# Patient Record
Sex: Male | Born: 1935 | Race: White | Hispanic: No | Smoking: Former smoker
Health system: Southern US, Community
[De-identification: ages and names within clinical notes are randomized; demographics above are authoritative.]

## PROBLEM LIST (undated history)

## (undated) DIAGNOSIS — Z9889 Other specified postprocedural states: Secondary | ICD-10-CM

## (undated) DIAGNOSIS — M1990 Unspecified osteoarthritis, unspecified site: Secondary | ICD-10-CM

## (undated) DIAGNOSIS — C801 Malignant (primary) neoplasm, unspecified: Secondary | ICD-10-CM

## (undated) DIAGNOSIS — R413 Other amnesia: Secondary | ICD-10-CM

## (undated) DIAGNOSIS — K759 Inflammatory liver disease, unspecified: Secondary | ICD-10-CM

## (undated) DIAGNOSIS — R112 Nausea with vomiting, unspecified: Secondary | ICD-10-CM

## (undated) DIAGNOSIS — C61 Malignant neoplasm of prostate: Secondary | ICD-10-CM

## (undated) DIAGNOSIS — E039 Hypothyroidism, unspecified: Secondary | ICD-10-CM

## (undated) DIAGNOSIS — K219 Gastro-esophageal reflux disease without esophagitis: Secondary | ICD-10-CM

## (undated) HISTORY — PX: HERNIA REPAIR: SHX51

## (undated) HISTORY — DX: Other amnesia: R41.3

## (undated) HISTORY — PX: JOINT REPLACEMENT: SHX530

## (undated) HISTORY — PX: COLONOSCOPY: SHX174

## (undated) HISTORY — PX: HYDROCELE EXCISION: SHX482

---

## 2003-01-05 ENCOUNTER — Ambulatory Visit (HOSPITAL_BASED_OUTPATIENT_CLINIC_OR_DEPARTMENT_OTHER): Admission: RE | Admit: 2003-01-05 | Discharge: 2003-01-05 | Payer: Self-pay | Admitting: Urology

## 2003-01-05 ENCOUNTER — Encounter: Payer: Self-pay | Admitting: Urology

## 2005-01-30 ENCOUNTER — Ambulatory Visit (HOSPITAL_COMMUNITY): Admission: RE | Admit: 2005-01-30 | Discharge: 2005-01-30 | Payer: Self-pay | Admitting: Internal Medicine

## 2005-01-30 ENCOUNTER — Encounter (INDEPENDENT_AMBULATORY_CARE_PROVIDER_SITE_OTHER): Payer: Self-pay | Admitting: Internal Medicine

## 2005-01-30 ENCOUNTER — Ambulatory Visit: Payer: Self-pay | Admitting: Internal Medicine

## 2006-01-09 ENCOUNTER — Ambulatory Visit (HOSPITAL_COMMUNITY): Admission: RE | Admit: 2006-01-09 | Discharge: 2006-01-09 | Payer: Self-pay | Admitting: Family Medicine

## 2006-12-11 ENCOUNTER — Ambulatory Visit (HOSPITAL_COMMUNITY): Admission: RE | Admit: 2006-12-11 | Discharge: 2006-12-11 | Payer: Self-pay | Admitting: Family Medicine

## 2008-10-21 ENCOUNTER — Emergency Department (HOSPITAL_COMMUNITY): Admission: EM | Admit: 2008-10-21 | Discharge: 2008-10-21 | Payer: Self-pay | Admitting: Emergency Medicine

## 2010-02-15 ENCOUNTER — Encounter (INDEPENDENT_AMBULATORY_CARE_PROVIDER_SITE_OTHER): Payer: Self-pay | Admitting: *Deleted

## 2010-07-12 NOTE — Letter (Signed)
Summary: Recall, Screening Colonoscopy Only  [facility]  [address]   [place], [place] [address]   Phone: [phone]  Fax: [phone]    February 15, 2010  Evan Miller [address] [place], [place]  [address] 12-24-1925   Dear Evan Miller,   Our records indicate it is time to schedule your colonoscopy.    Please call our office at [phone] and ask for the nurse.   Thank you,    Hendricks Limes, LPN Cloria Spring, LPN  [facility] Ph: [phone]   Fax: [phone]

## 2010-10-26 NOTE — Op Note (Signed)
Evan Miller, Evan Miller                       ACCOUNT NO.:  [id number]   MEDICAL RECORD NO.:  [id number]                   PATIENT TYPE:  AMB   LOCATION:  [facility]                                 FACILITY:  [facility]   PHYSICIAN:  Valetta Fuller, M.D.               DATE OF BIRTH:  09-Jun-1926   DATE OF PROCEDURE:  01/05/2003  DATE OF DISCHARGE:                                 OPERATIVE REPORT   PREOPERATIVE DIAGNOSIS:  Left hydrocele.   POSTOPERATIVE DIAGNOSIS:  Left hydrocele.   PROCEDURE:  Left hydrocelectomy.   SURGEON:  Valetta Fuller, M.D.   ASSISTANT:  Susanne Borders, MD   ANESTHESIA:  General endotracheal.   ESTIMATED BLOOD LOSS:  Minimal.   COMPLICATIONS:  None.   INDICATION FOR PROCEDURE:  Mr. Needs is a 77 year old with a history of  right hydrocele, status post right hydrocelectomy several years prior.  He  was noted at that time to have a very small left hydrocele; however, this  hydrocele has increased markedly in size over the past few years and is now  bothersome to the patient.  He wishes to undergo left hydrocelectomy for  repair of this problem.   DESCRIPTION OF PROCEDURE:  The patient was brought to the operating room and  correctly identified by his identification bracelet.  He was given  preoperative antibiotics and placed in the supine position.  He was shaved,  prepped, and draped in typical sterile fashion.  The skin knife was used to  make an incision in the median raphe.  The Bovie electrocautery was used to  dissect through the layers of the scrotum down to the tunica vaginalis.  With blunt dissection and using the surgeon's finger, the space between the  tunica and the scrotal layers was created, allowing the large hydrocele to  be delivered through the scrotal wound.  The tunica vaginalis was then  incised with the knife and the fluid contents removed with a sucker.  There  was much redundancy of the tunica vaginalis, and some of this tissue was  excised with Bovie electrocautery.  The tunica was then wrapped around the  scrotum and tacked in place such that the potential space for hydrocele  fluid collect was no longer present.  The dartos tissue of the scrotum was  then carefully inspected for sites of bleeding, and these were all  coagulated meticulously.  Once no further bleeding was noted, the testicle  was placed back within the scrotum.  The dartos tissue was closed with a  running 4-0 Vicryl, and the skin was also closed with subcuticular running 4-  0 Vicryl.  A Tegaderm was applied, and the patient was awakened without  complications.  He was taken to the postanesthesia care unit in stable  condition.  Valetta Fuller, M.D. was the primary surgeon and participated  in all aspects of this case.    Susanne Borders, MD  Valetta Fuller, M.D.   DR/MEDQ  D:  01/05/2003  T:  01/05/2003  Job:  [id number]

## 2010-10-26 NOTE — Op Note (Signed)
NAMEHASAN, DOUSE             ACCOUNT NO.:  [id number]   MEDICAL RECORD NO.:  [id number]          PATIENT TYPE:  AMB   LOCATION:  DAY                           FACILITY:  [facility]   PHYSICIAN:  Lionel December, M.D.    DATE OF BIRTH:  [birth year]   DATE OF PROCEDURE:  01/30/2005  DATE OF DISCHARGE:                                 OPERATIVE REPORT   PROCEDURE:  Colonoscopy.   INDICATION:  Mr. Dobie is a 77 year old Caucasian male who is here for  screening colonoscopy. Family history is negative for colorectal carcinoma.  Procedure and risks were reviewed with the patient, and informed consent was  obtained.   PREMEDICATION:  Demerol 25 mg IV, Versed 2 mg IV.   FINDINGS:  Procedure performed in endoscopy suite. The patient's vital signs  and O2 saturation were monitored during the procedure and remained stable.  The patient was placed in left lateral position. Rectal examination  performed. No abnormality noted on external or digital exam. Olympus  videoscope was placed in rectum and advanced under vision into sigmoid colon  where a few small scattered diverticula were noted. Preparation was  satisfactory. Scope was passed into hepatic flexure which was quite  redundant, but turning the patient on his back and using pressure, I was  able to pass the scope into cecum which was identified by appendiceal  orifice and ileocecal valve. Pictures taken for the record. As the scope was  withdrawn, colonic mucosa was once again carefully examined. There was a  single polyp just above ileocecal valve on a fold which was ablated via cold  biopsy. Mucosa of the rest of the colon was normal. Rectal mucosa similarly  was normal. Scope was retroflexed to examine anorectal junction ,and small  hemorrhoids were noted below the dentate line. Endoscope was straightened  and withdrawn. The patient tolerated the procedure well.   FINAL DIAGNOSIS:  1.  Small polyp ablated via cold biopsy from the  ascending colon.  2.  Few diverticula at sigmoid colon.  3.  Small external hemorrhoids.   RECOMMENDATIONS:  1.  High-fiber diet. Citrucel 1 tablespoonful daily if he does not stay on a      high-fiber diet.  2.  He can resume his aspirin today.  3.  I will be contacting the patient with biopsy results and further      recommendations.      Lionel December, M.D.  Electronically Signed     NR/MEDQ  D:  01/30/2005  T:  01/30/2005  Job:  [id number]   cc:   Angus G. Renard Matter, MD  [address]  [place]  [place] [address]  Fax: [phone]

## 2010-10-29 ENCOUNTER — Ambulatory Visit (HOSPITAL_COMMUNITY)
Admission: RE | Admit: 2010-10-29 | Discharge: 2010-10-29 | Disposition: A | Discharge status: Home / Self Care | Payer: Medicare Other | Source: Ambulatory Visit | Attending: Family Medicine | Admitting: Family Medicine

## 2010-10-29 ENCOUNTER — Other Ambulatory Visit (HOSPITAL_COMMUNITY): Payer: Self-pay | Admitting: Family Medicine

## 2010-10-29 DIAGNOSIS — M1990 Unspecified osteoarthritis, unspecified site: Secondary | ICD-10-CM

## 2010-10-29 DIAGNOSIS — M1710 Unilateral primary osteoarthritis, unspecified knee: Secondary | ICD-10-CM | POA: Insufficient documentation

## 2010-10-29 DIAGNOSIS — M25569 Pain in unspecified knee: Secondary | ICD-10-CM | POA: Insufficient documentation

## 2010-10-29 DIAGNOSIS — G8929 Other chronic pain: Secondary | ICD-10-CM

## 2010-10-29 DIAGNOSIS — IMO0002 Reserved for concepts with insufficient information to code with codable children: Secondary | ICD-10-CM | POA: Insufficient documentation

## 2011-02-21 ENCOUNTER — Encounter (HOSPITAL_COMMUNITY): Payer: Medicare Other

## 2011-02-21 ENCOUNTER — Other Ambulatory Visit: Payer: Self-pay | Admitting: Orthopedic Surgery

## 2011-02-21 LAB — URINALYSIS, ROUTINE W REFLEX MICROSCOPIC
Bilirubin Urine: NEGATIVE
Glucose, UA: NEGATIVE mg/dL
Hgb urine dipstick: NEGATIVE
Ketones, ur: NEGATIVE mg/dL
Nitrite: NEGATIVE
Specific Gravity, Urine: 1.02 (ref 1.005–1.030)
pH: 5 (ref 5.0–8.0)

## 2011-02-21 LAB — COMPREHENSIVE METABOLIC PANEL
ALT: 36 U/L (ref 0–53)
AST: 34 U/L (ref 0–37)
CO2: 30 mEq/L (ref 19–32)
Calcium: 9.9 mg/dL (ref 8.4–10.5)
Chloride: 101 mEq/L (ref 96–112)
Creatinine, Ser: 0.95 mg/dL (ref 0.50–1.35)
GFR calc Af Amer: >60 mL/min (ref >60)
GFR calc non Af Amer: >60 mL/min (ref >60)
Glucose, Bld: 100 mg/dL — ABNORMAL HIGH (ref 70–99)
Sodium: 138 mEq/L (ref 135–145)
Total Bilirubin: 0.4 mg/dL (ref 0.3–1.2)

## 2011-02-21 LAB — CBC
Hemoglobin: 14.5 g/dL (ref 13.0–17.0)
MCH: 30.7 pg (ref 26.0–34.0)
MCV: 90.5 fL (ref 78.0–100.0)
RBC: 4.73 MIL/uL (ref 4.22–5.81)
WBC: 9.3 10*3/uL (ref 4.0–10.5)

## 2011-02-21 LAB — APTT: aPTT: 35 seconds (ref 24–37)

## 2011-02-21 LAB — SURGICAL PCR SCREEN: Staphylococcus aureus: NEGATIVE

## 2011-02-25 ENCOUNTER — Inpatient Hospital Stay (HOSPITAL_COMMUNITY)
Admission: RE | Admit: 2011-02-25 | Discharge: 2011-02-28 | DRG: 470 | Disposition: A | Discharge status: Skilled Nursing Facility | Payer: Medicare Other | Source: Ambulatory Visit | Attending: Orthopedic Surgery | Admitting: Orthopedic Surgery

## 2011-02-25 DIAGNOSIS — E7800 Pure hypercholesterolemia, unspecified: Secondary | ICD-10-CM | POA: Diagnosis present

## 2011-02-25 DIAGNOSIS — Z85828 Personal history of other malignant neoplasm of skin: Secondary | ICD-10-CM

## 2011-02-25 DIAGNOSIS — M21169 Varus deformity, not elsewhere classified, unspecified knee: Secondary | ICD-10-CM | POA: Diagnosis present

## 2011-02-25 DIAGNOSIS — D62 Acute posthemorrhagic anemia: Secondary | ICD-10-CM | POA: Diagnosis not present

## 2011-02-25 DIAGNOSIS — Z8546 Personal history of malignant neoplasm of prostate: Secondary | ICD-10-CM

## 2011-02-25 DIAGNOSIS — Z01812 Encounter for preprocedural laboratory examination: Secondary | ICD-10-CM

## 2011-02-25 DIAGNOSIS — B029 Zoster without complications: Secondary | ICD-10-CM | POA: Diagnosis present

## 2011-02-25 DIAGNOSIS — H409 Unspecified glaucoma: Secondary | ICD-10-CM | POA: Diagnosis present

## 2011-02-25 DIAGNOSIS — Z7982 Long term (current) use of aspirin: Secondary | ICD-10-CM

## 2011-02-25 DIAGNOSIS — Z85820 Personal history of malignant melanoma of skin: Secondary | ICD-10-CM

## 2011-02-25 DIAGNOSIS — Z880 Allergy status to penicillin: Secondary | ICD-10-CM

## 2011-02-25 DIAGNOSIS — M1710 Unilateral primary osteoarthritis, unspecified knee: Principal | ICD-10-CM | POA: Diagnosis present

## 2011-02-25 DIAGNOSIS — E039 Hypothyroidism, unspecified: Secondary | ICD-10-CM | POA: Diagnosis present

## 2011-02-25 DIAGNOSIS — Z79899 Other long term (current) drug therapy: Secondary | ICD-10-CM

## 2011-02-26 LAB — CBC
HCT: 32.7 % — ABNORMAL LOW (ref 39.0–52.0)
Hemoglobin: 11.1 g/dL — ABNORMAL LOW (ref 13.0–17.0)
MCH: 30.4 pg (ref 26.0–34.0)
MCV: 89.6 fL (ref 78.0–100.0)
RBC: 3.65 MIL/uL — ABNORMAL LOW (ref 4.22–5.81)

## 2011-02-26 LAB — BASIC METABOLIC PANEL
CO2: 27 mEq/L (ref 19–32)
Calcium: 8.5 mg/dL (ref 8.4–10.5)
Creatinine, Ser: 0.81 mg/dL (ref 0.50–1.35)
Glucose, Bld: 116 mg/dL — ABNORMAL HIGH (ref 70–99)
Sodium: 138 mEq/L (ref 135–145)

## 2011-02-27 LAB — CBC
MCH: 30.6 pg (ref 26.0–34.0)
MCV: 89.7 fL (ref 78.0–100.0)
Platelets: 233 10*3/uL (ref 150–400)
RBC: 3.89 MIL/uL — ABNORMAL LOW (ref 4.22–5.81)

## 2011-02-27 LAB — BASIC METABOLIC PANEL
CO2: 26 mEq/L (ref 19–32)
Calcium: 9.2 mg/dL (ref 8.4–10.5)
Creatinine, Ser: 0.94 mg/dL (ref 0.50–1.35)
GFR calc non Af Amer: >60 mL/min (ref >60)
Glucose, Bld: 129 mg/dL — ABNORMAL HIGH (ref 70–99)

## 2011-02-27 NOTE — Op Note (Signed)
Evan Miller, Evan Miller             ACCOUNT NO.:  [id number]  MEDICAL RECORD NO.:  [id number]  LOCATION:  1613                         FACILITY:  [facility]  PHYSICIAN:  Ollen Gross, M.D.    DATE OF BIRTH:  [birth year]  DATE OF PROCEDURE:  02/25/2011 DATE OF DISCHARGE:                              OPERATIVE REPORT   PREOPERATIVE DIAGNOSIS:  Osteoarthritis, left knee.  POSTOPERATIVE DIAGNOSIS:  Osteoarthritis, left knee.  PROCEDURE:  Left total knee arthroplasty.  SURGEON:  Ollen Gross, M.D.  ASSISTANT:  Alexzandrew L. Perkins, P.A.C.  ANESTHESIA:  Spinal.  ESTIMATED BLOOD LOSS:  Minimal.  DRAIN:  Hemovac x1.  TOURNIQUET TIME:  47 minutes at 300 mmHg.  COMPLICATIONS:  None.  CONDITION:  Stable to Recovery.  BRIEF CLINICAL NOTE:  Mr. Venuto is an 77 year old male with advanced end-stage arthritis of both knees with progressively worsening pain and dysfunction.  He has severe varus deformity of the left worse than the right knee with approximately 25 to 30 degrees of varus.  He has had intractable pain with bone-on-bone changes throughout the knee.  He has failed nonoperative management including injections and presents for total knee arthroplasty.  PROCEDURE IN DETAIL:  After successful administration of spinal anesthetic, a tourniquet was placed high on his left thigh, and his left lower extremity was prepped and draped in the usual sterile fashion. Extremities wrapped in Esmarch, knee flexed, tourniquet inflated to 300 mmHg.  Midline incision was made with 10 blade through subcutaneous tissue to the extensor mechanism.  A fresh blade was used to make a medial parapatellar arthrotomy.  Soft tissue on the proximal medial tibia subperiosteally elevated to the joint line with the knife and into the semimembranosus bursa with a Cobb elevator.  Soft tissue laterally is elevated with attention being paid to avoid patellar tendon on tibial tubercle.  Patella was everted,  knee flexed 90 degrees and ACL and PCL removed.  He has severe bony erosion on the medial tibia and medial femoral condyle.  Drill was used to create a starting hole on the distal femur and the canal was thoroughly irrigated.  5-degree left valgus alignment guide was placed and the distal femoral cutting block was pinned to remove 11 mm of the distal femur.  Resection was made with an oscillating saw.  Sizing guide was placed, size 5 was most appropriate. Rotations marked at the epicondylar axis and a size 5 cutting block was placed in this rotation.  The anterior, posterior, and chamfer cuts were made.  The tibia subluxed forward.  There was barely any meniscus left and what was left was removed.  There was a massive rim of osteophytes on the medial tibia, which was excised.  He has got about a 10 to 15 mm defect on the medial side.  The extramedullary tibial cutting guide was placed referencing proximally at the medial aspect of the tibial tubercle and distally along the second metatarsal axis and tibial crest.  Block is pinned to remove about 6 mm off the slightly deficient lateral side. Tibial resection was made with an oscillating saw.  There is still a massive defect medially.  I felt it would be about a centimeter defect to  get to the base of it.  I made the cut on the medial side, a centimeter below the main level of resection in order to get to the bottom of this defect.  We then prepared the proximal tibia to modular drill and keel punch for the size 5.  I prepared for the MBT revision tray with a 13 x 30 stem extension.  We then placed a trial size 5 MBT tibial with a 13 x 30 stem extension, a 10-mm medial sided augment. This had perfect fit on the cut bone surfaces.  We then finished preparation with the keel punch.  The intercondylar block was then used to complete preparation of the femoral side and the resection was made with an oscillating saw.  Trial size 5 posterior  stabilized femur was placed.  A 10-mm posterior stabilized rotating platform insert trial was placed with the 10 full extensions achieved with excellent varus-valgus and anterior-posterior balance throughout full range of motion.  The patella was everted, then the thickness measured to be 26 mm.  Freehand resection was taken 14 mm, 41 template was placed, lug holes were drilled, trial patella was placed and it tracks normally.  Osteophytes removed off the posterior femur with the trial in place.  All trials were removed.  We sized for a size 5 cement restrictor and placed in the tibial canal.  Cement was mixed and once ready for implantation, these placed into tibial canal and a tibial component is impacted and all extruded cement removed.  Femoral component and patellar component was cemented in place.  Patella was held with a clamp.  Trial 10-mm inserts placed, knee held in full extension, all extruded cement removed.  When the cement fully hardened and the permanent 10-mm posterior stabilized, rotating platform insert was placed into the tibial tray.  The wound was copiously irrigated with saline solution and the arthrotomy closed over Hemovac drain with interrupted #1 PDS.  Flexion against gravity to about a 135 degrees and the patella tracks normally.  Tourniquet released, total time of 47 minutes.  Subcu was closed with interrupted 2-0 Vicryl and subcuticular running 4-0 Monocryl.  Note that tibial component with a size 5 MBT revision tray with a 10-mm augment medially.  Once they are closed and the catheter for Marcaine pain pump was placed and pumps initiated.  Incisions cleaned and dried and Steri-Strips and bulky sterile dressing applied.  He is then placed into a knee immobilizer, awakened and transferred to Recovery in stable condition.  Please note that it was a medical necessity to have a surgical assistant for this operation in order to perform it in a safe and  expeditious manner.  Surgical assistant was necessary for retraction of vital structures and ligaments as well as proper positioning of the leg to gain ideal alignment of the implants.     Ollen Gross, M.D.     FA/MEDQ  D:  02/25/2011  T:  02/25/2011  Job:  [id number]  Electronically Signed by Ollen Gross M.D. on 02/27/2011 10:09:41 AM

## 2011-02-28 ENCOUNTER — Inpatient Hospital Stay
Admission: RE | Admit: 2011-02-28 | Discharge: 2011-03-15 | Disposition: A | Discharge status: Home / Self Care | Payer: Medicare Other | Source: Ambulatory Visit | Attending: Family Medicine | Admitting: Family Medicine

## 2011-02-28 LAB — CBC
MCH: 30.8 pg (ref 26.0–34.0)
MCV: 90.2 fL (ref 78.0–100.0)
Platelets: 202 10*3/uL (ref 150–400)
RDW: 13.2 % (ref 11.5–15.5)
WBC: 8.3 10*3/uL (ref 4.0–10.5)

## 2011-03-06 NOTE — Discharge Summary (Signed)
[name], [name]             ACCOUNT NO.:  [id number]  MEDICAL RECORD NO.:  [id number]  LOCATION:  1613                         FACILITY:  [facility]  PHYSICIAN:  Ollen Gross, M.D.    DATE OF BIRTH:  1926-02-15  DATE OF ADMISSION:  02/25/2011 DATE OF DISCHARGE:  02/28/2011                        DISCHARGE SUMMARY - REFERRING   ADMITTING DIAGNOSES: 1. Osteoarthritis, left knee. 2. History of prostate cancer. 3. Hypercholesterolemia. 4. Hypothyroidism. 5. Shingles. 6. Glaucoma. 7. History of pilonidal cyst.  DISCHARGE DIAGNOSES: 1. Osteoarthritis, left knee status post left total knee replacement     arthroplasty. 2. Mild postop acute blood loss anemia, did not require transfusion. 3. History of prostate cancer. 4. Hypercholesterolemia. 5. Hypothyroidism. 6. Shingles. 7. Glaucoma. 8. History of pilonidal cyst.  PROCEDURE:  February 25, 2011, left total knee, surgeon Dr. Lequita Halt, assistant Avel Peace PA-C, spinal anesthesia, tourniquet time 47 minutes.  CONSULTS:  None.  BRIEF HISTORY:  Evan Miller is an 77 year old male with advanced arthritis of both knees with progressive worsening pain and dysfunction, has severe varus deformity of the left worse than the right knee; approximately at 25-30 degrees of varus, he has intractable pain with bone-on-bone changes throughout, failed nonoperative management now including injection and now presents for total knee.  LABORATORY DATA:  Preop CBC showed hemoglobin of 14.5, hematocrit of 42.8, white cell count 9.3, platelets of 261.  PT/INR 13.41 with PTT of 35.  Chem-panel on admission all within normal limits.  Preop UA negative.  Blood group type A+.  Nasal swabs were negative.  Staph aureus negative for MRSA.  Serial CBCs were followed.  Hemoglobin dropped down to 11.1.  Last H and H down to 10 with a crit of 29.3. White count went up from 9.3 to 13.9, back down to 8.3.  Serial BMETs were followed for 48 hours.   Electrolytes have all remained within normal limits.  X-rays; right knee films on Oct 29, 2010 showed right knee tricompartmental osteoarthritis.  Left knee films taken on Oct 29, 2010 showed advanced arthritic changes of the left knee with questionable nondisplaced fracture with spurs at the medial margin of the medial tibial plateau.  HOSPITAL COURSE:  The patient admitted to [facility], taken to OR, underwent above-stated procedure without complication.  The patient tolerated the procedure well, later transferred to the recovery room in orthopedic floor, started on p.o. and IV analgesics for pain control following surgery, given 24 hours postop IV antibiotics.  Started on Xarelto for deep venous thrombosis prophylaxis.  Doing pretty well on the morning of day 1.  He knew he wanted to look into the [facility] for skilled facility, so we got social work involved postoperatively. He had good urine output.  Hemovac drain placed during surgery pulled without difficulty.  Started to get up out of bed on day 1 and actually was walking over 70 feet, doing very well.  Pain was well-controlled. By day 2, we changed the dressing.  He was still progressing well, walking over 70 feet.  Incision looked good.  His hemoglobin was 11.9, he was asymptomatic with this.  It was felt that if he continued well, he would be ready by the following  day.  He was seen on rounds on February 28, 2011 on postop day 3 by Dr. Lequita Halt.  The patient was doing well, no complaints, it was felt the patient would be transferred over to [facility] at that time.  DISCHARGE PLAN:  The patient will be transferred to [facility] on February 28, 2011.  DISCHARGE DIAGNOSES:  Please see above.  DISCHARGE MEDICATIONS:  Medications at the time of transfer include: 1. Xarelto 10 mg p.o. daily for 3 weeks and discontinue the Xarelto. 2. Colace 100 mg p.o. b.i.d. 3. Levothyroxine 100 mcg p.o. q.h.s. 4. Robaxin  500 mg p.o. q.6-8 hours p.r.n. spasm. 5. OxyIR 5 mg one or two tablets every 4-6 hours as needed for     moderate pain. 6. Tylenol 325 one or two every 4-6 hours as needed for mild pain,     temperature, or headache. 7. Laxative of choice. 8. Enema of choice.  DIET:  Heart-healthy diet.  ACTIVITY:  He is weightbearing as tolerated to the left lower extremity. PT and OT for gait training, ambulation, ADLs, range of motion and strengthening exercises for total knee protocol.  FOLLOWUP:  He needs to follow up with Dr. Lequita Halt in office in 2 weeks from date of surgery.  Please contact the office of [phone].  DISPOSITION:  [facility].  CONDITION UPON DISCHARGE:  Improving.     Evan Miller, P.A.C.   ______________________________ Ollen Gross, M.D.    ALP/MEDQ  D:  02/28/2011  T:  02/28/2011  Job:  [id number]  cc:   Valetta Fuller, MD Fax: [phone]  Angus G. Renard Matter, MD Fax: [phone]  Electronically Signed by Patrica Duel P.A.C. on 02/28/2011 11:37:06 AM Electronically Signed by Ollen Gross M.D. on 03/06/2011 10:33:21 AM

## 2011-03-06 NOTE — H&P (Signed)
Evan Miller, Evan Miller             ACCOUNT NO.:  [id number]  MEDICAL RECORD NO.:  [id number]  LOCATION:                               FACILITY:  [facility]  PHYSICIAN:  Ollen Gross, M.D.    DATE OF BIRTH:  09/04/1925  DATE OF ADMISSION:  02/25/2011 DATE OF DISCHARGE:                             HISTORY & PHYSICAL   CHIEF COMPLAINT:  Left knee pain.  HISTORY OF PRESENT ILLNESS:  The patient is an 77 year old male who has been seen by Dr. Lequita Halt for ongoing bilateral knee pain, the left is more symptomatic and problematic than the right.  It has gotten worse over the past year or two.  He is known to have increasing deformity. He is found in the office to have x-rays that show severe end-stage arthritis of both knees with bone on bone.  It has progressively gotten worse with time and it is interfering with his mobility and impacting his function.  It is felt he would benefit from undergoing surgical intervention.  Risks and benefits have been discussed, he elected to proceed with surgery.  He did not have any contraindications such as ongoing infection or progressive neurological disease.  ALLERGIES: 1. PENICILLIN causes welts. 2. SULFA causes the patient to break out.  CURRENT MEDICATIONS:  Levothyroxine, aspirin, diclofenac, vitamin supplement.  PAST MEDICAL HISTORY: 1. History of prostate cancer. 2. Hypercholesterolemia. 3. Hypothyroidism. 4. Shingles. 5. Glaucoma. 6. History of pilonidal cyst.  PAST SURGICAL HISTORY:  Removal of skin cancer of melanoma and basal cell skin cancers, pilonidal cyst excision, and dental extractions.  FAMILY HISTORY:  Father deceased at age 61 from heart disease.  Mother deceased at age 19.  Also family history of prostate cancer.  SOCIAL HISTORY:  Married, 3 children.  Quit smoking about 45 years ago. No alcohol.  He is retired from Systems analyst work.  REVIEW OF SYSTEMS:  GENERAL:  No fever, chills, night sweats.  NEURO: No seizure,  syncope, or paralysis.  RESPIRATORY:  No shortness of breath, productive cough, or hemoptysis.  CARDIOVASCULAR:  No chest pain, orthopnea.  GI:  No nausea, vomiting, diarrhea, or constipation. GU:  No dysuria, hematuria, or discharge.  MUSCULOSKELETAL:  Knee pain.  PHYSICAL EXAMINATION:  VITAL SIGNS:  Pulse 64, respirations 12, blood pressure 124/72 in the right arm. GENERAL:  An 77 year old, white male well nourished and well developed, no acute distress.  He is alert, oriented, and cooperative, accompanied by his wife and daughter.  His daughter is an Charity fundraiser. HEENT:  Normocephalic, atraumatic.  Pupils round, reactive.  Notable to wear glasses.  EOMs intact. NECK:  Supple with some limited extension of his neck.  No bruits appreciated. CHEST:  Clear. HEART:  Regular rate and rhythm without murmur, S1 and S2 noted. ABDOMEN:  Soft, nontender.  Bowel sounds present. RECTAL/BREAST/GENITALIA:  Not done, not pertinent to present illness. EXTREMITIES:  Left knee, severe varus deformity.  Range of motion 10 to 110, marked crepitus, no instability.  Right knee, severe varus deformity, range of motion 5 to 110, marked crepitus, no instability.  IMPRESSION:  Osteoarthritis, left knee.  PLAN:  The patient will be admitted to [facility] to undergo a left total knee  replacement arthroplasty.  Surgery will be performed by Dr. Ollen Gross.     Alexzandrew L. Julien Girt, P.A.C.   ______________________________ Ollen Gross, M.D.    ALP/MEDQ  D:  02/24/2011  T:  02/24/2011  Job:  [id number]  cc:   Angus G. Renard Matter, MD Fax: [phone]  Valetta Fuller, MD Fax: [phone]  Electronically Signed by Patrica Duel P.A.C. on 02/28/2011 11:31:41 AM Electronically Signed by Ollen Gross M.D. on 03/06/2011 10:33:18 AM

## 2011-06-06 ENCOUNTER — Encounter (HOSPITAL_COMMUNITY): Payer: Self-pay | Admitting: Pharmacy Technician

## 2011-06-15 ENCOUNTER — Other Ambulatory Visit: Payer: Self-pay | Admitting: Orthopedic Surgery

## 2011-06-15 NOTE — H&P (Signed)
Jana Half  DOB: 06/16/1925 Single / Race: White / Male  Date of Admission:  06/24/2011  Chief Complaint:  Right Knee Pain  History of Present Illness The patient is a 78 year old male who comes in today for a preoperative History and Physical. The patient is scheduled for a right total knee arthroplasty to be performed by Dr. Gus Rankin. Aluisio, MD at [facility] on 06/24/2011. Mr. Hohensee feels like the left knee is doing great. He states it is the best it has felt in quite a while. The big problem he is having with ambulating is his right knee. He has known advanced arthritis in the right knee also. He feels like that is holding him back a little. They have been treated conservatively in the past for the above stated problem and despite conservative measures, they continue to have progressive pain and severe functional limitations and dysfunction. It is felt that they would benefit from undergoing total joint replacement. He has done well with his left knee surgery. Risks and benefits of the procedure have been discussed with the patient and they elect to proceed with surgery. There are no active contraindications to surgery such as ongoing infection or rapidly progressive neurological disease.  Allergies Penicillins. Hives. Sulfanomides. Rash.  Problem List/Past Medical History of Prostate Cancer Hypothyroidism Tinnitus History of Shingles Glaucoma Osteoarthrosis NOS, lower leg (715.96) Hypercholesterolemia Impaired Hearing Tinnitus Shingles. Past History Cataract Enlarged prostate Skin Cancer  Past Surgical History Dental Extraction Surgery Skin Cancer Surgery (Basal Cell and Melanoma) Pilonidal Cyst Surgery Total Knee Replacement - Left  Family History Heart Disease. father Father. Deceased, Heart disease. Age 10 Mother. Deceased. Age 39 Cancer. Prostate Cancer  Social History Alcohol use. current drinker; drinks beer and hard  liquor; only occasionally per week Children. 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 Exercises daily; does running / walking Illicit drug use. no Living situation. Lives with spouse. live with spouse Marital status. Married. married Number of flights of stairs before winded. 2-3 Pain Contract. no Previously in rehab. no Tobacco use. Former smoker. Quit 45 years ago former smoker; smoke(d) 1/2 pack(s) per day No alcohol use  Review of Systems General:Not Present- Chills, Fever, Night Sweats, Appetite Loss, Fatigue, Feeling sick, Weight Gain and Weight Loss. Skin:Not Present- Itching, Rash, Skin Color Changes, Ulcer, Psoriasis and Change in Hair or Nails. HEENT:Present- Tinnitus, Hearing Loss and Decreased Hearing. Not Present- Sensitivity to light, Nose Bleed, Visual Loss and Ringing in the Ears. Neck:Not Present- Swollen Glands and Neck Mass. Respiratory:Not Present- Shortness of breath, Snoring, Chronic Cough and Bloody sputum. Cardiovascular:Not Present- Shortness of Breath, Chest Pain, Swelling of Extremities, Leg Cramps and Palpitations. Gastrointestinal:Present- Heartburn. Not Present- Bloody Stool, Abdominal Pain, Vomiting, Nausea and Incontinence of Stool. Male Genitourinary:Present- Urinary Retention. Not Present- Blood in Urine, Frequency, Incontinence and Nocturia. Musculoskeletal:Present- Joint Stiffness, Joint Swelling, Back Pain and Morning Stiffness. Not Present- Muscle Weakness, Muscle Pain and Joint Pain. Neurological:Not Present- Tingling, Numbness, Burning, Tremor, Headaches and Dizziness. Psychiatric:Not Present- Anxiety, Depression and Memory Loss. Endocrine:Not Present- Cold Intolerance, Heat Intolerance, Excessive hunger and Excessive Thirst. Hematology:Not Present- Abnormal Bleeding, Abnormal bruising, Anemia and Blood Clots.  Vitals Weight: 183 lb Height: 64 in Body Surface  Area: 1.94 m Body Mass Index: 31.41 kg/m Pulse: 76 (Regular) Resp.: 14 (Unlabored) BP: 120/68 (Sitting, Left Arm, Standard)  Physical Exam The physical exam findings are as follows:  General Mental Status - Alert, cooperative and good historian. General  Appearance- pleasant. Not in acute distress. Orientation- Oriented X3. Build & Nutrition- Well nourished and Well developed.  Head and Neck Head- normocephalic, atraumatic . Neck Global Assessment- supple. no bruit auscultated on the right and no bruit auscultated on the left.  Eye Pupil- Bilateral- Regular and Round. Motion- Bilateral- EOMI. Wears glasses  Chest and Lung Exam Auscultation: Breath sounds:- clear at anterior chest wall and - clear at posterior chest wall. Adventitious sounds:- No Adventitious sounds.  Cardiovascular Auscultation:Rhythm- Regular rate and rhythm. Heart Sounds- S1 WNL and S2 WNL. Murmurs & Other Heart Sounds: Murmur 1:Location- Sternal Border - Left. Timing- Mid-systolic. Grade- II/VI. Character- Crescendo/Decrescendo.  Abdomen Palpation/Percussion:Tenderness- Abdomen is non-tender to palpation. Rigidity (guarding)- Abdomen is soft. Auscultation:Auscultation of the abdomen reveals - Bowel sounds normal.  Male Genitourinary Not done, not pertinent to present illness  Musculoskeletal Evaluation of his left knee shows a well healed incision with no erythema or exudate. He has range of motion of that left knee from about 10 to 106 actively, 7 to 110 passively. His right knee shows no effusion. He has a significant varus deformity. He is tender over the medial greater than lateral knee. There is no instability about the right knee.  Assessment & Plan Osteoarthritis Right Knee  Patient is for a Right Total Knee Replacement per Dr. Ollen Gross.  He went to [facility] following his previous left total knee and will either look into that  facility again versus going home if doing very well following his surgery.  Avel Peace, PA-C

## 2011-06-17 ENCOUNTER — Encounter (HOSPITAL_COMMUNITY)
Admission: RE | Admit: 2011-06-17 | Discharge: 2011-06-17 | Disposition: A | Discharge status: Home / Self Care | Payer: Medicare Other | Source: Ambulatory Visit | Attending: Orthopedic Surgery | Admitting: Orthopedic Surgery

## 2011-06-17 ENCOUNTER — Encounter (HOSPITAL_COMMUNITY): Payer: Self-pay

## 2011-06-17 HISTORY — DX: Hypothyroidism, unspecified: E03.9

## 2011-06-17 HISTORY — DX: Unspecified osteoarthritis, unspecified site: M19.90

## 2011-06-17 HISTORY — DX: Inflammatory liver disease, unspecified: K75.9

## 2011-06-17 HISTORY — DX: Other specified postprocedural states: Z98.890

## 2011-06-17 HISTORY — DX: Malignant (primary) neoplasm, unspecified: C80.1

## 2011-06-17 HISTORY — DX: Nausea with vomiting, unspecified: R11.2

## 2011-06-17 HISTORY — DX: Gastro-esophageal reflux disease without esophagitis: K21.9

## 2011-06-17 LAB — CBC
MCH: 27.7 pg (ref 26.0–34.0)
MCHC: 32.9 g/dL (ref 30.0–36.0)
Platelets: 297 10*3/uL (ref 150–400)
RDW: 15.2 % (ref 11.5–15.5)

## 2011-06-17 LAB — URINALYSIS, ROUTINE W REFLEX MICROSCOPIC
Hgb urine dipstick: NEGATIVE
Protein, ur: NEGATIVE mg/dL
Urobilinogen, UA: 0.2 mg/dL (ref 0.0–1.0)

## 2011-06-17 LAB — TYPE AND SCREEN
ABO/RH(D): A POS
Antibody Screen: NEGATIVE

## 2011-06-17 LAB — COMPREHENSIVE METABOLIC PANEL
ALT: 13 U/L (ref 0–53)
AST: 17 U/L (ref 0–37)
Albumin: 4 g/dL (ref 3.5–5.2)
Calcium: 9.8 mg/dL (ref 8.4–10.5)
GFR calc Af Amer: >90 mL/min (ref >90)
Sodium: 139 mEq/L (ref 135–145)
Total Protein: 7.2 g/dL (ref 6.0–8.3)

## 2011-06-17 LAB — APTT: aPTT: 36 seconds (ref 24–37)

## 2011-06-17 LAB — SURGICAL PCR SCREEN: MRSA, PCR: NEGATIVE

## 2011-06-17 MED ORDER — CHLORHEXIDINE GLUCONATE 4 % EX LIQD: 60.0000 mL | Freq: Once | CUTANEOUS | Status: DC

## 2011-06-17 NOTE — Patient Instructions (Signed)
[address] [place]  06/17/2011   Your procedure is scheduled on: 06/23/10  Monday    Surgery [id number] Report to [facility] at 0725 AM.  Call this number if you have problems the morning of surgery: [phone]    Or Deliah Goody [phone]   Remember:   Do not eat food:After Midnight. Sunday night  May have clear liquids:until Midnight .Sunday night  Clear liquids include soda, tea, black coffee, apple or grape juice, broth.  Take these medicines the morning of surgery with A SIP OF WATER    Synthroid with sip water       Vicodin if needed with sip water  Do not wear jewelry, make-up or nail polish.  Do not wear lotions, powders, or perfumes. You may wear deodorant.  Do not shave 48 hours prior to surgery.  Do not bring valuables to the hospital.  Contacts, dentures or bridgework may not be worn into surgery.  Leave suitcase in the car. After surgery it may be brought to your room.  For patients admitted to the hospital, checkout time is 11:00 AM the day of discharge.   Patients discharged the day of surgery will not be allowed to drive home.  Name and phone number of your driver: son, daughter or rehab Special Instructions: CHG Shower Use Special Wash: 1/2 bottle night before surgery and 1/2 bottle morning of surgery. Regular soap face and privates.  May shave face AM of surgery   Please read over the following fact sheets that you were given: MRSA Information

## 2011-06-23 MED ORDER — BUPIVACAINE 0.25 % ON-Q PUMP SINGLE CATH 300ML
300.0000 mL | INJECTION | Status: DC
  Administered 2011-06-24: 300 mL
  Filled 2011-06-23: qty 300

## 2011-06-24 ENCOUNTER — Encounter (HOSPITAL_COMMUNITY): Payer: Self-pay | Admitting: *Deleted

## 2011-06-24 ENCOUNTER — Encounter (HOSPITAL_COMMUNITY): Payer: Self-pay | Admitting: Anesthesiology

## 2011-06-24 ENCOUNTER — Inpatient Hospital Stay (HOSPITAL_COMMUNITY)
Admission: RE | Admit: 2011-06-24 | Discharge: 2011-06-27 | DRG: 470 | Disposition: A | Discharge status: Skilled Nursing Facility | Payer: Medicare Other | Source: Ambulatory Visit | Attending: Orthopedic Surgery | Admitting: Orthopedic Surgery

## 2011-06-24 ENCOUNTER — Encounter (HOSPITAL_COMMUNITY)
Admission: RE | Disposition: A | Discharge status: Skilled Nursing Facility | Payer: Self-pay | Source: Ambulatory Visit | Attending: Orthopedic Surgery

## 2011-06-24 ENCOUNTER — Other Ambulatory Visit: Payer: Self-pay

## 2011-06-24 ENCOUNTER — Inpatient Hospital Stay (HOSPITAL_COMMUNITY): Payer: Medicare Other | Admitting: Anesthesiology

## 2011-06-24 DIAGNOSIS — E039 Hypothyroidism, unspecified: Secondary | ICD-10-CM | POA: Diagnosis present

## 2011-06-24 DIAGNOSIS — M1710 Unilateral primary osteoarthritis, unspecified knee: Principal | ICD-10-CM | POA: Diagnosis present

## 2011-06-24 DIAGNOSIS — Z01812 Encounter for preprocedural laboratory examination: Secondary | ICD-10-CM

## 2011-06-24 DIAGNOSIS — Z96659 Presence of unspecified artificial knee joint: Secondary | ICD-10-CM

## 2011-06-24 DIAGNOSIS — M179 Osteoarthritis of knee, unspecified: Secondary | ICD-10-CM | POA: Diagnosis present

## 2011-06-24 HISTORY — PX: TOTAL KNEE ARTHROPLASTY: SHX125

## 2011-06-24 SURGERY — ARTHROPLASTY, KNEE, TOTAL
Anesthesia: Spinal | Site: Knee | Laterality: Right | Wound class: Clean

## 2011-06-24 MED ORDER — PROPOFOL 10 MG/ML IV EMUL
INTRAVENOUS | Status: DC | PRN
  Administered 2011-06-24: 50 ug/kg/min via INTRAVENOUS

## 2011-06-24 MED ORDER — METOCLOPRAMIDE HCL 5 MG/ML IJ SOLN: 5.0000 mg | Freq: Three times a day (TID) | INTRAMUSCULAR | Status: DC | PRN

## 2011-06-24 MED ORDER — VANCOMYCIN HCL IN DEXTROSE 1-5 GM/200ML-% IV SOLN
1000.0000 mg | Freq: Once | INTRAVENOUS | Status: AC
  Administered 2011-06-24: 1000 mg via INTRAVENOUS

## 2011-06-24 MED ORDER — BISACODYL 10 MG RE SUPP
10.0000 mg | Freq: Every day | RECTAL | Status: DC | PRN
  Filled 2011-06-24: qty 1

## 2011-06-24 MED ORDER — MENTHOL 3 MG MT LOZG: 1.0000 | LOZENGE | OROMUCOSAL | Status: DC | PRN

## 2011-06-24 MED ORDER — ACETAMINOPHEN 650 MG RE SUPP: 650.0000 mg | Freq: Four times a day (QID) | RECTAL | Status: DC | PRN

## 2011-06-24 MED ORDER — FENTANYL CITRATE 0.05 MG/ML IJ SOLN
INTRAMUSCULAR | Status: DC | PRN
  Administered 2011-06-24 (×2): 50 ug via INTRAVENOUS

## 2011-06-24 MED ORDER — SCOPOLAMINE 1 MG/3DAYS TD PT72
MEDICATED_PATCH | TRANSDERMAL | Status: DC | PRN
  Administered 2011-06-24: 1 via TRANSDERMAL

## 2011-06-24 MED ORDER — FLEET ENEMA 7-19 GM/118ML RE ENEM: 1.0000 | ENEMA | Freq: Once | RECTAL | Status: AC | PRN

## 2011-06-24 MED ORDER — DOCUSATE SODIUM 100 MG PO CAPS
100.0000 mg | ORAL_CAPSULE | Freq: Two times a day (BID) | ORAL | Status: DC
  Administered 2011-06-25 – 2011-06-27 (×4): 100 mg via ORAL
  Filled 2011-06-24 (×7): qty 1

## 2011-06-24 MED ORDER — HYDROMORPHONE HCL PF 1 MG/ML IJ SOLN
0.5000 mg | INTRAMUSCULAR | Status: DC | PRN
  Administered 2011-06-24: 1 mg via INTRAVENOUS
  Administered 2011-06-24: 0.5 mg via INTRAVENOUS
  Administered 2011-06-25: 1 mg via INTRAVENOUS
  Filled 2011-06-24 (×3): qty 1

## 2011-06-24 MED ORDER — VANCOMYCIN HCL IN DEXTROSE 1-5 GM/200ML-% IV SOLN
1000.0000 mg | Freq: Two times a day (BID) | INTRAVENOUS | Status: AC
  Administered 2011-06-24: 1000 mg via INTRAVENOUS
  Filled 2011-06-24 (×3): qty 200

## 2011-06-24 MED ORDER — DEXTROSE-NACL 5-0.9 % IV SOLN
INTRAVENOUS | Status: DC
  Administered 2011-06-24 – 2011-06-25 (×4): via INTRAVENOUS

## 2011-06-24 MED ORDER — LACTATED RINGERS IV SOLN
INTRAVENOUS | Status: DC
  Administered 2011-06-24: 1000 mL via INTRAVENOUS
  Administered 2011-06-24: 11:00:00 via INTRAVENOUS

## 2011-06-24 MED ORDER — ONDANSETRON HCL 4 MG/2ML IJ SOLN
INTRAMUSCULAR | Status: DC | PRN
  Administered 2011-06-24: 4 mg via INTRAVENOUS

## 2011-06-24 MED ORDER — PHENOL 1.4 % MT LIQD: 1.0000 | OROMUCOSAL | Status: DC | PRN

## 2011-06-24 MED ORDER — TEMAZEPAM 15 MG PO CAPS: 15.0000 mg | ORAL_CAPSULE | Freq: Every evening | ORAL | Status: DC | PRN

## 2011-06-24 MED ORDER — ACETAMINOPHEN 10 MG/ML IV SOLN
INTRAVENOUS | Status: DC | PRN
  Administered 2011-06-24: 1000 mg via INTRAVENOUS

## 2011-06-24 MED ORDER — BUPIVACAINE IN DEXTROSE 0.75-8.25 % IT SOLN
INTRATHECAL | Status: DC | PRN
  Administered 2011-06-24: 10 mg via INTRATHECAL

## 2011-06-24 MED ORDER — RIVAROXABAN 10 MG PO TABS
10.0000 mg | ORAL_TABLET | Freq: Every day | ORAL | Status: DC
  Administered 2011-06-25 – 2011-06-27 (×3): 10 mg via ORAL
  Filled 2011-06-24 (×3): qty 1

## 2011-06-24 MED ORDER — METHOCARBAMOL 100 MG/ML IJ SOLN
500.0000 mg | Freq: Four times a day (QID) | INTRAVENOUS | Status: DC | PRN
  Administered 2011-06-24: 500 mg via INTRAVENOUS
  Filled 2011-06-24 (×2): qty 5

## 2011-06-24 MED ORDER — ACETAMINOPHEN 10 MG/ML IV SOLN
1000.0000 mg | Freq: Four times a day (QID) | INTRAVENOUS | Status: AC
  Administered 2011-06-24 – 2011-06-25 (×4): 1000 mg via INTRAVENOUS
  Filled 2011-06-24 (×5): qty 100

## 2011-06-24 MED ORDER — HYDROMORPHONE HCL PF 1 MG/ML IJ SOLN: 0.2500 mg | INTRAMUSCULAR | Status: DC | PRN

## 2011-06-24 MED ORDER — METOCLOPRAMIDE HCL 10 MG PO TABS: 5.0000 mg | ORAL_TABLET | Freq: Three times a day (TID) | ORAL | Status: DC | PRN

## 2011-06-24 MED ORDER — METHOCARBAMOL 500 MG PO TABS: 500.0000 mg | ORAL_TABLET | Freq: Four times a day (QID) | ORAL | Status: DC | PRN

## 2011-06-24 MED ORDER — BUPIVACAINE ON-Q PAIN PUMP (FOR ORDER SET NO CHG): INJECTION | Status: DC

## 2011-06-24 MED ORDER — PROMETHAZINE HCL 25 MG/ML IJ SOLN: 6.2500 mg | INTRAMUSCULAR | Status: DC | PRN

## 2011-06-24 MED ORDER — SODIUM CHLORIDE 0.9 % IR SOLN
Status: DC | PRN
  Administered 2011-06-24: 1000 mL

## 2011-06-24 MED ORDER — LIDOCAINE HCL (CARDIAC) 20 MG/ML IV SOLN
INTRAVENOUS | Status: DC | PRN
  Administered 2011-06-24: 100 mg via INTRAVENOUS

## 2011-06-24 MED ORDER — DIPHENHYDRAMINE HCL 12.5 MG/5ML PO ELIX: 12.5000 mg | ORAL_SOLUTION | ORAL | Status: DC | PRN

## 2011-06-24 MED ORDER — ACETAMINOPHEN 325 MG PO TABS: 650.0000 mg | ORAL_TABLET | Freq: Four times a day (QID) | ORAL | Status: DC | PRN

## 2011-06-24 MED ORDER — LEVOTHYROXINE SODIUM 100 MCG PO TABS
100.0000 ug | ORAL_TABLET | Freq: Every day | ORAL | Status: DC
  Administered 2011-06-25 – 2011-06-27 (×3): 100 ug via ORAL
  Filled 2011-06-24 (×3): qty 1

## 2011-06-24 MED ORDER — ONDANSETRON HCL 4 MG PO TABS: 4.0000 mg | ORAL_TABLET | Freq: Four times a day (QID) | ORAL | Status: DC | PRN

## 2011-06-24 MED ORDER — ONDANSETRON HCL 4 MG/2ML IJ SOLN: 4.0000 mg | Freq: Four times a day (QID) | INTRAMUSCULAR | Status: DC | PRN

## 2011-06-24 MED ORDER — EPHEDRINE SULFATE 50 MG/ML IJ SOLN
INTRAMUSCULAR | Status: DC | PRN
  Administered 2011-06-24 (×3): 5 mg via INTRAVENOUS

## 2011-06-24 MED ORDER — HYDROCODONE-ACETAMINOPHEN 5-325 MG PO TABS
1.0000 | ORAL_TABLET | ORAL | Status: DC | PRN
  Administered 2011-06-24: 1 via ORAL
  Administered 2011-06-25: 2 via ORAL
  Administered 2011-06-25 – 2011-06-27 (×5): 1 via ORAL
  Filled 2011-06-24 (×3): qty 1
  Filled 2011-06-24: qty 2
  Filled 2011-06-24 (×2): qty 1
  Filled 2011-06-24: qty 2

## 2011-06-24 MED ORDER — POLYETHYLENE GLYCOL 3350 17 G PO PACK
17.0000 g | PACK | Freq: Every day | ORAL | Status: DC | PRN
  Filled 2011-06-24: qty 1

## 2011-06-24 SURGICAL SUPPLY — 53 items
BAG ZIPLOCK 12X15 (MISCELLANEOUS) ×2 IMPLANT
BANDAGE ELASTIC 6 VELCRO ST LF (GAUZE/BANDAGES/DRESSINGS) ×2 IMPLANT
BANDAGE ESMARK 6X9 LF (GAUZE/BANDAGES/DRESSINGS) ×1 IMPLANT
BLADE SAG 18X100X1.27 (BLADE) ×2 IMPLANT
BLADE SAW SGTL 11.0X1.19X90.0M (BLADE) ×2 IMPLANT
BNDG ESMARK 6X9 LF (GAUZE/BANDAGES/DRESSINGS) ×2
BOWL SMART MIX CTS (DISPOSABLE) ×2 IMPLANT
CATH KIT ON-Q SILVERSOAK 5IN (CATHETERS) ×2 IMPLANT
CEMENT HV SMART SET (Cement) ×4 IMPLANT
CLOSURE STERI STRIP 1/2 X4 (GAUZE/BANDAGES/DRESSINGS) ×2 IMPLANT
CLOTH BEACON ORANGE TIMEOUT ST (SAFETY) ×2 IMPLANT
CUFF TOURN SGL QUICK 34 (TOURNIQUET CUFF) ×1
CUFF TRNQT CYL 34X4X40X1 (TOURNIQUET CUFF) ×1 IMPLANT
DRAPE EXTREMITY T 121X128X90 (DRAPE) ×2 IMPLANT
DRAPE POUCH INSTRU U-SHP 10X18 (DRAPES) ×2 IMPLANT
DRAPE U-SHAPE 47X51 STRL (DRAPES) ×2 IMPLANT
DRSG ADAPTIC 3X8 NADH LF (GAUZE/BANDAGES/DRESSINGS) ×2 IMPLANT
DURAPREP 26ML APPLICATOR (WOUND CARE) ×2 IMPLANT
ELECT REM PT RETURN 9FT ADLT (ELECTROSURGICAL) ×2
ELECTRODE REM PT RTRN 9FT ADLT (ELECTROSURGICAL) ×1 IMPLANT
EVACUATOR 1/8 PVC DRAIN (DRAIN) ×2 IMPLANT
FACESHIELD LNG OPTICON STERILE (SAFETY) ×10 IMPLANT
GAUZE SPONGE 4X4 12PLY STRL LF (GAUZE/BANDAGES/DRESSINGS) ×2 IMPLANT
GLOVE BIO SURGEON STRL SZ7.5 (GLOVE) ×2 IMPLANT
GLOVE BIO SURGEON STRL SZ8 (GLOVE) ×2 IMPLANT
GLOVE BIOGEL PI IND STRL 8 (GLOVE) ×2 IMPLANT
GLOVE BIOGEL PI INDICATOR 8 (GLOVE) ×2
GOWN STRL NON-REIN LRG LVL3 (GOWN DISPOSABLE) ×2 IMPLANT
GOWN STRL REIN XL XLG (GOWN DISPOSABLE) ×2 IMPLANT
HANDPIECE INTERPULSE COAX TIP (DISPOSABLE) ×1
IMMOBILIZER KNEE 20 (SOFTGOODS) ×2
IMMOBILIZER KNEE 20 THIGH 36 (SOFTGOODS) ×1 IMPLANT
IMMOBILIZER KNEE 22 UNIV (SOFTGOODS) ×2 IMPLANT
KIT BASIN OR (CUSTOM PROCEDURE TRAY) ×2 IMPLANT
MANIFOLD NEPTUNE II (INSTRUMENTS) ×2 IMPLANT
NS IRRIG 1000ML POUR BTL (IV SOLUTION) ×2 IMPLANT
PACK TOTAL JOINT (CUSTOM PROCEDURE TRAY) ×2 IMPLANT
PAD ABD 7.5X8 STRL (GAUZE/BANDAGES/DRESSINGS) ×2 IMPLANT
PADDING CAST ABS 6INX4YD NS (CAST SUPPLIES) ×1
PADDING CAST ABS COTTON 6X4 NS (CAST SUPPLIES) ×1 IMPLANT
PADDING CAST COTTON 6X4 STRL (CAST SUPPLIES) ×6 IMPLANT
POSITIONER SURGICAL ARM (MISCELLANEOUS) ×2 IMPLANT
SET HNDPC FAN SPRY TIP SCT (DISPOSABLE) ×1 IMPLANT
SPONGE GAUZE 4X4 12PLY (GAUZE/BANDAGES/DRESSINGS) ×2 IMPLANT
STRIP CLOSURE SKIN 1/2X4 (GAUZE/BANDAGES/DRESSINGS) ×4 IMPLANT
SUCTION FRAZIER 12FR DISP (SUCTIONS) ×2 IMPLANT
SUT MNCRL AB 4-0 PS2 18 (SUTURE) ×2 IMPLANT
SUT PDS AB 1 CT1 27 (SUTURE) ×6 IMPLANT
SUT VIC AB 2-0 CT1 27 (SUTURE) ×3
SUT VIC AB 2-0 CT1 TAPERPNT 27 (SUTURE) ×3 IMPLANT
TOWEL OR 17X26 10 PK STRL BLUE (TOWEL DISPOSABLE) ×4 IMPLANT
TRAY FOLEY CATH 14FRSI W/METER (CATHETERS) ×2 IMPLANT
WATER STERILE IRR 1500ML POUR (IV SOLUTION) ×2 IMPLANT

## 2011-06-24 NOTE — Preoperative (Signed)
Beta Blockers   Reason not to administer Beta Blockers:Not Applicable 

## 2011-06-24 NOTE — Progress Notes (Signed)
Utilization review completed.  

## 2011-06-24 NOTE — Interval H&P Note (Signed)
History and Physical Interval Note:  06/24/2011 10:03 AM  Evan Miller  has presented today for surgery, with the diagnosis of Osteoarthritis of the Right Knee  The various methods of treatment have been discussed with the patient and family. After consideration of risks, benefits and other options for treatment, the patient has consented to  Procedure(s): TOTAL KNEE ARTHROPLASTY as a surgical intervention .  The patients' history has been reviewed, patient examined, no change in status, stable for surgery.  I have reviewed the patients' chart and labs.  Questions were answered to the patient's satisfaction.     Loanne Drilling

## 2011-06-24 NOTE — Anesthesia Postprocedure Evaluation (Signed)
  Anesthesia Post-op Note  Patient: Evan Miller  Procedure(s) Performed:  TOTAL KNEE ARTHROPLASTY  Patient Location: PACU  Anesthesia Type: Spinal  Level of Consciousness: oriented and sedated  Airway and Oxygen Therapy: Patient Spontanous Breathing and Patient connected to nasal cannula oxygen  Post-op Pain: none  Post-op Assessment: Post-op Vital signs reviewed, Patient's Cardiovascular Status Stable, Respiratory Function Stable and Patent Airway  Post-op Vital Signs: stable  Complications: No apparent anesthesia complications

## 2011-06-24 NOTE — Anesthesia Preprocedure Evaluation (Signed)
Anesthesia Evaluation  Patient identified by MRN, date of birth, ID band Patient awake  General Assessment Comment:Advanced yrs  Reviewed: Allergy & Precautions, H&P , NPO status , Patient's Chart, lab work & pertinent test results, reviewed documented beta blocker date and time   History of Anesthesia Complications (+) PONV  Airway Mallampati: II TM Distance: >3 FB Neck ROM: Full    Dental  (+) Upper Dentures and Lower Dentures   Pulmonary neg pulmonary ROS, former smoker clear to auscultation        Cardiovascular neg cardio ROS Regular Normal Denies cardiac symptoms   Neuro/Psych Negative Neurological ROS  Negative Psych ROS   GI/Hepatic negative GI ROS, (+) Hepatitis -, AAs a child- 10 yrs old   Endo/Other  Hypothyroidism Thyroid replacement  Renal/GU negative Renal ROS   Hx prostate cancer    Musculoskeletal negative musculoskeletal ROS (+)   Abdominal   Peds negative pediatric ROS (+)  Hematology negative hematology ROS (+)   Anesthesia Other Findings   Reproductive/Obstetrics negative OB ROS                           Anesthesia Physical Anesthesia Plan  ASA: II  Anesthesia Plan: Spinal   Post-op Pain Management:    Induction:   Airway Management Planned: Mask  Additional Equipment:   Intra-op Plan:   Post-operative Plan:   Informed Consent: I have reviewed the patients History and Physical, chart, labs and discussed the procedure including the risks, benefits and alternatives for the proposed anesthesia with the patient or authorized representative who has indicated his/her understanding and acceptance.     Plan Discussed with: CRNA and Surgeon  Anesthesia Plan Comments:         Anesthesia Quick Evaluation

## 2011-06-24 NOTE — Plan of Care (Signed)
Problem: Consults Goal: Diagnosis- Total Joint Replacement Right total knee     

## 2011-06-24 NOTE — Transfer of Care (Signed)
Immediate Anesthesia Transfer of Care Note  Patient: Evan Miller  Procedure(s) Performed:  TOTAL KNEE ARTHROPLASTY  Patient Location: PACU  Anesthesia Type: Regional and Spinal  Level of Consciousness: awake, alert  and oriented  Airway & Oxygen Therapy: Patient Spontanous Breathing and Patient connected to nasal cannula oxygen  Post-op Assessment: Report given to PACU RN and Post -op Vital signs reviewed and stable  Post vital signs: Reviewed and stable Filed Vitals:   06/24/11 0737  BP: 146/77  Pulse: 67  Temp: 36.4 C  Resp: 20    Complications: No apparent anesthesia complications

## 2011-06-24 NOTE — Op Note (Signed)
Pre-operative diagnosis- Osteoarthritis  Right knee(s)  Post-operative diagnosis- Osteoarthritis Right knee(s)  Procedure-  Right  Total Knee Arthroplasty  Surgeon- Gus Rankin. Alexio Sroka, MD  Assistant- Avel Peace, PA-C   Anesthesia-  Spinal EBL-* No blood loss amount entered *  Drains Hemovac  Tourniquet time- * Missing tourniquet times found for documented tourniquets in log:  5332 *   Complications- None  Condition-PACU - hemodynamically stable.   Brief Clinical Note  Evan Miller is a 78 y.o. year old male with end stage OA of his right knee with progressively worsening pain and dysfunction. He has constant pain, with activity and at rest and significant functional deficits with difficulties even with ADLs. He has had extensive non-op management including analgesics, injections of cortisone and viscosupplements, and home exercise program, but remains in significant pain with significant dysfunction. He has bone on bone arthritis with bony erosions of his medial tibia and a significant varus deformity.  He presents now for right Total Knee Arthroplasty.    Procedure in detail---   The patient is brought into the operating room and positioned supine on the operating table. After successful administration of  Spinal,   a tourniquet is placed high on the  Right thigh(s) and the lower extremity is prepped and draped in the usual sterile fashion. Time out is performed by the operating team and then the  Right lower extremity is wrapped in Esmarch, knee flexed and the tourniquet inflated to 300 mmHg.       A midline incision is made with a ten blade through the subcutaneous tissue to the level of the extensor mechanism. A fresh blade is used to make a medial parapatellar arthrotomy. Soft tissue over the proximal medial tibia is subperiosteally elevated to the joint line with a knife and into the semimembranosus bursa with a Cobb elevator. Soft tissue over the proximal lateral tibia is elevated  with attention being paid to avoiding the patellar tendon on the tibial tubercle. The patella is everted, knee flexed 90 degrees and the ACL and PCL are removed. Findings are bone on bone medial and patellofemoral with large medial osteophytes.        The drill is used to create a starting hole in the distal femur and the canal is thoroughly irrigated with sterile saline to remove the fatty contents. The 5 degree Right  valgus alignment guide is placed into the femoral canal and the distal femoral cutting block is pinned to remove 11 mm off the distal femur. Resection is made with an oscillating saw.      The tibia is subluxed forward and the menisci are removed. The extramedullary alignment guide is placed referencing proximally at the medial aspect of the tibial tubercle and distally along the second metatarsal axis and tibial crest. The block is pinned to remove 2mm off the more deficient medial  side. Resection is made with an oscillating saw. Size 4is the most appropriate size for the tibia and the proximal tibia is prepared with the modular drill and keel punch for that size.      The femoral sizing guide is placed and size 5 is most appropriate. Rotation is marked off the epicondylar axis and confirmed by creating a rectangular flexion gap at 90 degrees. The size 5 cutting block is pinned in this rotation and the anterior, posterior and chamfer cuts are made with the oscillating saw. The intercondylar block is then placed and that cut is made.      Trial size  4 tibial component, trial size 5 posterior stabilized femur and a 12.5  mm posterior stabilized rotating platform insert trial is placed. Full extension is achieved with excellent varus/valgus and anterior/posterior balance throughout full range of motion. The patella is everted and thickness measured to be 26  mm. Free hand resection is taken to 15 mm, a 41 template is placed, lug holes are drilled, trial patella is placed, and it tracks normally.  Osteophytes are removed off the posterior femur with the trial in place. All trials are removed and the cut bone surfaces prepared with pulsatile lavage. Cement is mixed and once ready for implantation, the size 4 tibial implant, size  5 posterior stabilized femoral component, and the size 41 patella are cemented in place and the patella is held with the clamp. The trial insert is placed and the knee held in full extension. All extruded cement is removed and once the cement is hard the permanent 12.5 mm posterior stabilized rotating platform insert is placed into the tibial tray.      The wound is copiously irrigated with saline solution and the extensor mechanism closed over a hemovac drain with #1 PDS suture. The tourniquet is released for a total tourniquet time of 35  minutes. Flexion against gravity is 140  degrees and the patella tracks normally. Subcutaneous tissue is closed with 2.0 vicryl and subcuticular with running 4.0 Monocryl. The catheter for the Marcaine pain pump is placed and the pump is initiated. The incision is cleaned and dried and steri-strips and a bulky sterile dressing are applied. The limb is placed into a knee immobilizer and the patient is awakened and transported to recovery in stable condition.      Please note that a surgical assistant was a medical necessity for this procedure in order to perform it in a safe and expeditious manner. Surgical assistant was necessary to retract the ligaments and vital neurovascular structures to prevent injury to them and also necessary for proper positioning of the limb to allow for anatomic placement of the prosthesis.   Gus Rankin Gardner Servantes, MD    06/24/2011, 11:21 AM

## 2011-06-24 NOTE — H&P (View-Only) (Signed)
Evan Miller  DOB: 05/31/1926 Single / Race: White / Male  Date of Admission:  06/24/2011  Chief Complaint:  Right Knee Pain  History of Present Illness The patient is a 78 year old male who comes in today for a preoperative History and Physical. The patient is scheduled for a right total knee arthroplasty to be performed by Dr. Frank V. Aluisio, MD at [facility] on 06/24/2011. Evan Miller feels like the left knee is doing great. He states it is the best it has felt in quite a while. The big problem he is having with ambulating is his right knee. He has known advanced arthritis in the right knee also. He feels like that is holding him back a little. They have been treated conservatively in the past for the above stated problem and despite conservative measures, they continue to have progressive pain and severe functional limitations and dysfunction. It is felt that they would benefit from undergoing total joint replacement. He has done well with his left knee surgery. Risks and benefits of the procedure have been discussed with the patient and they elect to proceed with surgery. There are no active contraindications to surgery such as ongoing infection or rapidly progressive neurological disease.  Allergies Penicillins. Hives. Sulfanomides. Rash.  Problem List/Past Medical History of Prostate Cancer Hypothyroidism Tinnitus History of Shingles Glaucoma Osteoarthrosis NOS, lower leg (715.96) Hypercholesterolemia Impaired Hearing Tinnitus Shingles. Past History Cataract Enlarged prostate Skin Cancer  Past Surgical History Dental Extraction Surgery Skin Cancer Surgery (Basal Cell and Melanoma) Pilonidal Cyst Surgery Total Knee Replacement - Left  Family History Heart Disease. father Father. Deceased, Heart disease. Age 76 Mother. Deceased. Age 96 Cancer. Prostate Cancer  Social History Alcohol use. current drinker; drinks beer and hard  liquor; only occasionally per week Children. 3 Current work status. Retired. Machine work. retired Drug/Alcohol Rehab (Currently). no Exercise. Exercises daily; does running / walking Illicit drug use. no Living situation. Lives with spouse. live with spouse Marital status. Married. married Number of flights of stairs before winded. 2-3 Pain Contract. no Previously in rehab. no Tobacco use. Former smoker. Quit 45 years ago former smoker; smoke(d) 1/2 pack(s) per day No alcohol use  Review of Systems General:Not Present- Chills, Fever, Night Sweats, Appetite Loss, Fatigue, Feeling sick, Weight Gain and Weight Loss. Skin:Not Present- Itching, Rash, Skin Color Changes, Ulcer, Psoriasis and Change in Hair or Nails. HEENT:Present- Tinnitus, Hearing Loss and Decreased Hearing. Not Present- Sensitivity to light, Nose Bleed, Visual Loss and Ringing in the Ears. Neck:Not Present- Swollen Glands and Neck Mass. Respiratory:Not Present- Shortness of breath, Snoring, Chronic Cough and Bloody sputum. Cardiovascular:Not Present- Shortness of Breath, Chest Pain, Swelling of Extremities, Leg Cramps and Palpitations. Gastrointestinal:Present- Heartburn. Not Present- Bloody Stool, Abdominal Pain, Vomiting, Nausea and Incontinence of Stool. Male Genitourinary:Present- Urinary Retention. Not Present- Blood in Urine, Frequency, Incontinence and Nocturia. Musculoskeletal:Present- Joint Stiffness, Joint Swelling, Back Pain and Morning Stiffness. Not Present- Muscle Weakness, Muscle Pain and Joint Pain. Neurological:Not Present- Tingling, Numbness, Burning, Tremor, Headaches and Dizziness. Psychiatric:Not Present- Anxiety, Depression and Memory Loss. Endocrine:Not Present- Cold Intolerance, Heat Intolerance, Excessive hunger and Excessive Thirst. Hematology:Not Present- Abnormal Bleeding, Abnormal bruising, Anemia and Blood Clots.  Vitals Weight: 183 lb Height: 64 in Body Surface  Area: 1.94 m Body Mass Index: 31.41 kg/m Pulse: 76 (Regular) Resp.: 14 (Unlabored) BP: 120/68 (Sitting, Left Arm, Standard)  Physical Exam The physical exam findings are as follows:  General Mental Status - Alert, cooperative and good historian. General   Appearance- pleasant. Not in acute distress. Orientation- Oriented X3. Build & Nutrition- Well nourished and Well developed.  Head and Neck Head- normocephalic, atraumatic . Neck Global Assessment- supple. no bruit auscultated on the right and no bruit auscultated on the left.  Eye Pupil- Bilateral- Regular and Round. Motion- Bilateral- EOMI. Wears glasses  Chest and Lung Exam Auscultation: Breath sounds:- clear at anterior chest wall and - clear at posterior chest wall. Adventitious sounds:- No Adventitious sounds.  Cardiovascular Auscultation:Rhythm- Regular rate and rhythm. Heart Sounds- S1 WNL and S2 WNL. Murmurs & Other Heart Sounds: Murmur 1:Location- Sternal Border - Left. Timing- Mid-systolic. Grade- II/VI. Character- Crescendo/Decrescendo.  Abdomen Palpation/Percussion:Tenderness- Abdomen is non-tender to palpation. Rigidity (guarding)- Abdomen is soft. Auscultation:Auscultation of the abdomen reveals - Bowel sounds normal.  Male Genitourinary Not done, not pertinent to present illness  Musculoskeletal Evaluation of his left knee shows a well healed incision with no erythema or exudate. He has range of motion of that left knee from about 10 to 106 actively, 7 to 110 passively. His right knee shows no effusion. He has a significant varus deformity. He is tender over the medial greater than lateral knee. There is no instability about the right knee.  Assessment & Plan Osteoarthritis Right Knee  Patient is for a Right Total Knee Replacement per Dr. Frank Aluisio.  He went to [facility] following his previous left total knee and will either look into that  facility again versus going home if doing very well following his surgery.  Drew Perkins, PA-C    

## 2011-06-24 NOTE — Anesthesia Procedure Notes (Signed)
Spinal  Patient location during procedure: OR Start time: 06/24/2011 10:10 AM End time: 06/24/2011 10:19 AM Staffing Anesthesiologist: Lestine Box B Performed by: anesthesiologist  Preanesthetic Checklist Completed: patient identified, site marked, surgical consent, pre-op evaluation, timeout performed, IV checked, risks and benefits discussed and monitors and equipment checked Spinal Block Patient position: sitting Prep: Betadine Patient monitoring: heart rate, cardiac monitor, continuous pulse ox and blood pressure Location: L3-4 Injection technique: single-shot Needle Needle type: Quincke  Needle gauge: 22 G Needle length: 10 cm Needle insertion depth: 3 cm Assessment Sensory level: T6 Additional Notes [id number], 2013-11

## 2011-06-25 LAB — CBC
MCH: 28.6 pg (ref 26.0–34.0)
MCHC: 33.8 g/dL (ref 30.0–36.0)
Platelets: 183 10*3/uL (ref 150–400)
RBC: 3.67 MIL/uL — ABNORMAL LOW (ref 4.22–5.81)

## 2011-06-25 LAB — BASIC METABOLIC PANEL
Calcium: 8.3 mg/dL — ABNORMAL LOW (ref 8.4–10.5)
GFR calc non Af Amer: 81 mL/min — ABNORMAL LOW (ref >90)
Potassium: 3.8 mEq/L (ref 3.5–5.1)
Sodium: 135 mEq/L (ref 135–145)

## 2011-06-25 NOTE — Progress Notes (Signed)
FL2 in shadow chart for MD signature. Pt states he isn't sure he will need SNF upon D/C but did give CSW permission to contact [facility] incase placement was needed. CSW will meet with pt/spouse again in am to assist with d/c planning.

## 2011-06-25 NOTE — Progress Notes (Signed)
Subjective: 1 Day Post-Op Procedure(s) (LRB): TOTAL KNEE ARTHROPLASTY (Right) Patient reports pain as mild.   Patient seen in rounds with Dr. Lequita Halt. Patient has complaints of some soreness but doing very well on day one. We will start therapy today. Plan is to go rehab after hospital stay.  Objective: Vital signs in last 24 hours: Temp:  [96.8 F (36 C)-98.4 F (36.9 C)] 98.3 F (36.8 C) (01/15 0445) Pulse Rate:  [60-70] 67  (01/15 0445) Resp:  [14-16] 16  (01/15 0445) BP: (94-125)/(63-75) 94/63 mmHg (01/15 0445) SpO2:  [98 %-100 %] 98 % (01/15 0445) Weight:  [81.194 kg (179 lb)] 81.194 kg (179 lb) (01/14 1500)  Intake/Output from previous day:  Intake/Output Summary (Last 24 hours) at 06/25/11 0854 Last data filed at 06/25/11 0445  Gross per 24 hour  Intake   3800 ml  Output   3175 ml  Net    625 ml    Intake/Output this shift:    Labs:  Basename 06/25/11 0430  HGB 10.5*    Basename 06/25/11 0430  WBC 6.3  RBC 3.67*  HCT 31.1*  PLT 183    Basename 06/25/11 0430  NA 135  K 3.8  CL 102  CO2 27  BUN 7  CREATININE 0.79  GLUCOSE 141*  CALCIUM 8.3*   No results found for this basename: LABPT:2,INR:2 in the last 72 hours  Exam - Neurovascular intact Sensation intact distally Dressing - clean, dry Motor function intact - moving foot and toes well on exam.  Hemovac pulled without difficulty.  Past Medical History  Diagnosis Date  . PONV (postoperative nausea and vomiting)   . Hypothyroidism   . Hepatitis     age 78 years  . GERD (gastroesophageal reflux disease)   . Arthritis     back,shoulders and hips  . Cancer     basal cell/ Melanoma 1997 left  shoulder/  PROSTATE    Assessment/Plan: 1 Day Post-Op Procedure(s) (LRB): TOTAL KNEE ARTHROPLASTY (Right)  Advance diet Up with therapy Discharge to SNF when bed available.  DVT Prophylaxis - Xarelto  Protocol Weight-Bearing as tolerated to right leg Keep foley until tomorrow. No  vaccines. D/C PCA, Change to IV push D/C O2 and Pulse OX and try on [address]  Patrica Duel 06/25/2011, 8:54 AM

## 2011-06-25 NOTE — Progress Notes (Signed)
Physical Therapy Treatment Patient Details Name: Evan Miller MRN: [id number] DOB: [birth year] Today's Date: 06/25/2011 1402-1427eg PT Assessment/Plan  PT - Assessment/Plan PT Frequency: 7X/week Recommendations for Other Services: OT consult Follow Up Recommendations: Home health PT;Skilled nursing facility Equipment Recommended: None recommended by PT PT Goals  Acute Rehab PT Goals PT Goal Formulation: With patient/family Time For Goal Achievement: 7 days Pt will go Supine/Side to Sit: with supervision PT Goal: Supine/Side to Sit - Progress: Not met Pt will go Sit to Supine/Side: with supervision PT Goal: Sit to Supine/Side - Progress: Not met Pt will go Sit to Stand: with supervision PT Goal: Sit to Stand - Progress: Not met Pt will go Stand to Sit: with supervision PT Goal: Stand to Sit - Progress: Not met Pt will Transfer Bed to Chair/Chair to Bed: with supervision PT Transfer Goal: Bed to Chair/Chair to Bed - Progress: Not met Pt will Ambulate: 51 - 150 feet;with min assist;with rolling walker PT Goal: Ambulate - Progress: Not met Pt will Go Up / Down Stairs: 1-2 stairs;with min assist;with rolling walker(if dc'd to home) PT Goal: Up/Down Stairs - Progress: Not met Pt will Perform Home Exercise Program: with supervision, verbal cues required/provided PT Goal: Perform Home Exercise Program - Progress: Not met  PT Treatment Precautions/Restrictions  Precautions Precautions: Knee Required Braces or Orthoses: Yes Restrictions Weight Bearing Restrictions: No Mobility (including Balance) Bed Mobility Bed Mobility: Yes Sit to supine: min assist for placing RLE onto bed Transfers Transfers: Yes Sit to Stand: 1: +2 Total assist;From elevated surface;From chair, pt=60  Stand Details (indicate cue type and reason): vc to slow down, pt is impulsive, vc to  push from recliner Stand to Sit: 3: Mod assist;With armrests;To 1;With upper extremity assist Stand to Sit Details:  tc/vc to reach back to bed, and place RLE forward Ambulation/Gait Ambulation/Gait: Yes Ambulation/Gait Assistance: 1: +2 Total assist Ambulation/Gait Assistance Details (indicate cue type and reason): vc for posture, tc to keep from placing RW too far forward, vc for sequence Ambulation Distance (Feet): 15 ft Assistive device: Rolling walker Gait Pattern: Step-to pattern;Decreased weight shift to right  Posture/Postural Control Posture/Postural Control: Postural limitations Postural Limitations: pt is forward flexed Exercise  Total Joint Exercises Ankle Circles/Pumps: AROM;Both;10 reps;Supine Quad Sets: AAROM;Right;10 reps Straight Leg Raises: AAROM;Right;10 reps;Supine End of Session PT - End of Session Equipment Utilized During Treatment: Right knee immobilizer Activity Tolerance: Patient limited by pain Patient left: in chair;with family/visitor present;with call bell in reach Nurse Communication: Mobility status for transfers General Behavior During Session: [facility] for tasks performed Cognition: [facility] for tasks performed  [name] 06/25/2011, 3:49 PM

## 2011-06-25 NOTE — Progress Notes (Signed)
Physical Therapy Evaluation Patient Details Name: Evan Miller MRN: [id number] DOB: 1926/02/11 Today's Date: 06/25/2011 1022-1100 ev2 Problem List:  Patient Active Problem List  Diagnoses  . OA (osteoarthritis) of knee    Past Medical History:  Past Medical History  Diagnosis Date  . PONV (postoperative nausea and vomiting)   . Hypothyroidism   . Hepatitis     age 78 years  . GERD (gastroesophageal reflux disease)   . Arthritis     back,shoulders and hips  . Cancer     basal cell/ Melanoma 1997 left  shoulder/  PROSTATE   Past Surgical History:  Past Surgical History  Procedure Date  . Joint replacement     left knee 9/12  . Hernia repair   . Hydrocele excision     bilateral  . Colonoscopy     PT Assessment/Plan/Recommendation PT Assessment Clinical Impression Statement: pt is S/p RTKA after LTKA 4 months ago.  pt would like to DC to home, will see how he progresses.  pt can benefit from PT to improve in ROM, strength, mobility to DC to home vs. SNF PT Recommendation/Assessment: Patient will need skilled PT in the acute care venue PT Problem List: Decreased strength;Decreased range of motion;Decreased activity tolerance;Decreased mobility;Decreased knowledge of use of DME;Decreased knowledge of precautions;Pain Barriers to Discharge: Decreased caregiver support Barriers to Discharge Comments: wife is only caregiver PT Therapy Diagnosis : Difficulty walking;Acute pain PT Plan PT Frequency: 7X/week PT Treatment/Interventions: DME instruction;Gait training;Stair training;Functional mobility training;Therapeutic activities;Therapeutic exercise;Patient/family education PT Recommendation Recommendations for Other Services: OT consult Follow Up Recommendations: Home health PT;Skilled nursing facility most likely will be needed Equipment Recommended: None recommended by PT PT Goals  Acute Rehab PT Goals PT Goal Formulation: With patient/family Time For Goal Achievement:  7 days Pt will go Supine/Side to Sit: with supervision PT Goal: Supine/Side to Sit - Progress: Not met Pt will go Sit to Supine/Side: with supervision PT Goal: Sit to Supine/Side - Progress: Not met Pt will go Sit to Stand: with supervision PT Goal: Sit to Stand - Progress: Not met Pt will go Stand to Sit: with supervision PT Goal: Stand to Sit - Progress: Not met Pt will Transfer Bed to Chair/Chair to Bed: with supervision PT Transfer Goal: Bed to Chair/Chair to Bed - Progress: Not met Pt will Ambulate: 51 - 150 feet;with min assist;with rolling walker PT Goal: Ambulate - Progress: Not met Pt will Go Up / Down Stairs: 1-2 stairs;with min assist;with rolling walker PT Goal: Up/Down Stairs - Progress: Not met Pt will Perform Home Exercise Program: with supervision, verbal cues required/provided PT Goal: Perform Home Exercise Program - Progress: Not met  PT Evaluation Precautions/Restrictions  Precautions Precautions: Knee Required Braces or Orthoses: Yes Restrictions Weight Bearing Restrictions: No Prior Functioning  Home Living Lives With: Spouse Receives Help From: Family Type of Home: House Home Layout: One level Home Access: Stairs to enter Entrance Stairs-Rails: None Entrance Stairs-Number of Steps: 1 Home Adaptive Equipment: Walker - rolling;Bedside commode/3-in-1 Additional Comments: wife is only caregiver so rehab may be neccessary Prior Function Level of Independence: Independent with basic ADLs;Independent with homemaking with ambulation;Independent with gait Able to Take Stairs?: Yes Driving: Yes Vocation: Retired Producer, television/film/video: Awake/alert Overall Cognitive Status: Appears within functional limits for tasks assessed Orientation Level: Oriented X4 Sensation/Coordination Sensation Light Touch: Appears Intact Coordination Gross Motor Movements are Fluid and Coordinated: Yes Fine Motor Movements are Fluid and Coordinated: No (pt is  moderately shakey) Extremity Assessment RUE Assessment RUE  Assessment: Within Functional Limits LUE Assessment LUE Assessment: Within Functional Limits RLE Assessment RLE Assessment: Exceptions to [facility] RLE AROM (degrees) RLE Overall AROM Comments: knee flexion to 30 degrees RLE Strength RLE Overall Strength Comments: requires assist for SLR LLE Assessment LLE Assessment: Within Functional Limits Mobility (including Balance) Bed Mobility Bed Mobility: Yes Supine to Sit: With rails;HOB elevated (Comment degrees);2: Max assist Supine to Sit Details (indicate cue type and reason): pt required assist to move on bed and pull up to sitting, HOB at 50 Transfers Transfers: Yes Sit to Stand: 1: +2 Total assist;From elevated surface;From bed Sit to Stand Details (indicate cue type and reason): vc to slow down, pt is impulsive, vc to  push from bed. Stand to Sit: 3: Mod assist;With armrests;To chair/3-in-1;With upper extremity assist Stand to Sit Details: tc/vc to reach back to armrests, and place RLE forward Ambulation/Gait Ambulation/Gait: Yes Ambulation/Gait Assistance: 1: +2 Total assist Ambulation/Gait Assistance Details (indicate cue type and reason): vc for posture, tc to keep from placing RW too far forward, vc for sequence Ambulation Distance (Feet): 10 Feet Assistive device: Rolling walker Gait Pattern: Step-to pattern;Decreased weight shift to right  Posture/Postural Control Posture/Postural Control: Postural limitations Postural Limitations: pt is forward flexed Exercise  Total Joint Exercises Ankle Circles/Pumps: AROM;Both;10 reps;Supine Quad Sets: AAROM;Right;10 reps Straight Leg Raises: AAROM;Right;10 reps;Supine End of Session PT - End of Session Equipment Utilized During Treatment: Right knee immobilizer Activity Tolerance: Patient limited by pain Patient left: in chair;with family/visitor present;with call bell in reach Nurse Communication: Mobility status for  transfers General Behavior During Session: [facility] for tasks performed Cognition: [facility] for tasks performed  [name] 06/25/2011, 3:46 PM

## 2011-06-26 ENCOUNTER — Encounter (HOSPITAL_COMMUNITY): Payer: Self-pay | Admitting: Orthopedic Surgery

## 2011-06-26 LAB — BASIC METABOLIC PANEL
CO2: 27 mEq/L (ref 19–32)
Chloride: 103 mEq/L (ref 96–112)
Potassium: 3.8 mEq/L (ref 3.5–5.1)
Sodium: 137 mEq/L (ref 135–145)

## 2011-06-26 LAB — CBC
Platelets: 216 10*3/uL (ref 150–400)
RBC: 3.62 MIL/uL — ABNORMAL LOW (ref 4.22–5.81)
WBC: 8.4 10*3/uL (ref 4.0–10.5)

## 2011-06-26 NOTE — Progress Notes (Signed)
[facility] has ST SNF bed Thurs. If pt agreeable with placement. CSW will met with pt in am to assist with d/c planning.

## 2011-06-26 NOTE — Progress Notes (Signed)
Subjective: 2 Days Post-Op Procedure(s) (LRB): TOTAL KNEE ARTHROPLASTY (Right) Patient reports pain as mild.   Patient seen in rounds with Dr. Lequita Halt. Patient is doing well.  Family in room.  Objective: Vital signs in last 24 hours: Temp:  [98.2 F (36.8 C)-98.7 F (37.1 C)] 98.7 F (37.1 C) (01/16 0545) Pulse Rate:  [80-85] 80  (01/16 0545) Resp:  [16-20] 20  (01/16 0545) BP: (94-120)/(54-67) 94/58 mmHg (01/16 0545) SpO2:  [95 %-99 %] 95 % (01/16 0545)  Intake/Output from previous day:  Intake/Output Summary (Last 24 hours) at 06/26/11 1048 Last data filed at 06/26/11 0550  Gross per 24 hour  Intake    360 ml  Output   4450 ml  Net  -4090 ml    Intake/Output this shift:    Labs:  Basename 06/26/11 0430 06/25/11 0430  HGB 10.2* 10.5*    Basename 06/26/11 0430 06/25/11 0430  WBC 8.4 6.3  RBC 3.62* 3.67*  HCT 30.6* 31.1*  PLT 216 183    Basename 06/26/11 0430 06/25/11 0430  NA 137 135  K 3.8 3.8  CL 103 102  CO2 27 27  BUN 6 7  CREATININE 0.80 0.79  GLUCOSE 100* 141*  CALCIUM 8.9 8.3*   No results found for this basename: LABPT:2,INR:2 in the last 72 hours  Exam - Neurovascular intact Sensation intact distally Dressing/Incision - clean, dry, no drainage Motor function intact - moving foot and toes well on exam.   Past Medical History  Diagnosis Date  . PONV (postoperative nausea and vomiting)   . Hypothyroidism   . Hepatitis     age 6 years  . GERD (gastroesophageal reflux disease)   . Arthritis     back,shoulders and hips  . Cancer     basal cell/ Melanoma 1997 left  shoulder/  PROSTATE    Assessment/Plan: 2 Days Post-Op Procedure(s) (LRB): TOTAL KNEE ARTHROPLASTY (Right)  Up with therapy Plan for discharge tomorrow Discharge to SNF [facility]  DVT Prophylaxis - Xarelto Protocol Weight-Bearing as tolerated to right leg  Evan Miller 06/26/2011, 10:48 AM

## 2011-06-26 NOTE — Progress Notes (Signed)
Physical Therapy Treatment Patient Details Name: Evan Miller MRN: [id number] DOB: 1926-04-03 Today's Date: 06/26/2011 1030-1100 g [place] PT Assessment/Plan  PT - Assessment/Plan Comments on Treatment Session: pt improving but not to a level to DC with care by wife. gait is unsteady. rec. SNF PT Plan: Discharge plan remains appropriate;Frequency remains appropriate PT Frequency: 7X/week Recommendations for Other Services: OT consult Follow Up Recommendations: Skilled nursing facility Equipment Recommended: Defer to next venue PT Goals  Acute Rehab PT Goals PT Goal Formulation: With patient/family Time For Goal Achievement: 7 days Pt will go Supine/Side to Sit: with supervision PT Goal: Supine/Side to Sit - Progress: Progressing toward goal Pt will go Sit to Supine/Side: with supervision PT Goal: Sit to Supine/Side - Progress: Progressing toward goal Pt will go Sit to Stand: with supervision PT Goal: Sit to Stand - Progress: Progressing toward goal Pt will go Stand to Sit: with supervision PT Goal: Stand to Sit - Progress: Progressing toward goal  PT Treatment Precautions/Restrictions  Precautions Precautions: Knee Required Braces or Orthoses: Yes Knee Immobilizer: Discontinue once straight leg raise with < 10 degree lag Restrictions Weight Bearing Restrictions: No Mobility (including Balance) Bed Mobility Bed Mobility: Yes Supine to Sit: 4: Min assist;With rails;HOB elevated (Comment degrees) Supine to Sit Details (indicate cue type and reason): min assist and vc for  technique Transfers Transfers: Yes Sit to Stand: 3: Mod assist;With upper extremity assist;From elevated surface;From chair/3-in-1 Sit to Stand Details (indicate cue type and reason): vc to push from bed but pt did not follow, pt impulsive Stand to Sit: 3: Mod assist;To chair/3-in-1;With upper extremity assist Stand to Sit Details: vc to reach back for armrests, assist for RLE  support. Ambulation/Gait Ambulation/Gait: Yes Ambulation/Gait Assistance: 1: +2 Total assist Ambulation/Gait Assistance Details (indicate cue type and reason): vc for sequence and step length, and posture Ambulation Distance (Feet): 10 Feet (10x 2) Assistive device: Rolling walker Gait Pattern: Step-to pattern;Decreased weight shift to right Gait velocity: unsteady    Exercise    End of Session PT - End of Session Equipment Utilized During Treatment: Right knee immobilizer Activity Tolerance: Patient limited by fatigue Patient left: in chair;with family/visitor present;with call bell in reach Nurse Communication: Mobility status for transfers General Behavior During Session: [facility] for tasks performed Cognition: [facility] for tasks performed  [name] 06/26/2011, 1:15 PM

## 2011-06-26 NOTE — Progress Notes (Addendum)
Occupational Therapy Evaluation Patient Details Name: Evan Miller MRN: [id number] DOB: 1926-04-21 Today's Date: 06/26/2011 Time in: 10:23 am Time out: 10:56 am Eval II   Problem List:  Patient Active Problem List  Diagnoses  . OA (osteoarthritis) of knee    Past Medical History:  Past Medical History  Diagnosis Date  . PONV (postoperative nausea and vomiting)   . Hypothyroidism   . Hepatitis     age 78 years  . GERD (gastroesophageal reflux disease)   . Arthritis     back,shoulders and hips  . Cancer     basal cell/ Melanoma 1997 left  shoulder/  PROSTATE   Past Surgical History:  Past Surgical History  Procedure Date  . Joint replacement     left knee 9/12  . Hernia repair   . Hydrocele excision     bilateral  . Colonoscopy   . Total knee arthroplasty 06/24/2011    Procedure: TOTAL KNEE ARTHROPLASTY;  Surgeon: Loanne Drilling;  Location: [facility];  Service: Orthopedics;  Laterality: Right;    OT Assessment/Plan/Recommendation OT Assessment Clinical Impression Statement: Pt will benefit from skilled OT services to increase his independence and safety with ADL for next venue of care. OT Recommendation/Assessment: Patient will need skilled OT in the acute care venue OT Problem List: Decreased strength;Decreased knowledge of use of DME or AE;Pain OT Therapy Diagnosis : Generalized weakness;Acute pain OT Plan OT Treatment/Interventions: Self-care/ADL training;Therapeutic activities;DME and/or AE instruction;Patient/family education OT Recommendation Follow Up Recommendations: Skilled nursing facility Equipment Recommended: Defer to next venue Individuals Consulted Consulted and Agree with Results and Recommendations: Patient;Family member/caregiver Family Member Consulted: wife OT Goals Acute Rehab OT Goals OT Goal Formulation: With patient/family Time For Goal Achievement: 7 days ADL Goals Pt Will Perform Grooming: with min assist;Standing at sink ADL Goal:  Grooming - Progress: Goal set today Pt Will Perform Lower Body Bathing: with min assist;Sit to stand from chair;Sit to stand from bed ADL Goal: Lower Body Bathing - Progress: Goal set today Pt Will Perform Lower Body Dressing: with min assist;Sit to stand from chair;Sit to stand from bed ADL Goal: Lower Body Dressing - Progress: Goal set today Pt Will Transfer to Toilet: with min assist;with DME;3-in-1;Ambulation ADL Goal: Toilet Transfer - Progress: Goal set today Pt Will Perform Toileting - Clothing Manipulation: with min assist;Standing ADL Goal: Toileting - Clothing Manipulation - Progress: Goal set today Pt Will Perform Toileting - Hygiene: with min assist;Sit to stand from 3-in-1/toilet ADL Goal: Toileting - Hygiene - Progress: Goal set today  OT Evaluation Precautions/Restrictions  Precautions Precautions: Knee Required Braces or Orthoses: Yes Knee Immobilizer: Discontinue once straight leg raise with < 10 degree lag Restrictions Weight Bearing Restrictions: No Prior Functioning Home Living Lives With: Spouse Receives Help From: Family Type of Home: House Home Layout: One level Home Access: Stairs to enter Entrance Stairs-Rails: None Entrance Stairs-Number of Steps: 1 Bathroom Shower/Tub: Engineer, manufacturing systems: Handicapped height Home Adaptive Equipment: Walker - rolling;Bedside commode/3-in-1;Tub transfer bench Additional Comments: wife is only caregiver so rehab may be neccessary Prior Function Level of Independence: Independent with basic ADLs;Independent with homemaking with ambulation;Independent with gait Driving: Yes Vocation: Retired ADL ADL Eating/Feeding: Simulated;Independent Where Assessed - Eating/Feeding: Bed level Grooming: Simulated;Wash/dry hands;Set up Where Assessed - Grooming: Supported;Sitting, chair Upper Body Bathing: Performed;Chest;Right arm;Left arm;Abdomen;Set up Where Assessed - Upper Body Bathing: Supported;Sitting,  chair Lower Body Bathing: Simulated;Maximal assistance Lower Body Bathing Details (indicate cue type and reason): for balance support. pt is very unsteady  and leans backwards  Where Assessed - Lower Body Bathing: Sit to stand from chair;Other (comment) (from 3in1) Upper Body Dressing: Simulated;Minimal assistance Where Assessed - Upper Body Dressing: Sitting, chair;Supported Lower Body Dressing: +1 Total assistance;Simulated Lower Body Dressing Details (indicate cue type and reason): requires mod assist standing balance support to use hands while simulating pulling up pants. Where Assessed - Lower Body Dressing: Sit to stand from chair;Other (comment) (3in1) Toilet Transfer: Performed;+2 Total assistance;Comment for patient % Toilet Transfer Details (indicate cue type and reason): pt 60% for safety. pt moves quickly, unsafe with RW. tends to push RW too far in front of him and despite repeated verbal cues, did not take step in proper sequence (ie doesnt push RW ahead first)  Toilet Transfer Method: Ambulating Toilet Transfer Equipment: Raised toilet seat with arms (or 3-in-1 over toilet);Other (comment) (RW) Toileting - Clothing Manipulation: Moderate assistance;Simulated Where Assessed - Toileting Clothing Manipulation: Standing Toileting - Hygiene: Simulated;Moderate assistance Where Assessed - Toileting Hygiene: Standing Tub/Shower Transfer: Not assessed Tub/Shower Transfer Method: Not assessed Equipment Used: Rolling walker;Other (comment) (3in1) ADL Comments: Wife present for session. Cotx with PT. Pt agreeable to SNF at discharge as he was initially wanting to discharge home. Pt is currently +2 total assist for safety with transfer in and out of bathroom for safety as he tends to move quickly, is unsafe with RW use and needs balance support.  Vision/Perception  Vision - History Baseline Vision: Wears glasses all the time Cognition Cognition Arousal/Alertness: Awake/alert Orientation  Level: Oriented X4 Cognition - Other Comments: he is impulsive at times. Sensation/Coordination Sensation Light Touch: Appears Intact Extremity Assessment RUE Assessment RUE Assessment: Within Functional Limits LUE Assessment LUE Assessment: Within Functional Limits Mobility  Transfers Sit to Stand: 3: Mod assist;With upper extremity assist;From elevated surface;From chair/3-in-1 Stand to Sit: 3: Mod assist;To elevated surface;To chair/3-in-1;With upper extremity assist Exercises   End of Session OT - End of Session Equipment Utilized During Treatment: Other (comment) (RW and 3in1) Activity Tolerance: Patient tolerated treatment well Patient left: in chair;with call bell in reach;with family/visitor present General Behavior During Session: [facility] for tasks performed Cognition: [facility] for tasks performed (although impulsive at times.)   Lennox Laity [phone] 06/26/2011, 1:03 PM

## 2011-06-26 NOTE — Progress Notes (Signed)
Physical Therapy Treatment Patient Details Name: Evan Miller MRN: [id number] DOB: Jan 10, 1926 Today's Date: 06/26/[phone] 2g PT Assessment/Plan  PT - Assessment/Plan Comments on Treatment Session: pt improving but not to a level to DC with care by wife. gait is unsteady. rec. SNF PT Plan: Discharge plan remains appropriate;Frequency remains appropriate PT Frequency: 7X/week Recommendations for Other Services: OT consult Follow Up Recommendations: Skilled nursing facility Equipment Recommended: Defer to next venue PT Goals  Acute Rehab PT Goals PT Goal Formulation: With patient/family Time For Goal Achievement: 7 days Pt will go Supine/Side to Sit: with supervision PT Goal: Supine/Side to Sit - Progress: Progressing toward goal Pt will go Sit to Supine/Side: with supervision PT Goal: Sit to Supine/Side - Progress: Progressing toward goal Pt will go Sit to Stand: with supervision PT Goal: Sit to Stand - Progress: Progressing toward goal Pt will go Stand to Sit: with supervision PT Goal: Stand to Sit - Progress: Progressing toward goal Pt will Transfer Bed to Chair/Chair to Bed: with supervision PT Transfer Goal: Bed to Chair/Chair to Bed - Progress: Progressing toward goal Pt will Ambulate: 51 - 150 feet;with min assist;with rolling walker PT Goal: Ambulate - Progress: Progressing toward goal  PT Treatment Precautions/Restrictions  Precautions Precautions: Knee Required Braces or Orthoses: Yes Knee Immobilizer: Discontinue once straight leg raise with < 10 degree lag Restrictions Weight Bearing Restrictions: No Mobility (including Balance) Bed Mobility Bed Mobility: Yes  Sit to Supine: 4: Min assist Transfers Transfers: Yes Sit to Stand: 3: Mod assist;With upper extremity assist;From elevated surface;From chair/3-in-1 Sit to Stand Details (indicate cue type and reason): vc to push from bed but pt did not follow, pt impulsive Stand to Sit: 3: Mod assist;To bed;With  upper extremity assist Stand to Sit Details: vc to reach back to bed Ambulation/Gait Ambulation/Gait: Yes Ambulation/Gait Assistance: 1: +2 Total assist Ambulation/Gait Assistance Details (indicate cue type and reason): vc for step length and position of RW, tends to place too far ahead Ambulation Distance (Feet): 50 Feet Assistive device: Rolling walker Gait Pattern: Step-to pattern;Decreased weight shift to right;Decreased step length - right Gait velocity: unsteady    Exercise    End of Session PT - End of Session Equipment Utilized During Treatment: Right knee immobilizer Activity Tolerance: Patient limited by fatigue Patient left: in chair;with family/visitor present;with call bell in reach Nurse Communication: Mobility status for transfers General Behavior During Session: [facility] for tasks performed Cognition: [facility] for tasks performed  [name] 06/26/2011, 4:27 PM

## 2011-06-27 ENCOUNTER — Inpatient Hospital Stay
Admission: RE | Admit: 2011-06-27 | Discharge: 2011-07-04 | Disposition: A | Discharge status: Home / Self Care | Payer: Medicare Other | Source: Ambulatory Visit | Attending: Family Medicine | Admitting: Family Medicine

## 2011-06-27 LAB — CBC
HCT: 30.3 % — ABNORMAL LOW (ref 39.0–52.0)
Hemoglobin: 10 g/dL — ABNORMAL LOW (ref 13.0–17.0)
MCV: 84.2 fL (ref 78.0–100.0)
Platelets: 226 10*3/uL (ref 150–400)
RBC: 3.6 MIL/uL — ABNORMAL LOW (ref 4.22–5.81)
WBC: 9 10*3/uL (ref 4.0–10.5)

## 2011-06-27 MED ORDER — POLYETHYLENE GLYCOL 3350 17 G PO PACK: 17.0000 g | PACK | Freq: Every day | ORAL | Status: AC | PRN

## 2011-06-27 MED ORDER — ACETAMINOPHEN 325 MG PO TABS: 650.0000 mg | ORAL_TABLET | Freq: Four times a day (QID) | ORAL | Status: DC | PRN

## 2011-06-27 MED ORDER — HYDROCODONE-ACETAMINOPHEN 5-325 MG PO TABS: 1.0000 | ORAL_TABLET | ORAL | Status: AC | PRN

## 2011-06-27 MED ORDER — METHOCARBAMOL 500 MG PO TABS: 500.0000 mg | ORAL_TABLET | Freq: Four times a day (QID) | ORAL | Status: AC | PRN

## 2011-06-27 MED ORDER — BISACODYL 10 MG RE SUPP: 10.0000 mg | Freq: Every day | RECTAL | Status: AC | PRN

## 2011-06-27 MED ORDER — RIVAROXABAN 10 MG PO TABS: 10.0000 mg | ORAL_TABLET | Freq: Every day | ORAL | Status: DC

## 2011-06-27 MED ORDER — DSS 100 MG PO CAPS: 100.0000 mg | ORAL_CAPSULE | Freq: Two times a day (BID) | ORAL | Status: AC

## 2011-06-27 NOTE — Progress Notes (Signed)
Subjective: 3 Days Post-Op Procedure(s) (LRB): TOTAL KNEE ARTHROPLASTY (Right) Patient reports pain as mild.   Patient seen in rounds with Dr. Lequita Halt. Patient is ready for transfer to [facility].  Objective: Vital signs in last 24 hours: Temp:  [98.4 F (36.9 C)-98.5 F (36.9 C)] 98.4 F (36.9 C) (01/17 0605) Pulse Rate:  [82-90] 82  (01/17 0605) Resp:  [16-18] 16  (01/17 0605) BP: (108-116)/(56-67) 108/67 mmHg (01/17 0605) SpO2:  [85 %-98 %] 98 % (01/17 0605)  Intake/Output from previous day:  Intake/Output Summary (Last 24 hours) at 06/27/11 0846 Last data filed at 06/27/11 0605  Gross per 24 hour  Intake   1080 ml  Output    725 ml  Net    355 ml    Intake/Output this shift:    Labs: Results for orders placed during the hospital encounter of 06/24/11  CBC      Component Value Range   WBC 6.3  4.0 - 10.5 (K/uL)   RBC 3.67 (*) 4.22 - 5.81 (MIL/uL)   Hemoglobin 10.5 (*) 13.0 - 17.0 (g/dL)   HCT 16.1 (*) 09.6 - 52.0 (%)   MCV 84.7  78.0 - 100.0 (fL)   MCH 28.6  26.0 - 34.0 (pg)   MCHC 33.8  30.0 - 36.0 (g/dL)   RDW 04.5  40.9 - 81.1 (%)   Platelets 183  150 - 400 (K/uL)  BASIC METABOLIC PANEL      Component Value Range   Sodium 135  135 - 145 (mEq/L)   Potassium 3.8  3.5 - 5.1 (mEq/L)   Chloride 102  96 - 112 (mEq/L)   CO2 27  19 - 32 (mEq/L)   Glucose, Bld 141 (*) 70 - 99 (mg/dL)   BUN 7  6 - 23 (mg/dL)   Creatinine, Ser 9.14  0.50 - 1.35 (mg/dL)   Calcium 8.3 (*) 8.4 - 10.5 (mg/dL)   GFR calc non Af Amer 81 (*) >90 (mL/min)   GFR calc Af Amer >90  >90 (mL/min)  CBC      Component Value Range   WBC 8.4  4.0 - 10.5 (K/uL)   RBC 3.62 (*) 4.22 - 5.81 (MIL/uL)   Hemoglobin 10.2 (*) 13.0 - 17.0 (g/dL)   HCT 78.2 (*) 95.6 - 52.0 (%)   MCV 84.5  78.0 - 100.0 (fL)   MCH 28.2  26.0 - 34.0 (pg)   MCHC 33.3  30.0 - 36.0 (g/dL)   RDW 21.3  08.6 - 57.8 (%)   Platelets 216  150 - 400 (K/uL)  BASIC METABOLIC PANEL      Component Value Range   Sodium 137  135 -  145 (mEq/L)   Potassium 3.8  3.5 - 5.1 (mEq/L)   Chloride 103  96 - 112 (mEq/L)   CO2 27  19 - 32 (mEq/L)   Glucose, Bld 100 (*) 70 - 99 (mg/dL)   BUN 6  6 - 23 (mg/dL)   Creatinine, Ser 4.69  0.50 - 1.35 (mg/dL)   Calcium 8.9  8.4 - 62.9 (mg/dL)   GFR calc non Af Amer 80 (*) >90 (mL/min)   GFR calc Af Amer >90  >90 (mL/min)  CBC      Component Value Range   WBC 9.0  4.0 - 10.5 (K/uL)   RBC 3.60 (*) 4.22 - 5.81 (MIL/uL)   Hemoglobin 10.0 (*) 13.0 - 17.0 (g/dL)   HCT 52.8 (*) 41.3 - 52.0 (%)   MCV 84.2  78.0 -  100.0 (fL)   MCH 27.8  26.0 - 34.0 (pg)   MCHC 33.0  30.0 - 36.0 (g/dL)   RDW 11.9  14.7 - 82.9 (%)   Platelets 226  150 - 400 (K/uL)    Exam: Neurovascular intact Sensation intact distally Incision - clean, dry, no drainage Motor function intact - moving foot and toes well on exam.   Assessment/Plan: 3 Days Post-Op Procedure(s) (LRB): TOTAL KNEE ARTHROPLASTY (Right) Discharge to SNF [facility] Procedure(s) (LRB): TOTAL KNEE ARTHROPLASTY (Right) Past Medical History  Diagnosis Date  . PONV (postoperative nausea and vomiting)   . Hypothyroidism   . Hepatitis     age 78 years  . GERD (gastroesophageal reflux disease)   . Arthritis     back,shoulders and hips  . Cancer     basal cell/ Melanoma 1997 left  shoulder/  PROSTATE    Diet - heart healthy Follow up - in 2 weeks Activity - WBAT Condition Upon Discharge - Good D/C Meds - Vicodin, Robaxin DVT Prophylaxis - Xarelto Protocol   PERKINS, ALEXZANDREW 06/27/2011, 8:46 AM

## 2011-06-27 NOTE — Progress Notes (Signed)
  CARE MANAGEMENT NOTE 06/27/2011  Patient:  Evan Miller, Evan Miller   Account Number:  [id number]  Date Initiated:  06/27/2011  Documentation initiated by:  Colleen Can  Subjective/Objective Assessment:   dx osteoarthritis knee-right: total knee replacemnt     Action/Plan:   Plans are for snf for rehab   Anticipated DC Date:  06/27/2011   Anticipated DC Plan:  SKILLED NURSING FACILITY  In-house referral  Clinical Social Worker      DC Planning Services  NA      [facility] Choice  NA   Choice offered to / List presented to:  NA   DME arranged  NA      DME agency  NA     HH arranged  NA      HH agency  NA   Status of service:  Completed, signed off Medicare Important Message given?  NA - LOS <3 / Initial given by admissions (If response is "NO", the following Medicare IM given date fields will be blank) Date Medicare IM given:   Date Additional Medicare IM given:    Discharge Disposition:  SKILLED NURSING FACILITY  Per UR Regulation:    Comments:

## 2011-06-27 NOTE — Progress Notes (Signed)
Physical Therapy Treatment Patient Details Name: Evan Miller MRN: [id number] DOB: Jun 21, 1925 Today's Date: 06/27/2011 1135-1145 1g PT Assessment/Plan  PT - Assessment/Plan Comments on Treatment Session: pt improving, still unsteady gait PT Plan: Discharge plan remains appropriate;Frequency remains appropriate PT Frequency: 7X/week Recommendations for Other Services: OT consult Follow Up Recommendations: Skilled nursing facility Equipment Recommended: Defer to next venue PT Goals  Acute Rehab PT Goals PT Goal Formulation: With patient/family Time For Goal Achievement: 7 days Pt will go Supine/Side to Sit: with supervision PT Goal: Supine/Side to Sit - Progress: Progressing toward goal Pt will go Sit to Supine/Side: with supervision PT Goal: Sit to Supine/Side - Progress: Progressing toward goal Pt will go Sit to Stand: with supervision PT Goal: Sit to Stand - Progress: Progressing toward goal Pt will go Stand to Sit: with supervision PT Goal: Stand to Sit - Progress: Progressing toward goal Pt will Transfer Bed to Chair/Chair to Bed: with supervision PT Transfer Goal: Bed to Chair/Chair to Bed - Progress: Progressing toward goal Pt will Ambulate: 51 - 150 feet;with min assist;with rolling walker PT Goal: Ambulate - Progress: Progressing toward goal PT Goal: Up/Down Stairs - Progress: Discontinued (comment) Pt will Perform Home Exercise Program: with supervision, verbal cues required/provided PT Goal: Perform Home Exercise Program - Progress: Progressing toward goal  PT Treatment Precautions/Restrictions  Precautions Precautions: Knee Required Braces or Orthoses: Yes Knee Immobilizer: Discontinue once straight leg raise with < 10 degree lag Restrictions Weight Bearing Restrictions: No Mobility (including Balance) Bed Mobility Bed Mobility: Yes Supine to Sit: 4: Min assist;With rails;HOB elevated (Comment degrees) Sit to Supine: 4: Min assist Transfers Transfers:  Yes Sit to Stand: With upper extremity assist;From chair/3-in-1;4: Min assist Sit to Stand Details (indicate cue type and reason): vc to push from arms Stand to Sit: To chair/3-in-1;With upper extremity assist;4: Min assist Stand to Sit Details: vc to step RLE forward Ambulation/Gait Ambulation/Gait: Yes Ambulation/Gait Assistance: 4: Min assist Ambulation/Gait Assistance Details (indicate cue type and reason): vc for step length and position of RW, tends to place too far ahead Ambulation Distance (Feet): 75 Feet Assistive device: Rolling walker Gait Pattern: Step-to pattern;Decreased weight shift to right;Decreased step length - right Gait velocity: unsteady  Posture/Postural Control Posture/Postural Control: No significant limitations Postural Limitations: pt is forward flexed Exercise   End of Session PT - End of Session Equipment Utilized During Treatment:  (pt did not use) Activity Tolerance: Patient tolerated treatment well Patient left: in chair;with family/visitor present;with call bell in reach Nurse Communication: Mobility status for transfers General Behavior During Session: [facility] for tasks performed Cognition: [facility] for tasks performed  [name] 06/27/2011, 2:24 PM

## 2011-06-27 NOTE — Discharge Summary (Signed)
Physician Discharge Summary   Patient ID: Evan Miller MRN: [id number] DOB/AGE: 11/12/1925 78 y.o.  Admit date: 06/24/2011 Discharge date: 06/27/2011  Primary Diagnosis: Osteoarthritis Right Knee  Admission Diagnoses: Past Medical History  Diagnosis Date  . PONV (postoperative nausea and vomiting)   . Hypothyroidism   . Hepatitis     age 78 years  . GERD (gastroesophageal reflux disease)   . Arthritis     back,shoulders and hips  . Cancer     basal cell/ Melanoma 1997 left  shoulder/  PROSTATE   Discharge Diagnoses:  Principal Problem:  *OA (osteoarthritis) of knee  Procedure: Procedure(s) (LRB): TOTAL KNEE ARTHROPLASTY (Right)   Consults: none  HPI: Evan Miller is a 78 y.o. year old male with end stage OA of his right knee with progressively worsening pain and dysfunction. He has constant pain, with activity and at rest and significant functional deficits with difficulties even with ADLs. He has had extensive non-op management including analgesics, injections of cortisone and viscosupplements, and home exercise program, but remains in significant pain with significant dysfunction. He has bone on bone arthritis with bony erosions of his medial tibia and a significant varus deformity. He presents now for right Total Knee Arthroplasty.   Laboratory Data: Hospital Outpatient Visit on 06/17/2011  Component Date Value Range Status  . aPTT (seconds) 06/17/2011 36  24-37 Final  . WBC (K/uL) 06/17/2011 6.0  4.0-10.5 Final  . RBC (MIL/uL) 06/17/2011 4.80  4.22-5.81 Final  . Hemoglobin (g/dL) 40/98/1191 47.8  29.5-62.1 Final  . HCT (%) 06/17/2011 40.4  39.0-52.0 Final  . MCV (fL) 06/17/2011 84.2  78.0-100.0 Final  . MCH (pg) 06/17/2011 27.7  26.0-34.0 Final  . MCHC (g/dL) 30/86/5784 69.6  29.5-28.4 Final  . RDW (%) 06/17/2011 15.2  11.5-15.5 Final  . Platelets (K/uL) 06/17/2011 297  150-400 Final  . Sodium (mEq/L) 06/17/2011 139  135-145 Final  . Potassium (mEq/L)  06/17/2011 4.1  3.5-5.1 Final  . Chloride (mEq/L) 06/17/2011 101  96-112 Final  . CO2 (mEq/L) 06/17/2011 28  19-32 Final  . Glucose, Bld (mg/dL) 13/24/4010 88  27-25 Final  . BUN (mg/dL) 36/64/4034 12  7-42 Final  . Creatinine, Ser (mg/dL) 59/56/3875 6.43  3.29-5.18 Final  . Calcium (mg/dL) 84/16/6063 9.8  0.1-60.1 Final  . Total Protein (g/dL) 09/32/3557 7.2  3.2-2.0 Final  . Albumin (g/dL) 25/42/7062 4.0  3.7-6.2 Final  . AST (U/L) 06/17/2011 17  0-37 Final  . ALT (U/L) 06/17/2011 13  0-53 Final  . Alkaline Phosphatase (U/L) 06/17/2011 89  39-117 Final  . Total Bilirubin (mg/dL) 83/15/1761 0.3  6.0-7.3 Final  . GFR calc non Af Amer (mL/min) 06/17/2011 79* >90 Final  . GFR calc Af Amer (mL/min) 06/17/2011 >90  >90 Final   Comment:                                 The eGFR has been calculated                          using the CKD EPI equation.                          This calculation has not been                          validated in  all clinical                          situations.                          eGFR's persistently                          <90 mL/min signify                          possible Chronic Kidney Disease.  Marland Kitchen Prothrombin Time (seconds) 06/17/2011 13.5  11.6-15.2 Final  . INR  06/17/2011 1.01  0.00-1.49 Final  . ABO/RH(D)  06/17/2011 A POS   Final  . Antibody Screen  06/17/2011 NEG   Final  . Sample Expiration  06/17/2011 06/27/2011   Final  . Color, Urine  06/17/2011 YELLOW  YELLOW Final  . APPearance  06/17/2011 CLEAR  CLEAR Final  . Specific Gravity, Urine  06/17/2011 1.009  1.005-1.030 Final  . pH  06/17/2011 5.5  5.0-8.0 Final  . Glucose, UA (mg/dL) 45/40/9811 NEGATIVE  NEGATIVE Final  . Hgb urine dipstick  06/17/2011 NEGATIVE  NEGATIVE Final  . Bilirubin Urine  06/17/2011 NEGATIVE  NEGATIVE Final  . Ketones, ur (mg/dL) 91/47/8295 NEGATIVE  NEGATIVE Final  . Protein, ur (mg/dL) 62/13/0865 NEGATIVE  NEGATIVE Final  . Urobilinogen, UA (mg/dL) 78/46/9629  0.2  5.2-8.4 Final  . Nitrite  06/17/2011 NEGATIVE  NEGATIVE Final  . Leukocytes, UA  06/17/2011 NEGATIVE  NEGATIVE Final   MICROSCOPIC NOT DONE ON URINES WITH NEGATIVE PROTEIN, BLOOD, LEUKOCYTES, NITRITE, OR GLUCOSE <1000 mg/dL.  Marland Kitchen MRSA, PCR  06/17/2011 NEGATIVE  NEGATIVE Final  . Staphylococcus aureus  06/17/2011 NEGATIVE  NEGATIVE Final   Comment:                                 The Xpert SA Assay (FDA                          approved for NASAL specimens                          only), is one component of                          a comprehensive surveillance                          program.  It is not intended                          to diagnose infection nor to                          guide or monitor treatment.    Basename 06/27/11 0428 06/26/11 0430 06/25/11 0430  HGB 10.0* 10.2* 10.5*    Basename 06/27/11 0428 06/26/11 0430  WBC 9.0 8.4  RBC 3.60* 3.62*  HCT 30.3* 30.6*  PLT 226 216    Basename 06/26/11 0430 06/25/11 0430  NA 137 135  K 3.8 3.8  CL 103 102  CO2 27 27  BUN  6 7  CREATININE 0.80 0.79  GLUCOSE 100* 141*  CALCIUM 8.9 8.3*   No results found for this basename: LABPT:2,INR:2 in the last 72 hours  X-Rays:No results found.  EKG: Orders placed in visit on 06/24/11  . EKG 12-LEAD     Hospital Course: Patient was admitted to [facility] and taken to the OR and underwent the above state procedure without complications.  Patient tolerated the procedure well and was later transferred to the recovery room and then to the orthopaedic floor for postoperative care.  They were given PO and IV analgesics for pain control following their surgery.  They were given 24 hours of postoperative antibiotics and started on DVT prophylaxis.   PT and OT were ordered for total joint protocol.  Discharge planning consulted to help with postop disposition and equipment needs.  Patient had a decent night on the evening of surgery and started to get up with therapy on  day one.  PCA was discontinued and they were weaned over to PO meds.  Hemovac drain was pulled without difficulty.  Continued to progress with therapy into day two.  Dressing was changed on day two and the incision was healing well. The patient wanted to look into [facility] again so we got social worker involved.  By day three, the patient had progressed with therapy and meeting goals.  Incision was healing well.  It was noted that a bed was available. Patient was seen in rounds and was ready for transfer.   Discharge Medications: Prior to Admission medications   Medication Sig Start Date End Date Taking? Authorizing Provider  levothyroxine (SYNTHROID, LEVOTHROID) 100 MCG tablet Take 100 mcg by mouth daily at 6 (six) AM.    Yes Historical Provider, MD  acetaminophen (TYLENOL) 325 MG tablet Take 2 tablets (650 mg total) by mouth every 6 (six) hours as needed (or Fever >/= 101). 06/27/11 06/26/12  Analiese Krupka, PA  bisacodyl (DULCOLAX) 10 MG suppository Place 1 suppository (10 mg total) rectally daily as needed. 06/27/11 07/07/11  Zynasia Burklow, PA  docusate sodium 100 MG CAPS Take 100 mg by mouth 2 (two) times daily. 06/27/11 07/07/11  Jobe Mutch, PA  HYDROcodone-acetaminophen (NORCO) 5-325 MG per tablet Take 1-2 tablets by mouth every 4 (four) hours as needed. 06/27/11 07/07/11  Ethelean Colla, PA  methocarbamol (ROBAXIN) 500 MG tablet Take 1 tablet (500 mg total) by mouth every 6 (six) hours as needed. 06/27/11 07/07/11  Meric Joye, PA  polyethylene glycol (MIRALAX / GLYCOLAX) packet Take 17 g by mouth daily as needed. 06/27/11 06/30/11  Jamerson Vonbargen Julien Girt, PA  rivaroxaban (XARELTO) 10 MG TABS tablet Take 1 tablet (10 mg total) by mouth daily with breakfast. Take for a total of three weeks and then discontinue.  May resume Baby Aspirin 81 mg after completing the three weeks of Xarelto. 06/27/11   Leyton Brownlee, PA Take for three weeks (18 more days) and the discontinue.   May resume Baby Aspirin 81 mg after completion of Xarleto.    Diet: heart healthy  Activity:WBAT   Follow-up:in 2 weeks  Disposition: [facility]  Discharged Condition: good   Discharge Orders    Future Orders Please Complete By Expires   Diet - low sodium heart healthy      Call MD / Call 911      Comments:   If you experience chest pain or shortness of breath, CALL 911 and be transported to the hospital emergency room.  If you develope a  fever above 101 F, pus (white drainage) or increased drainage or redness at the wound, or calf pain, call your surgeon's office.   Constipation Prevention      Comments:   Drink plenty of fluids.  Prune juice may be helpful.  You may use a stool softener, such as Colace (over the counter) 100 mg twice a day.  Use MiraLax (over the counter) for constipation as needed.   Increase activity slowly as tolerated      Weight Bearing as taught in Physical Therapy      Comments:   Use a walker or crutches as instructed.   Discharge instructions      Comments:   Pick up stool softner and laxative for home. Do not submerge incision under water. May shower. Continue to use ice for pain and swelling from surgery.    Driving restrictions      Comments:   No driving   Lifting restrictions      Comments:   No lifting   TED hose      Comments:   Use stockings (TED hose) for 3 weeks on both leg(s).  You may remove them at night for sleeping.   Change dressing      Comments:   Change dressing daily with sterile 4 x 4 inch gauze dressing and apply TED hose.   Do not put a pillow under the knee. Place it under the heel.        Current Discharge Medication List    START taking these medications   Details  acetaminophen (TYLENOL) 325 MG tablet Take 2 tablets (650 mg total) by mouth every 6 (six) hours as needed (or Fever >/= 101). Qty: 60 tablet, Refills: 0    bisacodyl (DULCOLAX) 10 MG suppository Place 1 suppository (10 mg total) rectally daily  as needed. Qty: 12 suppository, Refills: 0    docusate sodium 100 MG CAPS Take 100 mg by mouth 2 (two) times daily. Qty: 30 capsule, Refills: 0    HYDROcodone-acetaminophen (NORCO) 5-325 MG per tablet Take 1-2 tablets by mouth every 4 (four) hours as needed. Qty: 80 tablet, Refills: 0    methocarbamol (ROBAXIN) 500 MG tablet Take 1 tablet (500 mg total) by mouth every 6 (six) hours as needed. Qty: 80 tablet, Refills: 0    polyethylene glycol (MIRALAX / GLYCOLAX) packet Take 17 g by mouth daily as needed. Qty: 14 each, Refills: 0    rivaroxaban (XARELTO) 10 MG TABS tablet Take 1 tablet (10 mg total) by mouth daily with breakfast. Take for a total of three weeks and then discontinue.  May resume Baby Aspirin 81 mg after completing the three weeks of Xarelto. Qty: 18 tablet, Refills: 0      CONTINUE these medications which have NOT CHANGED   Details  levothyroxine (SYNTHROID, LEVOTHROID) 100 MCG tablet Take 100 mcg by mouth daily at 6 (six) AM.       STOP taking these medications     diclofenac (VOLTAREN) 75 MG EC tablet      fish oil-omega-3 fatty acids 1000 MG capsule      HYDROcodone-acetaminophen (VICODIN) 5-500 MG per tablet      aspirin EC 81 MG tablet        Follow-up Information    Follow up with Loanne Drilling, MD. Schedule an appointment as soon as possible for a visit in 2 weeks. (Please have [facility] help make arrangements for follow up and transportation.  )    Contact information:  [facility] [address], [address] [place] [address] [phone]          Signed: Patrica Duel 06/27/2011, 8:57 AM

## 2011-06-27 NOTE — Progress Notes (Signed)
Physical Therapy Treatment Patient Details Name: Evan Miller MRN: [id number] DOB: 1926-05-27 Today's Date: 06/27/2011 [id number] [place] PT Assessment/Plan  PT - Assessment/Plan Comments on Treatment Session: pt improving, still unsteady gait PT Plan: Discharge plan remains appropriate;Frequency remains appropriate PT Frequency: 7X/week Recommendations for Other Services: OT consult Follow Up Recommendations: Skilled nursing facility Equipment Recommended: Defer to next venue PT Goals  Acute Rehab PT Goals PT Goal Formulation: With patient/family Time For Goal Achievement: 7 days Pt will go Supine/Side to Sit: with supervision PT Goal: Supine/Side to Sit - Progress: Progressing toward goal Pt will go Sit to Supine/Side: with supervision PT Goal: Sit to Supine/Side - Progress: Progressing toward goal Pt will go Sit to Stand: with supervision PT Goal: Sit to Stand - Progress: Progressing toward goal Pt will go Stand to Sit: with supervision PT Goal: Stand to Sit - Progress: Progressing toward goal Pt will Transfer Bed to Chair/Chair to Bed: with supervision PT Transfer Goal: Bed to Chair/Chair to Bed - Progress: Progressing toward goal Pt will Ambulate: 51 - 150 feet;with min assist;with rolling walker PT Goal: Ambulate - Progress: Progressing toward goal PT Goal: Up/Down Stairs - Progress: Discontinued (comment) Pt will Perform Home Exercise Program: with supervision, verbal cues required/provided PT Goal: Perform Home Exercise Program - Progress: Progressing toward goal  PT Treatment Precautions/Restrictions  Precautions Precautions: Knee Required Braces or Orthoses: Yes Knee Immobilizer: Discontinue once straight leg raise with < 10 degree lag Restrictions Weight Bearing Restrictions: No Mobility (including Balance) Bed Mobility Bed Mobility: No Supine to Sit:  (pt had gotten self to EOB) Transfers Transfers: Yes Sit to Stand: 3: Mod assist;With upper extremity  assist;From elevated surface;From bed;From chair/3-in-1 Sit to Stand Details (indicate cue type and reason): vc to push from arms Stand to Sit: To chair/3-in-1;With upper extremity assist;4: Min assist Stand to Sit Details: vc/tc to place RLE forward Ambulation/Gait Ambulation/Gait: Yes Ambulation/Gait Assistance: 3: Mod assist Ambulation/Gait Assistance Details (indicate cue type and reason): vc for posture  Ambulation Distance (Feet): 5 Feet Assistive device: Rolling walker Gait Pattern: Step-to pattern;Decreased weight shift to right;Decreased step length - right Gait velocity: unsteady  Posture/Postural Control Posture/Postural Control: Postural limitations Postural Limitations: pt is forward flexed Exercise  Total Joint Exercises Ankle Circles/Pumps: AROM;Right;10 reps;Supine Quad Sets: AAROM;Right;10 reps Heel Slides: AROM;Right;10 reps;Supine Hip ABduction/ADduction: AROM;Right;10 reps;Supine Straight Leg Raises: AAROM;Right;10 reps;Supine Long Arc Quad: AROM;Right;10 reps;Supine End of Session PT - End of Session Equipment Utilized During Treatment:  (pt did not use) Activity Tolerance: Patient tolerated treatment well Patient left: in chair;with family/visitor present;with call bell in reach Nurse Communication: Mobility status for transfers General Behavior During Session: [facility] for tasks performed Cognition: [facility] for tasks performed  [name] 06/27/2011, 2:20 PM

## 2011-12-04 NOTE — Patient Instructions (Addendum)
Your procedure is scheduled on: 12/16/2011  Report to [facility] at  1000       AM.  Call this number if you have problems the morning of surgery: [phone]   Do not eat food or drink liquids :After Midnight.      Take these medicines the morning of surgery with A SIP OF WATER:levothyroxine,vicodin   Do not wear jewelry, make-up or nail polish.  Do not wear lotions, powders, or perfumes. You may wear deodorant.  Do not shave 48 hours prior to surgery.  Do not bring valuables to the hospital.  Contacts, dentures or bridgework may not be worn into surgery.  Leave suitcase in the car. After surgery it may be brought to your room.  For patients admitted to the hospital, checkout time is 11:00 AM the day of discharge.   Patients discharged the day of surgery will not be allowed to drive home.  :     Please read over the following fact sheets that you were given: Coughing and Deep Breathing, Surgical Site Infection Prevention, Anesthesia Post-op Instructions and Care and Recovery After Surgery    Cataract A cataract is a clouding of the lens of the eye. When a lens becomes cloudy, vision is reduced based on the degree and nature of the clouding. Many cataracts reduce vision to some degree. Some cataracts make people more near-sighted as they develop. Other cataracts increase glare. Cataracts that are ignored and become worse can sometimes look white. The white color can be seen through the pupil. CAUSES   Aging. However, cataracts may occur at any age, even in newborns.   Certain drugs.   Trauma to the eye.   Certain diseases such as diabetes.   Specific eye diseases such as chronic inflammation inside the eye or a sudden attack of a rare form of glaucoma.   Inherited or acquired medical problems.  SYMPTOMS   Gradual, progressive drop in vision in the affected eye.   Severe, rapid visual loss. This most often happens when trauma is the cause.  DIAGNOSIS  To detect a cataract, an  eye doctor examines the lens. Cataracts are best diagnosed with an exam of the eyes with the pupils enlarged (dilated) by drops.  TREATMENT  For an early cataract, vision may improve by using different eyeglasses or stronger lighting. If that does not help your vision, surgery is the only effective treatment. A cataract needs to be surgically removed when vision loss interferes with your everyday activities, such as driving, reading, or watching TV. A cataract may also have to be removed if it prevents examination or treatment of another eye problem. Surgery removes the cloudy lens and usually replaces it with a substitute lens (intraocular lens, IOL).  At a time when both you and your doctor agree, the cataract will be surgically removed. If you have cataracts in both eyes, only one is usually removed at a time. This allows the operated eye to heal and be out of danger from any possible problems after surgery (such as infection or poor wound healing). In rare cases, a cataract may be doing damage to your eye. In these cases, your caregiver may advise surgical removal right away. The vast majority of people who have cataract surgery have better vision afterward. HOME CARE INSTRUCTIONS  If you are not planning surgery, you may be asked to do the following:  Use different eyeglasses.   Use stronger or brighter lighting.   Ask your eye doctor about reducing  your medicine dose or changing medicines if it is thought that a medicine caused your cataract. Changing medicines does not make the cataract go away on its own.   Become familiar with your surroundings. Poor vision can lead to injury. Avoid bumping into things on the affected side. You are at a higher risk for tripping or falling.   Exercise extreme care when driving or operating machinery.   Wear sunglasses if you are sensitive to bright light or experiencing problems with glare.  SEEK IMMEDIATE MEDICAL CARE IF:   You have a worsening or sudden  vision loss.   You notice redness, swelling, or increasing pain in the eye.   You have a fever.  Document Released: 05/27/2005 Document Revised: 05/16/2011 Document Reviewed: 01/18/2011 [facility] Patient Information 2012 [place], [place].PATIENT INSTRUCTIONS POST-ANESTHESIA  IMMEDIATELY FOLLOWING SURGERY:  Do not drive or operate machinery for the first twenty four hours after surgery.  Do not make any important decisions for twenty four hours after surgery or while taking narcotic pain medications or sedatives.  If you develop intractable nausea and vomiting or a severe headache please notify your doctor immediately.  FOLLOW-UP:  Please make an appointment with your surgeon as instructed. You do not need to follow up with anesthesia unless specifically instructed to do so.  WOUND CARE INSTRUCTIONS (if applicable):  Keep a dry clean dressing on the anesthesia/puncture wound site if there is drainage.  Once the wound has quit draining you may leave it open to air.  Generally you should leave the bandage intact for twenty four hours unless there is drainage.  If the epidural site drains for more than 36-48 hours please call the anesthesia department.  QUESTIONS?:  Please feel free to call your physician or the hospital operator if you have any questions, and they will be happy to assist you.

## 2011-12-05 ENCOUNTER — Encounter (HOSPITAL_COMMUNITY)
Admission: RE | Admit: 2011-12-05 | Discharge: 2011-12-05 | Disposition: A | Discharge status: Home / Self Care | Payer: Medicare Other | Source: Ambulatory Visit | Attending: Ophthalmology | Admitting: Ophthalmology

## 2011-12-05 ENCOUNTER — Encounter (HOSPITAL_COMMUNITY): Payer: Self-pay

## 2011-12-05 LAB — BASIC METABOLIC PANEL
BUN: 12 mg/dL (ref 6–23)
Calcium: 9.7 mg/dL (ref 8.4–10.5)
Creatinine, Ser: 0.89 mg/dL (ref 0.50–1.35)
GFR calc non Af Amer: 76 mL/min — ABNORMAL LOW (ref >90)
Glucose, Bld: 151 mg/dL — ABNORMAL HIGH (ref 70–99)
Sodium: 139 mEq/L (ref 135–145)

## 2011-12-05 LAB — HEMOGLOBIN AND HEMATOCRIT, BLOOD: HCT: 38.3 % — ABNORMAL LOW (ref 39.0–52.0)

## 2011-12-13 MED ORDER — NEOMYCIN-POLYMYXIN-DEXAMETH 3.5-10000-0.1 OP OINT
TOPICAL_OINTMENT | OPHTHALMIC | Status: AC
  Filled 2011-12-13: qty 3.5

## 2011-12-13 MED ORDER — LIDOCAINE HCL (PF) 1 % IJ SOLN
INTRAMUSCULAR | Status: AC
  Filled 2011-12-13: qty 2

## 2011-12-13 MED ORDER — TETRACAINE HCL 0.5 % OP SOLN
OPHTHALMIC | Status: AC
  Filled 2011-12-13: qty 2

## 2011-12-13 MED ORDER — PHENYLEPHRINE HCL 2.5 % OP SOLN
OPHTHALMIC | Status: AC
  Filled 2011-12-13: qty 2

## 2011-12-13 MED ORDER — CYCLOPENTOLATE-PHENYLEPHRINE 0.2-1 % OP SOLN
OPHTHALMIC | Status: AC
  Filled 2011-12-13: qty 2

## 2011-12-13 MED ORDER — LIDOCAINE HCL 3.5 % OP GEL
OPHTHALMIC | Status: AC
  Filled 2011-12-13: qty 5

## 2011-12-16 ENCOUNTER — Encounter (HOSPITAL_COMMUNITY)
Admission: RE | Disposition: A | Discharge status: Home / Self Care | Payer: Self-pay | Source: Ambulatory Visit | Attending: Ophthalmology

## 2011-12-16 ENCOUNTER — Encounter (HOSPITAL_COMMUNITY): Payer: Self-pay | Admitting: Ophthalmology

## 2011-12-16 ENCOUNTER — Ambulatory Visit (HOSPITAL_COMMUNITY)
Admission: RE | Admit: 2011-12-16 | Discharge: 2011-12-16 | Disposition: A | Discharge status: Home / Self Care | Payer: Medicare Other | Source: Ambulatory Visit | Attending: Ophthalmology | Admitting: Ophthalmology

## 2011-12-16 ENCOUNTER — Encounter (HOSPITAL_COMMUNITY): Payer: Self-pay | Admitting: Anesthesiology

## 2011-12-16 ENCOUNTER — Encounter (HOSPITAL_COMMUNITY): Payer: Self-pay | Admitting: *Deleted

## 2011-12-16 ENCOUNTER — Ambulatory Visit (HOSPITAL_COMMUNITY): Payer: Medicare Other | Admitting: Anesthesiology

## 2011-12-16 DIAGNOSIS — Z01810 Encounter for preprocedural cardiovascular examination: Secondary | ICD-10-CM | POA: Insufficient documentation

## 2011-12-16 DIAGNOSIS — H2589 Other age-related cataract: Secondary | ICD-10-CM | POA: Insufficient documentation

## 2011-12-16 DIAGNOSIS — Z01812 Encounter for preprocedural laboratory examination: Secondary | ICD-10-CM | POA: Insufficient documentation

## 2011-12-16 HISTORY — PX: CATARACT EXTRACTION W/PHACO: SHX586

## 2011-12-16 SURGERY — PHACOEMULSIFICATION, CATARACT, WITH IOL INSERTION
Anesthesia: Monitor Anesthesia Care | Site: Eye | Laterality: Left | Wound class: Clean

## 2011-12-16 MED ORDER — MIDAZOLAM HCL 2 MG/2ML IJ SOLN
1.0000 mg | INTRAMUSCULAR | Status: DC | PRN
  Administered 2011-12-16: 2 mg via INTRAVENOUS

## 2011-12-16 MED ORDER — LIDOCAINE HCL (PF) 1 % IJ SOLN
INTRAOCULAR | Status: DC | PRN
  Administered 2011-12-16: 12:00:00 via OPHTHALMIC

## 2011-12-16 MED ORDER — LIDOCAINE 3.5 % OP GEL OPTIME - NO CHARGE
OPHTHALMIC | Status: DC | PRN
Start: 2011-12-16 — End: 2011-12-16
  Administered 2011-12-16: 1 [drp] via OPHTHALMIC

## 2011-12-16 MED ORDER — CYCLOPENTOLATE-PHENYLEPHRINE 0.2-1 % OP SOLN
1.0000 [drp] | OPHTHALMIC | Status: AC
  Administered 2011-12-16 (×3): 1 [drp] via OPHTHALMIC

## 2011-12-16 MED ORDER — POVIDONE-IODINE 5 % OP SOLN
OPHTHALMIC | Status: DC | PRN
  Administered 2011-12-16: 1 via OPHTHALMIC

## 2011-12-16 MED ORDER — LACTATED RINGERS IV SOLN
INTRAVENOUS | Status: DC | PRN
  Administered 2011-12-16: 11:00:00 via INTRAVENOUS

## 2011-12-16 MED ORDER — GLYCOPYRROLATE 0.2 MG/ML IJ SOLN
INTRAMUSCULAR | Status: AC
  Filled 2011-12-16: qty 1

## 2011-12-16 MED ORDER — BSS IO SOLN
INTRAOCULAR | Status: DC | PRN
  Administered 2011-12-16: 12:00:00

## 2011-12-16 MED ORDER — LACTATED RINGERS IV SOLN
INTRAVENOUS | Status: DC
  Administered 2011-12-16: 12:00:00 via INTRAVENOUS

## 2011-12-16 MED ORDER — TETRACAINE HCL 0.5 % OP SOLN
1.0000 [drp] | OPHTHALMIC | Status: AC
  Administered 2011-12-16 (×3): 1 [drp] via OPHTHALMIC

## 2011-12-16 MED ORDER — MIDAZOLAM HCL 2 MG/2ML IJ SOLN
INTRAMUSCULAR | Status: AC
  Filled 2011-12-16: qty 2

## 2011-12-16 MED ORDER — EPINEPHRINE HCL 1 MG/ML IJ SOLN
INTRAMUSCULAR | Status: AC
  Filled 2011-12-16: qty 1

## 2011-12-16 MED ORDER — LIDOCAINE HCL 3.5 % OP GEL: 1.0000 "application " | Freq: Once | OPHTHALMIC | Status: DC

## 2011-12-16 MED ORDER — GLYCOPYRROLATE 0.2 MG/ML IJ SOLN
0.2000 mg | Freq: Once | INTRAMUSCULAR | Status: AC
  Administered 2011-12-16: 0.2 mg via INTRAVENOUS

## 2011-12-16 MED ORDER — PROVISC 10 MG/ML IO SOLN
INTRAOCULAR | Status: DC | PRN
Start: 2011-12-16 — End: 2011-12-16
  Administered 2011-12-16: 8.5 mg via INTRAOCULAR

## 2011-12-16 MED ORDER — BSS IO SOLN
INTRAOCULAR | Status: DC | PRN
Start: 2011-12-16 — End: 2011-12-16
  Administered 2011-12-16: 15 mL via INTRAOCULAR

## 2011-12-16 MED ORDER — PHENYLEPHRINE HCL 2.5 % OP SOLN
1.0000 [drp] | OPHTHALMIC | Status: AC
  Administered 2011-12-16 (×3): 1 [drp] via OPHTHALMIC

## 2011-12-16 SURGICAL SUPPLY — 31 items
CAPSULAR TENSION RING-AMO (OPHTHALMIC RELATED) IMPLANT
CLOTH BEACON ORANGE TIMEOUT ST (SAFETY) ×2 IMPLANT
EYE SHIELD UNIVERSAL CLEAR (GAUZE/BANDAGES/DRESSINGS) ×2 IMPLANT
GLOVE BIO SURGEON STRL SZ 6.5 (GLOVE) IMPLANT
GLOVE BIOGEL PI IND STRL 6.5 (GLOVE) ×1 IMPLANT
GLOVE BIOGEL PI IND STRL 7.0 (GLOVE) IMPLANT
GLOVE BIOGEL PI IND STRL 7.5 (GLOVE) IMPLANT
GLOVE BIOGEL PI INDICATOR 6.5 (GLOVE) ×1
GLOVE BIOGEL PI INDICATOR 7.0 (GLOVE)
GLOVE BIOGEL PI INDICATOR 7.5 (GLOVE)
GLOVE ECLIPSE 6.5 STRL STRAW (GLOVE) IMPLANT
GLOVE ECLIPSE 7.0 STRL STRAW (GLOVE) IMPLANT
GLOVE ECLIPSE 7.5 STRL STRAW (GLOVE) IMPLANT
GLOVE EXAM NITRILE LRG STRL (GLOVE) IMPLANT
GLOVE EXAM NITRILE MD LF STRL (GLOVE) ×2 IMPLANT
GLOVE SKINSENSE NS SZ6.5 (GLOVE)
GLOVE SKINSENSE NS SZ7.0 (GLOVE)
GLOVE SKINSENSE STRL SZ6.5 (GLOVE) IMPLANT
GLOVE SKINSENSE STRL SZ7.0 (GLOVE) IMPLANT
KIT VITRECTOMY (OPHTHALMIC RELATED) IMPLANT
PAD ARMBOARD 7.5X6 YLW CONV (MISCELLANEOUS) ×2 IMPLANT
PROC W NO LENS (INTRAOCULAR LENS)
PROC W SPEC LENS (INTRAOCULAR LENS)
PROCESS W NO LENS (INTRAOCULAR LENS) IMPLANT
PROCESS W SPEC LENS (INTRAOCULAR LENS) IMPLANT
RING MALYGIN (MISCELLANEOUS) IMPLANT
SIGHTPATH CAT PROC W REG LENS (Ophthalmic Related) ×2 IMPLANT
SYR TB 1ML LL NO SAFETY (SYRINGE) ×2 IMPLANT
TAPE CLOTH SOFT 2X10 (GAUZE/BANDAGES/DRESSINGS) ×2 IMPLANT
VISCOELASTIC ADDITIONAL (OPHTHALMIC RELATED) IMPLANT
WATER STERILE IRR 250ML POUR (IV SOLUTION) ×2 IMPLANT

## 2011-12-16 NOTE — Preoperative (Signed)
Beta Blockers   Reason not to administer Beta Blockers:Not Applicable 

## 2011-12-16 NOTE — H&P (Signed)
I have reviewed the H&P, the patient was re-examined, and I have identified no interval changes in medical condition and plan of care since the history and physical of record  

## 2011-12-16 NOTE — Anesthesia Procedure Notes (Signed)
Procedure Name: MAC Date/Time: 12/16/2011 12:23 PM Performed by: Carolyne Littles, Keyani Rigdon L Pre-anesthesia Checklist: Patient identified, Patient being monitored, Emergency Drugs available, Timeout performed and Suction available Oxygen Delivery Method: Nasal cannula

## 2011-12-16 NOTE — Brief Op Note (Signed)
Pre-Op Dx: Cataract OS Post-Op Dx: Cataract OS Surgeon: Mikalah Skyles Anesthesia: Topical with MAC Surgery: Cataract Extraction with Intraocular lens Implant OS Implant: Lenstec, Model Softec HD Specimen: None Complications: None 

## 2011-12-16 NOTE — Anesthesia Postprocedure Evaluation (Signed)
  Anesthesia Post-op Note  Patient: Evan Miller  Procedure(s) Performed: Procedure(s) (LRB): CATARACT EXTRACTION PHACO AND INTRAOCULAR LENS PLACEMENT (IOC) (Left)  Patient Location: PACU  Anesthesia Type: MAC  Level of Consciousness: awake, alert , oriented and patient cooperative  Airway and Oxygen Therapy: Patient Spontanous Breathing  Post-op Pain: none  Post-op Assessment: Post-op Vital signs reviewed, Patient's Cardiovascular Status Stable, Respiratory Function Stable and Patent Airway  Post-op Vital Signs: Reviewed and stable  Complications: No apparent anesthesia complications

## 2011-12-16 NOTE — Anesthesia Preprocedure Evaluation (Signed)
Anesthesia Evaluation  Patient identified by MRN, date of birth, ID band Patient awake    Reviewed: Allergy & Precautions, H&P , NPO status , Patient's Chart, lab work & pertinent test results  History of Anesthesia Complications (+) PONV  Airway Mallampati: I TM Distance: >3 FB Neck ROM: Full    Dental  (+) Edentulous Upper and Edentulous Lower   Pulmonary neg pulmonary ROS,    Pulmonary exam normal       Cardiovascular negative cardio ROS  Rhythm:Regular Rate:Normal     Neuro/Psych negative neurological ROS  negative psych ROS   GI/Hepatic GERD-  ,(+) Hepatitis -  Endo/Other  Hypothyroidism   Renal/GU      Musculoskeletal  (+) Arthritis -, Osteoarthritis,    Abdominal Normal abdominal exam  (+)   Peds  Hematology  (+) Blood dyscrasia, anemia ,   Anesthesia Other Findings   Reproductive/Obstetrics                           Anesthesia Physical Anesthesia Plan  ASA: II  Anesthesia Plan: MAC   Post-op Pain Management:    Induction: Intravenous  Airway Management Planned: Nasal Cannula  Additional Equipment:   Intra-op Plan:   Post-operative Plan:   Informed Consent: I have reviewed the patients History and Physical, chart, labs and discussed the procedure including the risks, benefits and alternatives for the proposed anesthesia with the patient or authorized representative who has indicated his/her understanding and acceptance.     Plan Discussed with: CRNA  Anesthesia Plan Comments:         Anesthesia Quick Evaluation

## 2011-12-16 NOTE — Transfer of Care (Signed)
Immediate Anesthesia Transfer of Care Note  Patient: Evan Miller  Procedure(s) Performed: Procedure(s) (LRB): CATARACT EXTRACTION PHACO AND INTRAOCULAR LENS PLACEMENT (IOC) (Left)  Patient Location: PACU  Anesthesia Type: MAC  Level of Consciousness: awake, alert , oriented and patient cooperative  Airway & Oxygen Therapy: Patient Spontanous Breathing and Patient connected to nasal cannula oxygen  Post-op Assessment: Report given to PACU RN and Post -op Vital signs reviewed and stable  Post vital signs: Reviewed and stable  Complications: No apparent anesthesia complications

## 2011-12-17 NOTE — Op Note (Signed)
Evan Miller, Evan Miller             ACCOUNT NO.:  [id number]  MEDICAL RECORD NO.:  [id number]  LOCATION:  [facility]                          FACILITY:  [facility]  PHYSICIAN:  Susanne Greenhouse, MD       DATE OF BIRTH:  [birth year]  DATE OF PROCEDURE:  12/16/2011 DATE OF DISCHARGE:  12/16/2011                              OPERATIVE REPORT   PREOPERATIVE DIAGNOSIS:  Combined cataract, left eye, diagnosis code 366.19.  POSTOPERATIVE DIAGNOSIS:  Combined cataract, left eye, diagnosis code 366.19.  OPERATION PERFORMED:  Phacoemulsification with posterior chamber intraocular lens implantation, left eye.  SURGEON:  Bonne Dolores. Katina Remick, MD  ANESTHESIA:  Topical with IV sedation and monitored anesthesia care.  OPERATIVE SUMMARY:  In the preoperative area, dilating drops were placed into the left eye.  The patient was then brought into the operating room where he was placed under general anesthesia.  The eye was then prepped and draped.  Beginning with a 75 blade, a paracentesis port was made at the surgeon's 2 o'clock position.  The anterior chamber was then filled with a 1% nonpreserved lidocaine solution with epinephrine.  This was followed by Viscoat to deepen the chamber.  A small fornix-based peritomy was performed superiorly.  Next, a single iris hook was placed through the limbus superiorly.  A 2.4-mm keratome blade was then used to make a clear corneal incision over the iris hook.  A bent cystotome needle and Utrata forceps were used to create a continuous tear capsulotomy.  Hydrodissection was performed using balanced salt solution on a fine cannula.  The lens nucleus was then removed using phacoemulsification in a quadrant cracking technique.  The cortical material was then removed with irrigation and aspiration.  The capsular bag and anterior chamber were refilled with Provisc.  The wound was widened to approximately 3 mm and a posterior chamber intraocular lens was placed into the capsular bag  without difficulty using an [employer] lens injecting system.  A single 10-0 nylon suture was then used to close the incision as well as stromal hydration.  The Provisc was removed from the anterior chamber and capsular bag with irrigation and aspiration.  At this point, the wounds were tested for leak, which were negative.  The anterior chamber remained deep and stable.  The patient tolerated the procedure well.  There were no operative complications, and he awoke from general anesthesia without problem.  No surgical specimens.  Prosthetic device used is a Lenstec posterior chamber lens, model Softec HD, power of 21.25, serial number is [id number].          ______________________________ Susanne Greenhouse, MD     KEH/MEDQ  D:  12/16/2011  T:  12/17/2011  Job:  [id number]

## 2011-12-18 ENCOUNTER — Encounter (HOSPITAL_COMMUNITY): Payer: Self-pay | Admitting: Ophthalmology

## 2011-12-23 ENCOUNTER — Encounter (HOSPITAL_COMMUNITY): Payer: Self-pay | Admitting: Pharmacist

## 2011-12-26 ENCOUNTER — Encounter (HOSPITAL_COMMUNITY): Payer: Self-pay

## 2011-12-26 ENCOUNTER — Encounter (HOSPITAL_COMMUNITY)
Admission: RE | Admit: 2011-12-26 | Discharge: 2011-12-26 | Payer: Medicare Other | Source: Ambulatory Visit | Admitting: Ophthalmology

## 2011-12-27 MED ORDER — LIDOCAINE HCL 3.5 % OP GEL
OPHTHALMIC | Status: AC
  Filled 2011-12-27: qty 5

## 2011-12-27 MED ORDER — TETRACAINE HCL 0.5 % OP SOLN
OPHTHALMIC | Status: AC
  Filled 2011-12-27: qty 2

## 2011-12-27 MED ORDER — PHENYLEPHRINE HCL 2.5 % OP SOLN
OPHTHALMIC | Status: AC
  Filled 2011-12-27: qty 2

## 2011-12-27 MED ORDER — NEOMYCIN-POLYMYXIN-DEXAMETH 3.5-10000-0.1 OP OINT
TOPICAL_OINTMENT | OPHTHALMIC | Status: AC
  Filled 2011-12-27: qty 3.5

## 2011-12-27 MED ORDER — CYCLOPENTOLATE-PHENYLEPHRINE 0.2-1 % OP SOLN
OPHTHALMIC | Status: AC
  Filled 2011-12-27: qty 2

## 2011-12-27 MED ORDER — LIDOCAINE HCL (PF) 1 % IJ SOLN
INTRAMUSCULAR | Status: AC
  Filled 2011-12-27: qty 2

## 2011-12-30 ENCOUNTER — Encounter (HOSPITAL_COMMUNITY)
Admission: RE | Disposition: A | Discharge status: Home / Self Care | Payer: Self-pay | Source: Ambulatory Visit | Attending: Ophthalmology

## 2011-12-30 ENCOUNTER — Encounter (HOSPITAL_COMMUNITY): Payer: Self-pay | Admitting: Anesthesiology

## 2011-12-30 ENCOUNTER — Ambulatory Visit (HOSPITAL_COMMUNITY)
Admission: RE | Admit: 2011-12-30 | Discharge: 2011-12-30 | Disposition: A | Discharge status: Home / Self Care | Payer: Medicare Other | Source: Ambulatory Visit | Attending: Ophthalmology | Admitting: Ophthalmology

## 2011-12-30 ENCOUNTER — Encounter (HOSPITAL_COMMUNITY): Payer: Self-pay | Admitting: *Deleted

## 2011-12-30 ENCOUNTER — Ambulatory Visit (HOSPITAL_COMMUNITY): Payer: Medicare Other | Admitting: Anesthesiology

## 2011-12-30 DIAGNOSIS — H2589 Other age-related cataract: Secondary | ICD-10-CM | POA: Insufficient documentation

## 2011-12-30 HISTORY — PX: CATARACT EXTRACTION W/PHACO: SHX586

## 2011-12-30 SURGERY — PHACOEMULSIFICATION, CATARACT, WITH IOL INSERTION
Anesthesia: Monitor Anesthesia Care | Site: Eye | Laterality: Right | Wound class: Clean

## 2011-12-30 MED ORDER — LIDOCAINE HCL (PF) 1 % IJ SOLN
INTRAOCULAR | Status: DC | PRN
  Administered 2011-12-30: 12:00:00 via OPHTHALMIC

## 2011-12-30 MED ORDER — NEOMYCIN-POLYMYXIN-DEXAMETH 0.1 % OP OINT
TOPICAL_OINTMENT | OPHTHALMIC | Status: DC | PRN
  Administered 2011-12-30: 1 via OPHTHALMIC

## 2011-12-30 MED ORDER — PHENYLEPHRINE HCL 2.5 % OP SOLN
1.0000 [drp] | OPHTHALMIC | Status: AC
  Administered 2011-12-30 (×3): 1 [drp] via OPHTHALMIC

## 2011-12-30 MED ORDER — MIDAZOLAM HCL 2 MG/2ML IJ SOLN
INTRAMUSCULAR | Status: AC
  Filled 2011-12-30: qty 2

## 2011-12-30 MED ORDER — KETOROLAC TROMETHAMINE 0.5 % OP SOLN: 1.0000 [drp] | OPHTHALMIC | Status: AC

## 2011-12-30 MED ORDER — TETRACAINE HCL 0.5 % OP SOLN
1.0000 [drp] | OPHTHALMIC | Status: AC
  Administered 2011-12-30 (×3): 1 [drp] via OPHTHALMIC

## 2011-12-30 MED ORDER — ONDANSETRON HCL 4 MG/2ML IJ SOLN: 4.0000 mg | Freq: Once | INTRAMUSCULAR | Status: DC | PRN

## 2011-12-30 MED ORDER — EPINEPHRINE HCL 1 MG/ML IJ SOLN
INTRAMUSCULAR | Status: AC
  Filled 2011-12-30: qty 1

## 2011-12-30 MED ORDER — FENTANYL CITRATE 0.05 MG/ML IJ SOLN: 25.0000 ug | INTRAMUSCULAR | Status: DC | PRN

## 2011-12-30 MED ORDER — LACTATED RINGERS IV SOLN
INTRAVENOUS | Status: DC
  Administered 2011-12-30: 1000 mL via INTRAVENOUS

## 2011-12-30 MED ORDER — MIDAZOLAM HCL 2 MG/2ML IJ SOLN
1.0000 mg | INTRAMUSCULAR | Status: DC | PRN
  Administered 2011-12-30: 2 mg via INTRAVENOUS

## 2011-12-30 MED ORDER — LIDOCAINE HCL 3.5 % OP GEL
1.0000 "application " | Freq: Once | OPHTHALMIC | Status: AC
  Administered 2011-12-30: 1 via OPHTHALMIC

## 2011-12-30 MED ORDER — LIDOCAINE 3.5 % OP GEL OPTIME - NO CHARGE
OPHTHALMIC | Status: DC | PRN
  Administered 2011-12-30: 2 [drp] via OPHTHALMIC

## 2011-12-30 MED ORDER — PROVISC 10 MG/ML IO SOLN
INTRAOCULAR | Status: DC | PRN
  Administered 2011-12-30: 8.5 mg via INTRAOCULAR

## 2011-12-30 MED ORDER — CYCLOPENTOLATE-PHENYLEPHRINE 0.2-1 % OP SOLN
1.0000 [drp] | OPHTHALMIC | Status: AC
  Administered 2011-12-30 (×3): 1 [drp] via OPHTHALMIC

## 2011-12-30 MED ORDER — EPINEPHRINE HCL 1 MG/ML IJ SOLN
INTRAMUSCULAR | Status: AC
  Filled 2011-12-30: qty 2

## 2011-12-30 MED ORDER — EPINEPHRINE HCL 1 MG/ML IJ SOLN
INTRAOCULAR | Status: DC | PRN
  Administered 2011-12-30: 12:00:00

## 2011-12-30 MED ORDER — POVIDONE-IODINE 5 % OP SOLN
OPHTHALMIC | Status: DC | PRN
  Administered 2011-12-30: 1 via OPHTHALMIC

## 2011-12-30 MED ORDER — ACETAMINOPHEN 325 MG PO TABS: 325.0000 mg | ORAL_TABLET | ORAL | Status: DC | PRN

## 2011-12-30 MED ORDER — BSS IO SOLN
INTRAOCULAR | Status: DC | PRN
  Administered 2011-12-30: 15 mL via INTRAOCULAR

## 2011-12-30 SURGICAL SUPPLY — 32 items

## 2011-12-30 NOTE — Anesthesia Procedure Notes (Signed)
Procedure Name: MAC Date/Time: 12/30/2011 12:16 PM Performed by: Carolyne Littles, AMY L Pre-anesthesia Checklist: Patient identified, Patient being monitored, Emergency Drugs available, Timeout performed and Suction available Patient Re-evaluated:Patient Re-evaluated prior to inductionOxygen Delivery Method: Nasal cannula

## 2011-12-30 NOTE — Anesthesia Preprocedure Evaluation (Signed)
Anesthesia Evaluation  Patient identified by MRN, date of birth, ID band Patient awake    Reviewed: Allergy & Precautions, H&P , NPO status , Patient's Chart, lab work & pertinent test results  History of Anesthesia Complications (+) PONV  Airway Mallampati: I TM Distance: >3 FB Neck ROM: Full    Dental  (+) Edentulous Upper and Edentulous Lower   Pulmonary neg pulmonary ROS,    Pulmonary exam normal       Cardiovascular negative cardio ROS  Rhythm:Regular Rate:Normal     Neuro/Psych negative neurological ROS  negative psych ROS   GI/Hepatic GERD-  ,(+) Hepatitis -  Endo/Other  Hypothyroidism   Renal/GU      Musculoskeletal  (+) Arthritis -, Osteoarthritis,    Abdominal Normal abdominal exam  (+)   Peds  Hematology  (+) Blood dyscrasia, anemia ,   Anesthesia Other Findings   Reproductive/Obstetrics                           Anesthesia Physical Anesthesia Plan  ASA: II  Anesthesia Plan: MAC   Post-op Pain Management:    Induction: Intravenous  Airway Management Planned: Nasal Cannula  Additional Equipment:   Intra-op Plan:   Post-operative Plan:   Informed Consent: I have reviewed the patients History and Physical, chart, labs and discussed the procedure including the risks, benefits and alternatives for the proposed anesthesia with the patient or authorized representative who has indicated his/her understanding and acceptance.     Plan Discussed with:   Anesthesia Plan Comments:         Anesthesia Quick Evaluation

## 2011-12-30 NOTE — H&P (Signed)
I have reviewed the H&P, the patient was re-examined, and I have identified no interval changes in medical condition and plan of care since the history and physical of record  

## 2011-12-30 NOTE — Transfer of Care (Signed)
Immediate Anesthesia Transfer of Care Note  Patient: Evan Miller  Procedure(s) Performed: Procedure(s) (LRB): CATARACT EXTRACTION PHACO AND INTRAOCULAR LENS PLACEMENT (IOC) (Right)  Patient Location: PACU  Anesthesia Type: MAC  Level of Consciousness: awake, alert , oriented and patient cooperative  Airway & Oxygen Therapy: Patient Spontanous Breathing  Post-op Assessment: Report given to PACU RN and Post -op Vital signs reviewed and stable  Post vital signs: Reviewed and stable  Complications: No apparent anesthesia complications

## 2011-12-30 NOTE — Brief Op Note (Signed)
Pre-Op Dx: Cataract OD Post-Op Dx: Cataract OD Surgeon: Ryot Burrous Anesthesia: Topical with MAC Surgery: Cataract Extraction with Intraocular lens Implant OD Implant: Lenstec, Model Softec HD Blood Loss: None Specimen: None Complications: None 

## 2011-12-30 NOTE — Anesthesia Postprocedure Evaluation (Signed)
  Anesthesia Post-op Note  Patient: Evan Miller  Procedure(s) Performed: Procedure(s) (LRB): CATARACT EXTRACTION PHACO AND INTRAOCULAR LENS PLACEMENT (IOC) (Right)  Patient Location: PACU  Anesthesia Type: MAC  Level of Consciousness: awake, alert , oriented and patient cooperative  Airway and Oxygen Therapy: Patient Spontanous Breathing  Post-op Pain: none  Post-op Assessment: Post-op Vital signs reviewed, Patient's Cardiovascular Status Stable, Respiratory Function Stable and Patent Airway  Post-op Vital Signs: Reviewed and stable  Complications: No apparent anesthesia complications

## 2011-12-30 NOTE — Addendum Note (Signed)
Addendum  created 12/30/11 1250 by Franco Nones, CRNA   Modules edited:Charges VN

## 2011-12-31 NOTE — Op Note (Signed)
Evan Miller, Evan Miller             ACCOUNT NO.:  [id number]  MEDICAL RECORD NO.:  [id number]  LOCATION:  [facility]                          FACILITY:  [facility]  PHYSICIAN:  Susanne Greenhouse, MD       DATE OF BIRTH:  1925/06/27  DATE OF PROCEDURE:  12/30/2011 DATE OF DISCHARGE:  12/30/2011                              OPERATIVE REPORT   PREOPERATIVE DIAGNOSIS:  Combined cataract, right eye.  POSTOPERATIVE DIAGNOSIS:  Combined cataract, right eye.  DIAGNOSIS CODE:  366.19.  OPERATION PERFORMED:  Phacoemulsification with posterior chamber intraocular lens implantation, right eye.  SURGEON:  Bonne Dolores. Siren Porrata, MD.  OPERATIVE SUMMARY:  In the preoperative area, dilating drops were placed into the right eye.  The patient was then brought into the operating room where he was placed under general anesthesia.  The eye was then prepped and draped.  Beginning with a 75 blade, a paracentesis port was made at the surgeon's 2 o'clock position.  The anterior chamber was then filled with a 1% nonpreserved lidocaine solution with epinephrine.  This was followed by Viscoat to deepen the chamber.  A small fornix-based peritomy was performed superiorly.  Next, a single iris hook was placed through the limbus superiorly.  A 2.4-mm keratome blade was then used to make a clear corneal incision over the iris hook.  A bent cystotome needle and Utrata forceps were used to create a continuous tear capsulotomy.  Hydrodissection was performed using balanced salt solution on a fine cannula.  The lens nucleus was then removed using phacoemulsification in a quadrant cracking technique.  The cortical material was then removed with irrigation and aspiration.  The capsular bag and anterior chamber were refilled with Provisc.  The wound was widened to approximately 3 mm and a posterior chamber intraocular lens was placed into the capsular bag without difficulty using an [employer] lens injecting system.  A single 10-0 nylon  suture was then used to close the incision as well as stromal hydration.  The Provisc was removed from the anterior chamber and capsular bag with irrigation and aspiration.  At this point, the wounds were tested for leak, which were negative.  The anterior chamber remained deep and stable.  The patient tolerated the procedure well.  There were no operative complications, and he awoke from general anesthesia without problem.  SURGICAL SPECIMENS:  None.  PROSTHETIC DEVICE USED:  A Lenstec posterior chamber lens, model Softec HD, power of 21, serial number [id number].          ______________________________ Susanne Greenhouse, MD     KEH/MEDQ  D:  12/30/2011  T:  12/30/2011  Job:  [id number]

## 2012-01-01 ENCOUNTER — Encounter (HOSPITAL_COMMUNITY): Payer: Self-pay | Admitting: Ophthalmology

## 2012-07-01 ENCOUNTER — Encounter (INDEPENDENT_AMBULATORY_CARE_PROVIDER_SITE_OTHER): Payer: Self-pay | Admitting: *Deleted

## 2012-07-28 ENCOUNTER — Other Ambulatory Visit (INDEPENDENT_AMBULATORY_CARE_PROVIDER_SITE_OTHER): Payer: Self-pay | Admitting: *Deleted

## 2012-07-28 ENCOUNTER — Encounter (INDEPENDENT_AMBULATORY_CARE_PROVIDER_SITE_OTHER): Payer: Self-pay | Admitting: *Deleted

## 2012-07-28 ENCOUNTER — Ambulatory Visit (INDEPENDENT_AMBULATORY_CARE_PROVIDER_SITE_OTHER): Payer: Medicare Other | Admitting: Internal Medicine

## 2012-07-28 ENCOUNTER — Encounter (INDEPENDENT_AMBULATORY_CARE_PROVIDER_SITE_OTHER): Payer: Self-pay | Admitting: Internal Medicine

## 2012-07-28 VITALS — BP 130/80 | HR 74 | Temp 95.9°F | Resp 18 | Ht 65.0 in | Wt 190.3 lb

## 2012-07-28 DIAGNOSIS — Z86010 Personal history of colonic polyps: Secondary | ICD-10-CM

## 2012-07-28 DIAGNOSIS — R109 Unspecified abdominal pain: Secondary | ICD-10-CM | POA: Insufficient documentation

## 2012-07-28 DIAGNOSIS — E039 Hypothyroidism, unspecified: Secondary | ICD-10-CM | POA: Insufficient documentation

## 2012-07-28 MED ORDER — DOCUSATE SODIUM 100 MG PO CAPS: 200.0000 mg | ORAL_CAPSULE | Freq: Every day | ORAL | Status: DC

## 2012-07-28 NOTE — Progress Notes (Signed)
PRESENTING COMPLAINT: History of colonic adenoma, intermittent  constipation, and left-sided abdominal pain.  HISTORY OF PRESENT ILLNESS: The patient is an 79 year old Caucasian  male who is referred through courtesy of Dr. Renard Matter for GI evaluation.  The patient is interested in colonoscopy. He has had 2 colonoscopies  previously and on each of these exams, he had tubular adenoma removed.  His last exam was in August 2006. Lately, he has been constipated. He  is trying to eat the fiber-rich foods to alleviate this problem. He  denies melena or rectal bleeding. He complains of left mid abdominal  pain. He states he has had this pain since he was 79 years old and  nobody has been able to tell him the cause of this. He describes as  fullness and aching pain and it generally is relieved with bowel  movements. His pain has never been intense or severe. He has good  appetite. His weight has been stable. He has sporadic and intermittent  heartburn with certain foods. He has occasional dysphagia which has  been chronic. He remains active. He exercises every day.   Current Medications:  1. Aspirin 81 mg p.o. daily.  2. Fish oil 1 g p.o. daily.  3. Levothyroxine 100 mcg p.o. daily.  PAST MEDICAL HISTORY:  1. Hypothyroidism.  2. History of osteoarthrosis. He had left knee replacement in  September 2012 and right one in January 2013.  3. He has had multiple basal cell carcinoma lesions removed, most  recently he had lesion removed in February 2013 from his forehead.  4. He had melanoma removed from his left shoulder years ago.  5. He had sounds like I and D for perirectal abscess in 1990s.  6. History of colonic adenomas as above.  7. He has had bilateral inguinal herniorrhaphy and he also has had  repair for right and left hydrocele at different times.  ALLERGIES:   PENICILLIN and SULFA.  FAMILY HISTORY: Noncontributory. Both parents are deceased. Mother  had breast cancer and died at age 44  and father lived to be 12. He has  2 brothers and 2 sisters. One brother has prostate CA. One sister and  4 brothers are deceased.  SOCIAL HISTORY: He is married. 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 He has 3 children in good health. He smoked  cigarettes for less than a pack a day for over 10 years, but quit in  1964. He does not drink alcohol.  PHYSICAL EXAMINATION:  VITAL SIGNS: Weight 190.3 pounds, he is 65 inches tall. Pulse 74 per minute, regular, blood pressure 130/80,  respirations 18, and temp is 95.9.  HEENT: Conjunctivae pink. Sclerae nonicteric. He has scar over mid  forehead extending to nasal bridge. Oropharyngeal mucosa is normal. He  has upper and lower dentures. No neck masses or thyromegaly noted.  CARDIAC: Regular rhythm. Normal S1 and S2. No murmur or gallop noted.  LUNGS: Clear to auscultation.  ABDOMEN: Full. Soft and nontender without organomegaly or masses.  RECTAL: Deferred. No peripheral edema or clubbing noted.  LABORATORY DATA: From May 22, 2012, WBC 4.7, H and H 13.9 and  39.7, platelet count 217,000. Electrolytes within normal limits.  Glucose 91, BUN 16, creatinine 0.95, calcium 9.3. Bilirubin 0.7, AP 64,  AST 15, ALT 10, albumin 3.9. TSH 1.6.  ASSESSMENT: The patient is an 79 year old Caucasian male who has  history of colonic adenomas who presents with intermittent constipation  and chronic left-sided abdominal  pain which seemed to alleviate with  defecation. Interestingly, he has had this pain since young age.  Suspect he may have pain due to irritable bowel syndrome. He, however,  is not a candidate for antispasmodic therapy given his age and potential  for side effects.  RECOMMENDATIONS:  1. Continue high-fiber diet.  2. Colace 200 mg p.o. q.h.s.  3. Diagnostic/surveillance colonoscopy to be performed at [facility] in near  future. I have reviewed the procedure risks with the patient. He  is agreeable.  4. We will give  him single dose of Levaquin for antimicrobial  prophylaxis.  5. We appreciate the opportunity to participate in the care of this  gentleman.

## 2012-07-28 NOTE — Patient Instructions (Signed)
Colonoscopy to be scheduled. 

## 2012-07-28 NOTE — Telephone Encounter (Signed)
This encounter was created in error - please disregard.

## 2012-07-29 ENCOUNTER — Encounter (HOSPITAL_COMMUNITY): Payer: Self-pay | Admitting: Pharmacy Technician

## 2012-07-29 NOTE — Consult Note (Signed)
NAME:  Evan Miller, Evan Miller                  ACCOUNT NO.:  MEDICAL RECORD NO.:  [id number]  LOCATION:                                 FACILITY:  PHYSICIAN:  Lionel December, M.D.    DATE OF BIRTH:  1926/04/21  DATE OF CONSULTATION:  07/28/2012 DATE OF DISCHARGE:                                CONSULTATION   PRESENTING COMPLAINT:  History of colonic adenoma, intermittent constipation, and left-sided abdominal pain.  HISTORY OF PRESENT ILLNESS:  The patient is an 79 year old Caucasian male who is referred through courtesy of Dr. Renard Matter for GI evaluation. The patient is interested in colonoscopy.  He has had 2 colonoscopies previously and on each of these exams, he had tubular adenoma removed. His last exam was in August 2006.  Lately, he has been constipated.  He is trying to eat the fiber-rich foods to alleviate this problem.  He denies melena or rectal bleeding.  He complains of left mid abdominal pain.  He states he has had this pain since he was 79 years old and nobody has been able to tell him the cause of this.  He describes as fullness and aching pain and it generally is relieved with bowel movements.  His pain has never been intense or severe.  He has good appetite.  His weight has been stable.  He has sporadic and intermittent heartburn with certain foods.  He has occasional dysphagia which has been chronic.  He remains active.  He exercises every day.  MEDICATIONS:  Current Medications: 1. Aspirin 81 mg p.o. daily. 2. Fish oil 1 g p.o. daily. 3. Levothyroxine 100 mcg p.o. daily.  PAST MEDICAL HISTORY: 1. Hypothyroidism. 2. History of osteoarthrosis.  He had left knee replacement in     September 2012 and right one in January 2013. 3. He has had multiple basal cell carcinoma lesions removed, most     recently he had lesion removed in February 2013 from his forehead. 4. He had melanoma removed from his left shoulder years ago. 5. He had sounds like I and D for perirectal  abscess in 1990s. 6. History of colonic adenomas as above. 7. He has had bilateral inguinal herniorrhaphy and he also has had     repair for right and left hydrocele at different times.  ALLERGIES:  To PENICILLIN and SULFA.  FAMILY HISTORY:  Noncontributory.  Both parents are deceased.  Mother had breast cancer and died at age 30 and father lived to be 4.  He has 2 brothers and 2 sisters.  One brother has prostate CA.  One sister and 4 brothers are deceased.  SOCIAL HISTORY:  He is married.  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  He has 3 children in good health.  He smoked cigarettes for less than a pack a day for over 10 years, but quit in 1964.  He does not drink alcohol.  PHYSICAL EXAMINATION:  VITAL SIGNS:  Weight 190.3 pounds,  he is 65 inches tall.  Pulse 74 per minute, regular, blood pressure 130/80, respirations 18, and temp is 95.9. HEENT:  Conjunctivae pink.  Sclerae nonicteric.  He has scar  over mid forehead extending to nasal bridge.  Oropharyngeal mucosa is normal.  He has upper and lower dentures.  No neck masses or thyromegaly noted. CARDIAC:  Regular rhythm.  Normal S1 and S2.  No murmur or gallop noted. LUNGS:  Clear to auscultation. ABDOMEN:  Full.  Soft and nontender without organomegaly or masses. RECTAL:  Deferred.  No peripheral edema or clubbing noted.  LABORATORY DATA:  From May 22, 2012, WBC 4.7, H and H 13.9 and 39.7, platelet count 217,000.  Electrolytes within normal limits. Glucose 91, BUN 16, creatinine 0.95, calcium 9.3.  Bilirubin 0.7, AP 64, AST 15, ALT 10, albumin 3.9.  TSH 1.6.  ASSESSMENT:  The patient is an 79 year old Caucasian male who has history of colonic adenomas who presents with intermittent constipation and chronic left-sided abdominal pain which seemed to alleviate with defecation.  Interestingly, he has had this pain since young age. Suspect he may have pain due to irritable bowel syndrome.  He,  however, is not a candidate for antispasmodic therapy given his age and potential for side effects.  RECOMMENDATIONS: 1. Continue high-fiber diet. 2. Colace 200 mg p.o. q.h.s. 3. Diagnostic/surveillance colonoscopy to be performed at [facility] in near     future.  I have reviewed the procedure risks with the patient.  He     is agreeable. 4. We will give him single dose of Levaquin for antimicrobial     prophylaxis. 5. We appreciate the opportunity to participate in the care of this     gentleman.          ______________________________ Lionel December, M.D.     NR/MEDQ  D:  07/28/2012  T:  07/29/2012  Job:  [id number]  cc:   Angus G. Renard Matter, MD Fax: [phone]

## 2012-08-06 MED ORDER — SODIUM CHLORIDE 0.45 % IV SOLN
INTRAVENOUS | Status: DC
  Administered 2012-08-07: 1000 mL via INTRAVENOUS

## 2012-08-07 ENCOUNTER — Encounter (HOSPITAL_COMMUNITY): Payer: Self-pay | Admitting: *Deleted

## 2012-08-07 ENCOUNTER — Ambulatory Visit (HOSPITAL_COMMUNITY)
Admission: RE | Admit: 2012-08-07 | Discharge: 2012-08-07 | Disposition: A | Discharge status: Home / Self Care | Payer: Medicare Other | Source: Ambulatory Visit | Attending: Internal Medicine | Admitting: Internal Medicine

## 2012-08-07 ENCOUNTER — Encounter (HOSPITAL_COMMUNITY)
Admission: RE | Disposition: A | Discharge status: Home / Self Care | Payer: Self-pay | Source: Ambulatory Visit | Attending: Internal Medicine

## 2012-08-07 DIAGNOSIS — K5730 Diverticulosis of large intestine without perforation or abscess without bleeding: Secondary | ICD-10-CM

## 2012-08-07 DIAGNOSIS — R109 Unspecified abdominal pain: Secondary | ICD-10-CM

## 2012-08-07 DIAGNOSIS — D126 Benign neoplasm of colon, unspecified: Secondary | ICD-10-CM

## 2012-08-07 DIAGNOSIS — K5900 Constipation, unspecified: Secondary | ICD-10-CM

## 2012-08-07 DIAGNOSIS — Z860100 Personal history of colon polyps, unspecified: Secondary | ICD-10-CM | POA: Insufficient documentation

## 2012-08-07 DIAGNOSIS — K644 Residual hemorrhoidal skin tags: Secondary | ICD-10-CM | POA: Insufficient documentation

## 2012-08-07 DIAGNOSIS — R1032 Left lower quadrant pain: Secondary | ICD-10-CM | POA: Insufficient documentation

## 2012-08-07 HISTORY — PX: COLONOSCOPY: SHX5424

## 2012-08-07 SURGERY — COLONOSCOPY
Anesthesia: Moderate Sedation

## 2012-08-07 MED ORDER — MEPERIDINE HCL 50 MG/ML IJ SOLN
INTRAMUSCULAR | Status: DC | PRN
  Administered 2012-08-07: 20 mg via INTRAVENOUS

## 2012-08-07 MED ORDER — MIDAZOLAM HCL 5 MG/5ML IJ SOLN
INTRAMUSCULAR | Status: AC
  Filled 2012-08-07: qty 10

## 2012-08-07 MED ORDER — MEPERIDINE HCL 50 MG/ML IJ SOLN
INTRAMUSCULAR | Status: AC
  Filled 2012-08-07: qty 1

## 2012-08-07 MED ORDER — MIDAZOLAM HCL 5 MG/5ML IJ SOLN
INTRAMUSCULAR | Status: DC | PRN
  Administered 2012-08-07: 2 mg via INTRAVENOUS

## 2012-08-07 MED ORDER — STERILE WATER FOR IRRIGATION IR SOLN
Status: DC | PRN
  Administered 2012-08-07: 10:00:00

## 2012-08-07 NOTE — H&P (Signed)
Evan Miller is an 79 y.o. male.   Chief Complaint: Patient is here for colonoscopy. HPI: Patient is a 79 year-old Caucasian male who has history of colonic adenoma who presents with intermittent/chronic left-sided abdominal pain and constipation. He has good appetite. He denies rectal bleeding. His last colonoscopy was in August 2006. Family history is negative for colorectal carcinoma.  Past Medical History  Diagnosis Date  . PONV (postoperative nausea and vomiting)   . Hypothyroidism   . Hepatitis     age 9 years  . GERD (gastroesophageal reflux disease)   . Arthritis     back,shoulders and hips  . Cancer     basal cell/ Melanoma 1997 left  shoulder/  PROSTATE    Past Surgical History  Procedure Laterality Date  . Joint replacement      left knee 9/12  . Hernia repair    . Hydrocele excision      bilateral  . Colonoscopy    . Total knee arthroplasty  06/24/2011    Procedure: TOTAL KNEE ARTHROPLASTY;  Surgeon: Loanne Drilling;  Location: [facility];  Service: Orthopedics;  Laterality: Right;  . Cataract extraction w/phaco  12/16/2011    Procedure: CATARACT EXTRACTION PHACO AND INTRAOCULAR LENS PLACEMENT (IOC);  Surgeon: Gemma Payor, MD;  Location: [facility];  Service: Ophthalmology;  Laterality: Left;  CDE:17.24   . Cataract extraction w/phaco  12/30/2011    Procedure: CATARACT EXTRACTION PHACO AND INTRAOCULAR LENS PLACEMENT (IOC);  Surgeon: Gemma Payor, MD;  Location: [facility];  Service: Ophthalmology;  Laterality: Right;  CDE 15.40    Family History  Problem Relation Age of Onset  . Breast cancer Mother   . Prostate cancer Brother   . Nephritis Brother   . Rectal cancer Brother   . Cirrhosis Brother   . Liver cancer Brother   . Healthy Daughter   . Healthy Son    Social History:  reports that he quit smoking about 50 years ago. His smoking use included Cigarettes. He has a 30 pack-year smoking history. He quit smokeless tobacco use about 6 years ago. His smokeless tobacco use  included Chew. He reports that he does not drink alcohol or use illicit drugs.  Allergies:  Allergies  Allergen Reactions  . Penicillins Other (See Comments)    Reaction: large knots on body   . Sulfa Antibiotics Hives    Medications Prior to Admission  Medication Sig Dispense Refill  . aspirin EC 81 MG tablet Take 81 mg by mouth every morning.      . docusate sodium (COLACE) 100 MG capsule Take 2 capsules (200 mg total) by mouth at bedtime.  10 capsule  0  . fish oil-omega-3 fatty acids 1000 MG capsule Take 1 g by mouth every morning.       Marland Kitchen levothyroxine (SYNTHROID, LEVOTHROID) 100 MCG tablet Take 100 mcg by mouth daily before breakfast.         No results found for this or any previous visit (from the past 48 hour(s)). No results found.  ROS  Blood pressure 133/79, pulse 64, temperature 97.6 F (36.4 C), temperature source Oral, resp. rate 21, height 5\' 5"  (1.651 m), weight 190 lb (86.183 kg), SpO2 97.00%. Physical Exam  Constitutional: He appears well-developed and well-nourished.  HENT:  Mouth/Throat: Oropharynx is clear and moist.  Eyes: Conjunctivae are normal. No scleral icterus.  Neck: No thyromegaly present.  Cardiovascular: Normal rate, regular rhythm and normal heart sounds.   No murmur heard. Respiratory: Effort  normal and breath sounds normal.  GI: Soft. He exhibits no distension and no mass. There is no tenderness.  Musculoskeletal: He exhibits no edema.  Lymphadenopathy:    He has no cervical adenopathy.  Neurological: He is alert.  Skin: Skin is warm and dry.     Assessment/Plan History of colonic adenoma. Constipation and left-sided abdominal pain. Diagnostic/surveillance colonoscopy.  REHMAN,NAJEEB U 08/07/2012, 9:54 AM

## 2012-08-07 NOTE — Op Note (Signed)
COLONOSCOPY PROCEDURE REPORT  PATIENT:  Evan Miller  MR#:  [id number] Birthdate:  17-Mar-1926, 79 y.o., male Endoscopist:  Dr. Malissa Hippo, MD Referred By:  Dr. Alice Reichert, MD Procedure Date: 08/07/2012  Procedure:   Colonoscopy with snare polypectomy, APC therapy and Hemoclip application.  Indications:  Patient is 79 year old Caucasian male with history of colonic adenomas who presents with constipation and intermittent left-sided abdominal pain.  Informed Consent:  The procedure and risks were reviewed with the patient and informed consent was obtained.  Medications:  Demerol 20 mg IV Versed 2 mg IV  Description of procedure:  After a digital rectal exam was performed, that colonoscope was advanced from the anus through the rectum and colon to the area of the cecum, ileocecal valve and appendiceal orifice. The cecum was deeply intubated. These structures were well-seen and photographed for the record. From the level of the cecum and ileocecal valve, the scope was slowly and cautiously withdrawn. The mucosal surfaces were carefully surveyed utilizing scope tip to flexion to facilitate fold flattening as needed. The scope was pulled down into the rectum where a thorough exam including retroflexion was performed.  Findings:   Prep excellent. 2 cm long sessile polyp located next to ileocecal valve. Piecemeal polypectomy performed. Proximal margin of this polyp difficult to see and was ablated with APC. Oozing from middle of polypectomy site controlled with application of 2 hemoclips. Polypectomy felt to be complete. 2 small polyps snared from ascending colon and submitted together. 5 small polyps were snared from transverse colon and submitted together(3 were hot snared and tube cold snared). Scattered diverticula at sigmoid colon. Normal rectal mucosa. Small hemorrhoids below the dentate line.  Therapeutic/Diagnostic Maneuvers Performed:  See above  Complications:   None  Cecal Withdrawal Time:  43 minutes  Impression:  Examination performed to cecum. Large sessile polyp at cecum snared piecemeal. Part of the polyp was ablated with APC and two hemoclips applied for hemostasis. Two small polyps snared from ascending colon and submitted together. Five small polyps snared from transverse colon and submitted together. Moderate sigmoid colon diverticulosis. Small external hemorrhoids.  Recommendations:  Standard instructions given. No aspirin or NSAIDs for one week. Patient informed that he cannot have MRI until hemoclips have passed. I will contact patient with biopsy results and further recommendations.  Kairi Harshbarger U  08/07/2012 11:18 AM  CC: Dr. Alice Reichert, MD & Dr. Bonnetta Barry ref. provider found

## 2012-08-10 ENCOUNTER — Encounter (HOSPITAL_COMMUNITY): Payer: Self-pay | Admitting: Internal Medicine

## 2012-08-12 ENCOUNTER — Encounter (INDEPENDENT_AMBULATORY_CARE_PROVIDER_SITE_OTHER): Payer: Self-pay | Admitting: *Deleted

## 2012-09-08 ENCOUNTER — Encounter (INDEPENDENT_AMBULATORY_CARE_PROVIDER_SITE_OTHER): Payer: Self-pay

## 2014-12-27 ENCOUNTER — Other Ambulatory Visit (HOSPITAL_COMMUNITY): Payer: Self-pay | Admitting: Urology

## 2014-12-27 DIAGNOSIS — C61 Malignant neoplasm of prostate: Secondary | ICD-10-CM

## 2015-01-09 ENCOUNTER — Encounter (HOSPITAL_COMMUNITY)
Admission: RE | Admit: 2015-01-09 | Discharge: 2015-01-09 | Disposition: A | Discharge status: Home / Self Care | Payer: Medicare Other | Source: Ambulatory Visit | Attending: Urology | Admitting: Urology

## 2015-01-09 ENCOUNTER — Ambulatory Visit (HOSPITAL_COMMUNITY)
Admission: RE | Admit: 2015-01-09 | Discharge: 2015-01-09 | Disposition: A | Discharge status: Home / Self Care | Payer: Medicare Other | Source: Ambulatory Visit | Attending: Urology | Admitting: Urology

## 2015-01-09 DIAGNOSIS — Z96659 Presence of unspecified artificial knee joint: Secondary | ICD-10-CM | POA: Diagnosis not present

## 2015-01-09 DIAGNOSIS — C61 Malignant neoplasm of prostate: Secondary | ICD-10-CM | POA: Insufficient documentation

## 2015-01-09 MED ORDER — TECHNETIUM TC 99M MEDRONATE IV KIT
27.3000 | PACK | Freq: Once | INTRAVENOUS | Status: AC | PRN
  Administered 2015-01-09: 27.3 via INTRAVENOUS

## 2015-01-18 ENCOUNTER — Encounter (INDEPENDENT_AMBULATORY_CARE_PROVIDER_SITE_OTHER): Payer: Self-pay | Admitting: *Deleted

## 2015-01-25 ENCOUNTER — Encounter (INDEPENDENT_AMBULATORY_CARE_PROVIDER_SITE_OTHER): Payer: Self-pay | Admitting: Internal Medicine

## 2015-01-25 ENCOUNTER — Other Ambulatory Visit (INDEPENDENT_AMBULATORY_CARE_PROVIDER_SITE_OTHER): Payer: Self-pay | Admitting: *Deleted

## 2015-01-25 ENCOUNTER — Ambulatory Visit (INDEPENDENT_AMBULATORY_CARE_PROVIDER_SITE_OTHER): Payer: Medicare Other | Admitting: Internal Medicine

## 2015-01-25 ENCOUNTER — Encounter (INDEPENDENT_AMBULATORY_CARE_PROVIDER_SITE_OTHER): Payer: Self-pay | Admitting: *Deleted

## 2015-01-25 VITALS — BP 96/64 | HR 64 | Temp 97.6°F | Ht 64.0 in | Wt 178.0 lb

## 2015-01-25 DIAGNOSIS — R1314 Dysphagia, pharyngoesophageal phase: Secondary | ICD-10-CM

## 2015-01-25 DIAGNOSIS — R1310 Dysphagia, unspecified: Secondary | ICD-10-CM

## 2015-01-25 NOTE — Progress Notes (Signed)
Subjective:    Patient ID: Evan Miller, male    DOB: 12/26/1925, 81 y.o.   MRN: [id number]  HPI Referred to our office by Dr. Everette Rank for dysphagia. Symptoms for about a year. Progressively worsened. Last episode of dysphagia 2 weeks ago after eating [place].   He says he is trying to be careful what he eats. Chicken also will lodge. Denies prior hx of EGD/ED.  Appetite is good. No weight loss per patient. No abdominal pain. Usually has a BM daily. No melena or BRRB.  Last colonoscopy was in 2014. Next colonoscopy in 3 yrs if patient remains in good health.  Takes ASA daily. Hx of bilateral knee replacements 2012 and 2013.        Procedure:08/07/2012:  Colonoscopy with snare polypectomy, APC therapy and Hemoclip application.  Indications: Patient is 81 year old Caucasian male with history of colonic adenomas who presents with constipation and intermittent left-sided abdominal pain.  Impression:  Examination performed to cecum. Large sessile polyp at cecum snared piecemeal. Part of the polyp was ablated with APC and two hemoclips applied for hemostasis. Two small polyps snared from ascending colon and submitted together. Five small polyps snared from transverse colon and submitted together. Moderate sigmoid colon diverticulosis. Small external hemorrhoids.  All polyps are tubular adenomas including large cecal polyp. Results given to patient. Next colonoscopy in 3 years as long he remains in good health. Report to PCP Review of Systems Past Medical History  Diagnosis Date  . PONV (postoperative nausea and vomiting)   . Hypothyroidism   . Hepatitis     age 46 years  . GERD (gastroesophageal reflux disease)   . Arthritis     back,shoulders and hips  . Cancer     basal cell/ Melanoma 1997 left  shoulder/  PROSTATE    Past Surgical History  Procedure Laterality Date  . Joint replacement      left knee 9/12  . Hernia repair    . Hydrocele excision      bilateral   . Colonoscopy    . Total knee arthroplasty  06/24/2011    Procedure: TOTAL KNEE ARTHROPLASTY;  Surgeon: Gearlean Alf;  Location: [facility];  Service: Orthopedics;  Laterality: Right;  . Cataract extraction w/phaco  12/16/2011    Procedure: CATARACT EXTRACTION PHACO AND INTRAOCULAR LENS PLACEMENT (IOC);  Surgeon: Tonny Branch, MD;  Location: [facility];  Service: Ophthalmology;  Laterality: Left;  CDE:17.24   . Cataract extraction w/phaco  12/30/2011    Procedure: CATARACT EXTRACTION PHACO AND INTRAOCULAR LENS PLACEMENT (IOC);  Surgeon: Tonny Branch, MD;  Location: [facility];  Service: Ophthalmology;  Laterality: Right;  CDE 15.40  . Colonoscopy N/A 08/07/2012    Procedure: COLONOSCOPY;  Surgeon: Rogene Houston, MD;  Location: [facility];  Service: Endoscopy;  Laterality: N/A;  10:00    Allergies  Allergen Reactions  . Penicillins Other (See Comments)    Reaction: large knots on body   . Sulfa Antibiotics Hives    Current Outpatient Prescriptions on File Prior to Visit  Medication Sig Dispense Refill  . levothyroxine (SYNTHROID, LEVOTHROID) 100 MCG tablet Take 100 mcg by mouth daily before breakfast.      No current facility-administered medications on file prior to visit.        Objective:   Physical Exam Blood pressure 96/64, pulse 64, temperature 97.6 F (36.4 C), height 5\' 4"  (1.626 m), weight 178 lb (80.74 kg).  Alert and oriented. Skin warm and dry. Oral mucosa  is moist.  Upper and lower dentures . Sclera anicteric, conjunctivae is pink. Thyroid not enlarged. No cervical lymphadenopathy. Lungs clear. Heart regular rate and rhythm.  Abdomen is soft. Bowel sounds are positive. No hepatomegaly. No abdominal masses felt. No tenderness.  No edema to lower extremities.       Assessment & Plan:  Solid food dysphagia. Esophageal stricture needs to be ruled out.  EGD/ED. The risks and benefits such as perforation, bleeding, and infection were reviewed with the patient and is agreeable.

## 2015-01-25 NOTE — Patient Instructions (Addendum)
EGD/ED. The risks and benefits such as perforation, bleeding, and infection were reviewed with the patient and is agreeable. Chew food well. Avoid chicken, breads, steak.

## 2015-02-10 ENCOUNTER — Ambulatory Visit (HOSPITAL_COMMUNITY)
Admission: RE | Admit: 2015-02-10 | Discharge: 2015-02-10 | Disposition: A | Discharge status: Home / Self Care | Payer: Medicare Other | Source: Ambulatory Visit | Attending: Internal Medicine | Admitting: Internal Medicine

## 2015-02-10 ENCOUNTER — Encounter (HOSPITAL_COMMUNITY): Payer: Self-pay

## 2015-02-10 ENCOUNTER — Encounter (HOSPITAL_COMMUNITY)
Admission: RE | Disposition: A | Discharge status: Home / Self Care | Payer: Self-pay | Source: Ambulatory Visit | Attending: Internal Medicine

## 2015-02-10 DIAGNOSIS — M1389 Other specified arthritis, multiple sites: Secondary | ICD-10-CM | POA: Insufficient documentation

## 2015-02-10 DIAGNOSIS — Z9841 Cataract extraction status, right eye: Secondary | ICD-10-CM | POA: Insufficient documentation

## 2015-02-10 DIAGNOSIS — R1310 Dysphagia, unspecified: Secondary | ICD-10-CM

## 2015-02-10 DIAGNOSIS — E039 Hypothyroidism, unspecified: Secondary | ICD-10-CM | POA: Insufficient documentation

## 2015-02-10 DIAGNOSIS — Z7982 Long term (current) use of aspirin: Secondary | ICD-10-CM | POA: Insufficient documentation

## 2015-02-10 DIAGNOSIS — Z85820 Personal history of malignant melanoma of skin: Secondary | ICD-10-CM | POA: Insufficient documentation

## 2015-02-10 DIAGNOSIS — Z9842 Cataract extraction status, left eye: Secondary | ICD-10-CM | POA: Diagnosis not present

## 2015-02-10 DIAGNOSIS — Z87891 Personal history of nicotine dependence: Secondary | ICD-10-CM | POA: Insufficient documentation

## 2015-02-10 DIAGNOSIS — Z882 Allergy status to sulfonamides status: Secondary | ICD-10-CM | POA: Diagnosis not present

## 2015-02-10 DIAGNOSIS — K219 Gastro-esophageal reflux disease without esophagitis: Secondary | ICD-10-CM | POA: Insufficient documentation

## 2015-02-10 DIAGNOSIS — K449 Diaphragmatic hernia without obstruction or gangrene: Secondary | ICD-10-CM | POA: Diagnosis not present

## 2015-02-10 DIAGNOSIS — Z961 Presence of intraocular lens: Secondary | ICD-10-CM | POA: Diagnosis not present

## 2015-02-10 DIAGNOSIS — Z880 Allergy status to penicillin: Secondary | ICD-10-CM | POA: Diagnosis not present

## 2015-02-10 DIAGNOSIS — K222 Esophageal obstruction: Secondary | ICD-10-CM | POA: Insufficient documentation

## 2015-02-10 DIAGNOSIS — K210 Gastro-esophageal reflux disease with esophagitis: Secondary | ICD-10-CM | POA: Diagnosis not present

## 2015-02-10 DIAGNOSIS — Z96652 Presence of left artificial knee joint: Secondary | ICD-10-CM | POA: Diagnosis not present

## 2015-02-10 HISTORY — PX: ESOPHAGOGASTRODUODENOSCOPY: SHX5428

## 2015-02-10 HISTORY — PX: ESOPHAGEAL DILATION: SHX303

## 2015-02-10 SURGERY — EGD (ESOPHAGOGASTRODUODENOSCOPY)
Anesthesia: Moderate Sedation

## 2015-02-10 MED ORDER — PANTOPRAZOLE SODIUM 40 MG PO TBEC: 40.0000 mg | DELAYED_RELEASE_TABLET | Freq: Every day | ORAL | Status: DC

## 2015-02-10 MED ORDER — MEPERIDINE HCL 50 MG/ML IJ SOLN
INTRAMUSCULAR | Status: DC | PRN
  Administered 2015-02-10: 25 mg via INTRAVENOUS

## 2015-02-10 MED ORDER — MEPERIDINE HCL 50 MG/ML IJ SOLN
INTRAMUSCULAR | Status: AC
  Filled 2015-02-10: qty 1

## 2015-02-10 MED ORDER — MIDAZOLAM HCL 5 MG/5ML IJ SOLN
INTRAMUSCULAR | Status: AC
  Filled 2015-02-10: qty 10

## 2015-02-10 MED ORDER — BUTAMBEN-TETRACAINE-BENZOCAINE 2-2-14 % EX AERO
INHALATION_SPRAY | CUTANEOUS | Status: DC | PRN
  Administered 2015-02-10: 1 via TOPICAL

## 2015-02-10 MED ORDER — SODIUM CHLORIDE 0.9 % IV SOLN
INTRAVENOUS | Status: DC
  Administered 2015-02-10: 09:00:00 via INTRAVENOUS

## 2015-02-10 MED ORDER — MIDAZOLAM HCL 5 MG/5ML IJ SOLN
INTRAMUSCULAR | Status: DC | PRN
  Administered 2015-02-10: 1 mg via INTRAVENOUS

## 2015-02-10 MED ORDER — STERILE WATER FOR IRRIGATION IR SOLN
Status: DC | PRN
  Administered 2015-02-10: 10:00:00

## 2015-02-10 MED ORDER — BUTAMBEN-TETRACAINE-BENZOCAINE 2-2-14 % EX AERO
INHALATION_SPRAY | CUTANEOUS | Status: AC
  Filled 2015-02-10: qty 20

## 2015-02-10 NOTE — H&P (Signed)
Evan Miller is an 81 y.o. male.   Chief Complaint: Patient's here for EGD and ED. HPI: Patient is 81 year old Caucasian male who presents with one-year history of intermittent solid food dysphagia. He points to suprasternal area soft bolus obstruction. He denies nausea vomiting weight loss or melena. He states he used to get heartburn often but not anymore.  Past Medical History  Diagnosis Date  . PONV (postoperative nausea and vomiting)   . Hypothyroidism   . Hepatitis     age 43 years  . GERD (gastroesophageal reflux disease)   . Arthritis     back,shoulders and hips  . Cancer     basal cell/ Melanoma 1997 left  shoulder/  PROSTATE    Past Surgical History  Procedure Laterality Date  . Joint replacement      left knee 9/12  . Hernia repair    . Hydrocele excision      bilateral  . Colonoscopy    . Total knee arthroplasty  06/24/2011    Procedure: TOTAL KNEE ARTHROPLASTY;  Surgeon: Gearlean Alf;  Location: [facility];  Service: Orthopedics;  Laterality: Right;  . Cataract extraction w/phaco  12/16/2011    Procedure: CATARACT EXTRACTION PHACO AND INTRAOCULAR LENS PLACEMENT (IOC);  Surgeon: Tonny Branch, MD;  Location: [facility];  Service: Ophthalmology;  Laterality: Left;  CDE:17.24   . Cataract extraction w/phaco  12/30/2011    Procedure: CATARACT EXTRACTION PHACO AND INTRAOCULAR LENS PLACEMENT (IOC);  Surgeon: Tonny Branch, MD;  Location: [facility];  Service: Ophthalmology;  Laterality: Right;  CDE 15.40  . Colonoscopy N/A 08/07/2012    Procedure: COLONOSCOPY;  Surgeon: Rogene Houston, MD;  Location: [facility];  Service: Endoscopy;  Laterality: N/A;  10:00    Family History  Problem Relation Age of Onset  . Breast cancer Mother   . Prostate cancer Brother   . Nephritis Brother   . Rectal cancer Brother   . Cirrhosis Brother   . Liver cancer Brother   . Healthy Daughter   . Healthy Son    Social History:  reports that he quit smoking about 52 years ago. His smoking use  included Cigarettes. He has a 30 pack-year smoking history. He quit smokeless tobacco use about 8 years ago. His smokeless tobacco use included Chew. He reports that he does not drink alcohol or use illicit drugs.  Allergies:  Allergies  Allergen Reactions  . Penicillins Other (See Comments)    Reaction: large knots on body   . Sulfa Antibiotics Hives    Medications Prior to Admission  Medication Sig Dispense Refill  . levothyroxine (SYNTHROID, LEVOTHROID) 100 MCG tablet Take 100 mcg by mouth daily before breakfast.     . aspirin 81 MG tablet Take 81 mg by mouth daily.      No results found for this or any previous visit (from the past 48 hour(s)). No results found.  ROS  Blood pressure 120/72, pulse 61, temperature 97.7 F (36.5 C), temperature source Oral, resp. rate 16, height 5\' 4"  (1.626 m), weight 178 lb (80.74 kg), SpO2 95 %. Physical Exam  Constitutional: He appears well-developed and well-nourished.  HENT:  Mouth/Throat: Oropharynx is clear and moist.  Patient has complete upper and lower dentures.  Eyes: Conjunctivae are normal. No scleral icterus.  Neck: No thyromegaly present.  Cardiovascular: Normal rate, regular rhythm and normal heart sounds.   No murmur heard. Respiratory: Effort normal and breath sounds normal.  GI: Soft. He exhibits no distension and  no mass. There is no tenderness.  Musculoskeletal: He exhibits no edema.  Lymphadenopathy:    He has no cervical adenopathy.  Neurological: He is alert.  Skin: Skin is warm and dry.     Assessment/Plan Solid food dysphagia. EGD with ED.  Evan Miller U 02/10/2015, 10:16 AM

## 2015-02-10 NOTE — Discharge Instructions (Signed)
Resume usual medications and diet. Pantoprazole 40 mg by mouth 30 minutes before breakfast daily. No driving for 24 hours. Repeat dilation in 6-8 weeks.    Esophagogastroduodenoscopy Care After Refer to this sheet in the next few weeks. These instructions provide you with information on caring for yourself after your procedure. Your caregiver may also give you more specific instructions. Your treatment has been planned according to current medical practices, but problems sometimes occur. Call your caregiver if you have any problems or questions after your procedure.  HOME CARE INSTRUCTIONS  Do not eat or drink anything until the numbing medicine (local anesthetic) has worn off and your gag reflex has returned. You will know that the local anesthetic has worn off when you can swallow comfortably.  Do not drive for 12 hours after the procedure or as directed by your caregiver.  Only take medicines as directed by your caregiver. SEEK MEDICAL CARE IF:   You cannot stop coughing.  You are not urinating at all or less than usual. SEEK IMMEDIATE MEDICAL CARE IF:  You have difficulty swallowing.  You cannot eat or drink.  You have worsening throat or chest pain.  You have dizziness, lightheadedness, or you faint.  You have nausea or vomiting.  You have chills.  You have a fever.  You have severe abdominal pain.  You have black, tarry, or bloody stools. Document Released: 05/13/2012 Document Reviewed: 05/13/2012 [facility] Patient Information 2015 [place]. This information is not intended to replace advice given to you by your health care provider. Make sure you discuss any questions you have with your health care provider.

## 2015-02-10 NOTE — Op Note (Addendum)
EGD PROCEDURE REPORT  PATIENT:  Evan Miller  MR#:  [id number] Birthdate:  05-26-1926, 81 y.o., male Endoscopist:  Dr. Rogene Houston, MD Referred By:  Dr. Lanette Hampshire, MD  Procedure Date: 02/10/2015  Procedure:   EGD with ED  Indications:  Patient is 81 year old Caucasian male who presents with one-year history of intermittent solid food dysphagia. He denies nausea vomiting melena or weight loss. He states he used to have heartburn frequently but not anymore.            Informed Consent:  The risks, benefits, alternatives & imponderables which include, but are not limited to, bleeding, infection, perforation, drug reaction and potential missed lesion have been reviewed.  The potential for biopsy, lesion removal, esophageal dilation, etc. have also been discussed.  Questions have been answered.  All parties agreeable.  Please see history & physical in medical record for more information.  Medications:  Demerol 25 mg IV Versed 1 mg IV Cetacaine spray topically for oropharyngeal anesthesia  Description of procedure:  The endoscope was introduced through the mouth and advanced to the second portion of the duodenum without difficulty or limitations. The mucosal surfaces were surveyed very carefully during advancement of the scope and upon withdrawal.  Findings:  Esophagus:  Mucosa of the proximal and middle third was normal. Focal and linear erosions noted in distal 3-4 cm along with any ulcer at distal esophagus extending to GE junction. Stricture noted at GE junction. I can see the mucosa on the other side but could not pass the scope through it. This stricture was dilated with balloon to 15 mm and exam completed. GEJ:  34 cm Hiatus:  38 cm Stomach:  Stomach was empty and distended very well with insufflation. Folds in the proximal stomach were normal. Examination of mucosa at gastric body, antrum, pyloric channel, angularis fundus and cardia was normal. Hernia was easily seen on this  view. Duodenum:  Normal bulbar and post bulbar mucosa.  Therapeutic/Diagnostic Maneuvers Performed:   Stricture at GE junction was initially dilated with a balloon dilator to 15 mm. Scope was advanced into the stomach and examination completed. Guidewire was pushed into gastric lumen. This stricture was then dilated with the same balloon to 16.5 mm. Balloon was deflated and withdrawn and endoscope was also withdrawn.   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 This stricture was dilated to 16.5 mm with the balloon dilator. Small sliding hiatal hernia.  Recommendations:  Anti-reflux measures. Pantoprazole 40 mg by mouth every morning. Repeat dilation in 6-8 weeks.  REHMAN,NAJEEB U  02/10/2015  10:49 AM  CC: Dr. Lanette Hampshire, MD & Dr. Rayne Du ref. provider found

## 2015-02-14 ENCOUNTER — Encounter (INDEPENDENT_AMBULATORY_CARE_PROVIDER_SITE_OTHER): Payer: Self-pay | Admitting: *Deleted

## 2015-02-14 ENCOUNTER — Other Ambulatory Visit (INDEPENDENT_AMBULATORY_CARE_PROVIDER_SITE_OTHER): Payer: Self-pay | Admitting: *Deleted

## 2015-02-14 DIAGNOSIS — K222 Esophageal obstruction: Secondary | ICD-10-CM

## 2015-02-14 DIAGNOSIS — R1310 Dysphagia, unspecified: Secondary | ICD-10-CM

## 2015-02-15 ENCOUNTER — Encounter (HOSPITAL_COMMUNITY): Payer: Self-pay | Admitting: Internal Medicine

## 2015-03-29 ENCOUNTER — Encounter (HOSPITAL_COMMUNITY)
Admission: RE | Disposition: A | Discharge status: Home / Self Care | Payer: Self-pay | Source: Ambulatory Visit | Attending: Internal Medicine

## 2015-03-29 ENCOUNTER — Encounter (HOSPITAL_COMMUNITY): Payer: Self-pay | Admitting: *Deleted

## 2015-03-29 ENCOUNTER — Ambulatory Visit (HOSPITAL_COMMUNITY)
Admission: RE | Admit: 2015-03-29 | Discharge: 2015-03-29 | Disposition: A | Discharge status: Home / Self Care | Payer: Medicare Other | Source: Ambulatory Visit | Attending: Internal Medicine | Admitting: Internal Medicine

## 2015-03-29 DIAGNOSIS — K219 Gastro-esophageal reflux disease without esophagitis: Secondary | ICD-10-CM | POA: Insufficient documentation

## 2015-03-29 DIAGNOSIS — M1990 Unspecified osteoarthritis, unspecified site: Secondary | ICD-10-CM | POA: Diagnosis not present

## 2015-03-29 DIAGNOSIS — K222 Esophageal obstruction: Secondary | ICD-10-CM | POA: Diagnosis not present

## 2015-03-29 DIAGNOSIS — Z8546 Personal history of malignant neoplasm of prostate: Secondary | ICD-10-CM | POA: Diagnosis not present

## 2015-03-29 DIAGNOSIS — Z7982 Long term (current) use of aspirin: Secondary | ICD-10-CM | POA: Diagnosis not present

## 2015-03-29 DIAGNOSIS — E039 Hypothyroidism, unspecified: Secondary | ICD-10-CM | POA: Diagnosis not present

## 2015-03-29 DIAGNOSIS — Z96651 Presence of right artificial knee joint: Secondary | ICD-10-CM | POA: Diagnosis not present

## 2015-03-29 DIAGNOSIS — R1310 Dysphagia, unspecified: Secondary | ICD-10-CM | POA: Diagnosis not present

## 2015-03-29 DIAGNOSIS — Z87891 Personal history of nicotine dependence: Secondary | ICD-10-CM | POA: Diagnosis not present

## 2015-03-29 DIAGNOSIS — K449 Diaphragmatic hernia without obstruction or gangrene: Secondary | ICD-10-CM | POA: Diagnosis not present

## 2015-03-29 DIAGNOSIS — Z79899 Other long term (current) drug therapy: Secondary | ICD-10-CM | POA: Insufficient documentation

## 2015-03-29 DIAGNOSIS — Z85820 Personal history of malignant melanoma of skin: Secondary | ICD-10-CM | POA: Insufficient documentation

## 2015-03-29 HISTORY — PX: ESOPHAGOGASTRODUODENOSCOPY: SHX5428

## 2015-03-29 HISTORY — PX: ESOPHAGEAL DILATION: SHX303

## 2015-03-29 SURGERY — EGD (ESOPHAGOGASTRODUODENOSCOPY)
Anesthesia: Moderate Sedation

## 2015-03-29 MED ORDER — BUTAMBEN-TETRACAINE-BENZOCAINE 2-2-14 % EX AERO
INHALATION_SPRAY | CUTANEOUS | Status: DC | PRN
  Administered 2015-03-29: 2 via TOPICAL

## 2015-03-29 MED ORDER — MIDAZOLAM HCL 5 MG/5ML IJ SOLN
INTRAMUSCULAR | Status: DC | PRN
  Administered 2015-03-29 (×2): 1 mg via INTRAVENOUS

## 2015-03-29 MED ORDER — STERILE WATER FOR IRRIGATION IR SOLN
Status: DC | PRN
  Administered 2015-03-29: 08:00:00

## 2015-03-29 MED ORDER — MIDAZOLAM HCL 5 MG/5ML IJ SOLN
INTRAMUSCULAR | Status: AC
  Filled 2015-03-29: qty 10

## 2015-03-29 MED ORDER — MEPERIDINE HCL 50 MG/ML IJ SOLN
INTRAMUSCULAR | Status: DC | PRN
  Administered 2015-03-29: 25 mg via INTRAVENOUS

## 2015-03-29 MED ORDER — SODIUM CHLORIDE 0.9 % IV SOLN
INTRAVENOUS | Status: DC
  Administered 2015-03-29: 1000 mL via INTRAVENOUS

## 2015-03-29 MED ORDER — MEPERIDINE HCL 50 MG/ML IJ SOLN
INTRAMUSCULAR | Status: AC
  Filled 2015-03-29: qty 1

## 2015-03-29 NOTE — Op Note (Signed)
EGD PROCEDURE REPORT  PATIENT:  Evan Miller  MR#:  [id number] Birthdate:  [birth year], 81 y.o., male Endoscopist:  Dr. Rogene Houston, MD Referred By:  Dr.  Lanette Hampshire, MD Procedure Date: 03/29/2015  Procedure:   EGD with ED  Indications:   Patient is 81 year old Caucasian male underwent EGD on 02/10/2015 for 1 year history of intermittent solid food dysphagia. He was found have erosive reflux esophagitis and high-grade stricture at GE junction. This stricture was dilated only to 16.5 mm. He is now returning for repeat evaluation and dilation to 18 mm is appropriate.  He is able to swallow much better.          Informed Consent:  The risks, benefits, alternatives & imponderables which include, but are not limited to, bleeding, infection, perforation, drug reaction and potential missed lesion have been reviewed.  The potential for biopsy, lesion removal, esophageal dilation, etc. have also been discussed.  Questions have been answered.  All parties agreeable.  Please see history & physical in medical record for more information.  Medications:  Demerol 25 mg IV Versed 2 mg IV Cetacaine spray topically for oropharyngeal anesthesia  Description of procedure:  The endoscope was introduced through the mouth and advanced to the second portion of the duodenum without difficulty or limitations. The mucosal surfaces were surveyed very carefully during advancement of the scope and upon withdrawal.  Findings:  Esophagus:   Mucosa of the esophagus was normal. Focal erythema noted at GE junction erosions had completely healed. Stricture noted at GE junction. Scope passed across it with minimal resistance. GEJ:  35 cm Hiatus:  39 cm Stomach:   Stomach was empty and distended very well with insufflation. Folds in the proximal stomach were normal. Examination of mucosa at gastric body was normal. Prepyloric scar noted. No erosions or ulcers. Pyloric channel was patent. Tenderness fundus and cardia  with examined by retroflex in the scope and were normal. Duodenum:   Normal bulbar and post bulbar mucosa.  Therapeutic/Diagnostic Maneuvers Performed:   Stricture at GE junction was dilated with balloon dilator. Balloon dilator was advanced through the scope. Guidewire was pushed into gastric lumen. Balloon was positioned across GE junction and insufflated to  diameter of 15 mm resulting in a small superficial mucosal disruption. The stricture was further dilated to 16.5 and finally to 18 mm.  Balloon was deflated.  I was able to pass the scope across GE junction without any resistance.  Complications:   none  EBL: Minimal  Impression:  Healed esophagitis with stricture at GE junction.  Small sliding hiatal hernia.  Stricture dilated with balloon dilator from 15 to18 mm   Recommendations:   Standard instructions given.  Resume aspirin on 04/01/2015.  Continue pantoprazole at 40 mg by mouth every morning.  Office visit in 6 months.  REHMAN,NAJEEB U  03/29/2015  8:34 AM  CC: Dr. Lanette Hampshire, MD & Dr. Rayne Du ref. provider found

## 2015-03-29 NOTE — H&P (Signed)
Evan Miller is an 81 y.o. male.   Chief Complaint:  Patient is here for EGD and ED. HPI:  Patient is 81 year old Caucasian male who underwent EGD on 02/10/2015 for dysphagia. He was found to have erosive reflux esophagitis and high-grade stricture at GE junction which was dilated to 16.5 mm. He was begun on pantoprazole. He states he is able to swallow much better. He's not had any episodes of food impaction. He feels heartburns well controlled with therapy. He denies weight loss abdominal pain or melena.  Past Medical History  Diagnosis Date  . PONV (postoperative nausea and vomiting)   . Hypothyroidism   . Hepatitis     age 12 years  . GERD (gastroesophageal reflux disease)   . Arthritis     back,shoulders and hips  . Cancer ([place])     basal cell/ Melanoma 1997 left  shoulder/  PROSTATE    Past Surgical History  Procedure Laterality Date  . Joint replacement      left knee 9/12  . Hernia repair    . Hydrocele excision      bilateral  . Colonoscopy    . Total knee arthroplasty  06/24/2011    Procedure: TOTAL KNEE ARTHROPLASTY;  Surgeon: Gearlean Alf;  Location: [facility];  Service: Orthopedics;  Laterality: Right;  . Cataract extraction w/phaco  12/16/2011    Procedure: CATARACT EXTRACTION PHACO AND INTRAOCULAR LENS PLACEMENT (IOC);  Surgeon: Tonny Branch, MD;  Location: [facility];  Service: Ophthalmology;  Laterality: Left;  CDE:17.24   . Cataract extraction w/phaco  12/30/2011    Procedure: CATARACT EXTRACTION PHACO AND INTRAOCULAR LENS PLACEMENT (IOC);  Surgeon: Tonny Branch, MD;  Location: [facility];  Service: Ophthalmology;  Laterality: Right;  CDE 15.40  . Colonoscopy N/A 08/07/2012    Procedure: COLONOSCOPY;  Surgeon: Rogene Houston, MD;  [facility];  Service: Endoscopy;  Laterality: N/A;  10:00  . Esophagogastroduodenoscopy N/A 02/10/2015    Procedure: ESOPHAGOGASTRODUODENOSCOPY (EGD);  Surgeon: Rogene Houston, MD;  [facility]: [facility];  Service: Endoscopy;   Laterality: N/A;  925 - moved to 10:20 - Ann to notify pt  . Esophageal dilation N/A 02/10/2015    Procedure: ESOPHAGEAL DILATION;  Surgeon: Rogene Houston, MD;  Location: [facility];  Service: Endoscopy;  Laterality: N/A;    Family History  Problem Relation Age of Onset  . Breast cancer Mother   . Prostate cancer Brother   . Nephritis Brother   . Rectal cancer Brother   . Cirrhosis Brother   . Liver cancer Brother   . Healthy Daughter   . Healthy Son    Social History:  reports that he quit smoking about 52 years ago. His smoking use included Cigarettes. He has a 30 pack-year smoking history. He quit smokeless tobacco use about 8 years ago. His smokeless tobacco use included Chew. He reports that he does not drink alcohol or use illicit drugs.  Allergies:  Allergies  Allergen Reactions  . Penicillins Other (See Comments)    Reaction: large knots on body   . Sulfa Antibiotics Hives    Medications Prior to Admission  Medication Sig Dispense Refill  . aspirin 81 MG tablet Take 81 mg by mouth daily.    Marland Kitchen levothyroxine (SYNTHROID, LEVOTHROID) 100 MCG tablet Take 100 mcg by mouth daily before breakfast.     . pantoprazole (PROTONIX) 40 MG tablet Take 1 tablet (40 mg total) by mouth daily. 30 tablet 5  No results found for this or any previous visit (from the past 48 hour(s)). No results found.  ROS  Blood pressure 104/81, pulse 69, temperature 97.8 F (36.6 C), temperature source Oral, resp. rate 16, height 5\' 4"  (1.626 m), weight 178 lb (80.74 kg), SpO2 97 %. Physical Exam  Constitutional: He appears well-developed and well-nourished.  HENT:  Mouth/Throat: Oropharynx is clear and moist.  Eyes: Conjunctivae are normal. No scleral icterus.  Neck: No thyromegaly present.  Cardiovascular: Normal rate, regular rhythm and normal heart sounds.   No murmur heard. Respiratory: Effort normal and breath sounds normal.  GI: Soft. He exhibits no distension and no mass. There is  no tenderness.  Musculoskeletal: He exhibits no edema.  Lymphadenopathy:    He has no cervical adenopathy.  Neurological: He is alert.  Skin: Skin is warm and dry.     Assessment/Plan Solid food dysphagia. Esophageal stricture secondary to GERD.  EGD with plan to dilate stricture to 18 mm.  Jesstin Studstill U 03/29/2015, 8:12 AM

## 2015-03-29 NOTE — Discharge Instructions (Signed)
Resume aspirin on 04/01/2015.  Resume other medications and diet as before.  No driving for 24 hours.  Office visit in 6 months  Gastrointestinal Endoscopy, Care After Refer to this sheet in the next few weeks. These instructions provide you with information on caring for yourself after your procedure. Your caregiver may also give you more specific instructions. Your treatment has been planned according to current medical practices, but problems sometimes occur. Call your caregiver if you have any problems or questions after your procedure. HOME CARE INSTRUCTIONS  If you were given medicine to help you relax (sedative), do not drive, operate machinery, or sign important documents for 24 hours.  Avoid alcohol and hot or warm beverages for the first 24 hours after the procedure.  Only take over-the-counter or prescription medicines for pain, discomfort, or fever as directed by your caregiver. You may resume taking your normal medicines unless your caregiver tells you otherwise. Ask your caregiver when you may resume taking medicines that may cause bleeding, such as aspirin, clopidogrel, or warfarin.  You may return to your normal diet and activities on the day after your procedure, or as directed by your caregiver. Walking may help to reduce any bloated feeling in your abdomen.  Drink enough fluids to keep your urine clear or pale yellow.  You may gargle with salt water if you have a sore throat. SEEK IMMEDIATE MEDICAL CARE IF:  You have severe nausea or vomiting.  You have severe abdominal pain, abdominal cramps that last longer than 6 hours, or abdominal swelling (distention).  You have severe shoulder or back pain.  You have trouble swallowing.  You have shortness of breath, your breathing is shallow, or you are breathing faster than normal.  You have a fever or a rapid heartbeat.  You vomit blood or material that looks like coffee grounds.  You have bloody, black, or tarry  stools. MAKE SURE YOU:  Understand these instructions.  Will watch your condition.  Will get help right away if you are not doing well or get worse.   This information is not intended to replace advice given to you by your health care provider. Make sure you discuss any questions you have with your health care provider.   Document Released: 01/09/2004 Document Revised: 06/17/2014 Document Reviewed: 08/27/2011 Elsevier Interactive Patient Education [employer].

## 2015-03-31 ENCOUNTER — Encounter (HOSPITAL_COMMUNITY): Payer: Self-pay | Admitting: Internal Medicine

## 2015-08-22 ENCOUNTER — Encounter (INDEPENDENT_AMBULATORY_CARE_PROVIDER_SITE_OTHER): Payer: Self-pay | Admitting: *Deleted

## 2015-09-27 ENCOUNTER — Ambulatory Visit (INDEPENDENT_AMBULATORY_CARE_PROVIDER_SITE_OTHER): Payer: Medicare HMO | Admitting: Internal Medicine

## 2015-09-27 ENCOUNTER — Other Ambulatory Visit (INDEPENDENT_AMBULATORY_CARE_PROVIDER_SITE_OTHER): Payer: Self-pay | Admitting: Internal Medicine

## 2015-09-27 ENCOUNTER — Other Ambulatory Visit (INDEPENDENT_AMBULATORY_CARE_PROVIDER_SITE_OTHER): Payer: Self-pay | Admitting: *Deleted

## 2015-09-27 ENCOUNTER — Encounter (INDEPENDENT_AMBULATORY_CARE_PROVIDER_SITE_OTHER): Payer: Self-pay | Admitting: Internal Medicine

## 2015-09-27 ENCOUNTER — Encounter (INDEPENDENT_AMBULATORY_CARE_PROVIDER_SITE_OTHER): Payer: Self-pay | Admitting: *Deleted

## 2015-09-27 VITALS — BP 104/58 | HR 64 | Temp 97.4°F | Ht 64.0 in | Wt 177.2 lb

## 2015-09-27 DIAGNOSIS — R1310 Dysphagia, unspecified: Secondary | ICD-10-CM

## 2015-09-27 DIAGNOSIS — Z86010 Personal history of colonic polyps: Secondary | ICD-10-CM

## 2015-09-27 MED ORDER — PANTOPRAZOLE SODIUM 40 MG PO TBEC: 40.0000 mg | DELAYED_RELEASE_TABLET | Freq: Every day | ORAL | Status: DC

## 2015-09-27 NOTE — Progress Notes (Signed)
Subjective:    Patient ID: Evan Miller, male    DOB: 1925/12/13, 82 y.o.   MRN: [id number]  HPI Here today for f/u of his dysphagia. He underwent a repeat EGD in October of 2016 for erosive reflux esophagitis and a high grade stricture. Weight in August of 2016 was 178. Today is weight is 177.2. He tells me he is having dysphagia. Certain foods such as cornbread and lettuce feels like they are lodging.  Chicken bother him. He can eat hamburger without any problem.  He tells me he is 90% better as far as his swallowing is concerned.  Symptoms started about a week ago.  He has been off the Protonix for about 3 weeks.  Appetite is good. No weight loss.  Hx of large sessile polyp at the cecum and multiple polyps on colonoscopy in 2014. Due for surveillance.       08/03/2012 Colonoscopy with snare polypectomy, APC therapy and Hemoclip application. Examination performed to cecum. Large sessile polyp at cecum snared piecemeal. Part of the polyp was ablated with APC and two hemoclips applied for hemostasis. Two small polyps snared from ascending colon and submitted together. Five small polyps snared from transverse colon and submitted together. Moderate sigmoid colon diverticulosis. Small external hemorrhoids. Notes Recorded by Rogene Houston, MD on 08/11/2012 at 5:41 PM All polyps are tubular adenomas including large cecal polyp. Results given to patient. Next colonoscopy in 3 years as long he remains in good health. Report to PCP     03/29/2015   EGD with ED  Indications:   Patient is 82 year old Caucasian male underwent EGD on 02/10/2015 for 1 year history of intermittent solid food dysphagia. He was found have erosive reflux esophagitis and high-grade stricture at GE junction. This stricture was dilated only to 16.5 mm. He is now returning for repeat evaluation and dilation to 18 mm is appropriate.  He is able to swallow much better.                                                                                            Impression:  Healed esophagitis with stricture at GE junction.  Small sliding hiatal hernia.  Stricture dilated with balloon dilator from 15 to18 mm   02/10/2015   EGD with ED  Indications:  Patient is 82 year old Caucasian male who presents with one-year history of intermittent solid food dysphagia. He denies nausea vomiting melena or weight loss. He states he used to have heartburn frequently but not anymore.                   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 This stricture was dilated to 16.5 mm with the balloon dilator. Small sliding hiatal hernia.  Review of Systems Past Medical History  Diagnosis Date  . PONV (postoperative nausea and vomiting)   . Hypothyroidism   . Hepatitis     age 10 years  . GERD (gastroesophageal reflux disease)   . Arthritis     back,shoulders and hips  . Cancer ([place])     basal cell/ Melanoma 1997 left  shoulder/  PROSTATE    Past Surgical History  Procedure Laterality Date  . Joint replacement      left knee 9/12  . Hernia repair    . Hydrocele excision      bilateral  . Colonoscopy    . Total knee arthroplasty  06/24/2011    Procedure: TOTAL KNEE ARTHROPLASTY;  Surgeon: Gearlean Alf;  Location: [facility];  Service: Orthopedics;  Laterality: Right;  . Cataract extraction w/phaco  12/16/2011    Procedure: CATARACT EXTRACTION PHACO AND INTRAOCULAR LENS PLACEMENT (IOC);  Surgeon: Tonny Branch, MD;  Location: [facility];  Service: Ophthalmology;  Laterality: Left;  CDE:17.24   . Cataract extraction w/phaco  12/30/2011    Procedure: CATARACT EXTRACTION PHACO AND INTRAOCULAR LENS PLACEMENT (IOC);  Surgeon: Tonny Branch, MD;  Location: [facility];  Service: Ophthalmology;  Laterality: Right;  CDE 15.40  . Colonoscopy N/A 08/07/2012    Procedure: COLONOSCOPY;  Surgeon: Rogene Houston, MD;  [facility];  Service: Endoscopy;  Laterality: N/A;  10:00  . Esophagogastroduodenoscopy N/A 02/10/2015    Procedure: ESOPHAGOGASTRODUODENOSCOPY (EGD);  Surgeon: Rogene Houston, MD;  [facility];  Service: Endoscopy;  Laterality: N/A;  925 - moved to 10:20 - Ann to notify pt  . Esophageal dilation N/A 02/10/2015    Procedure: ESOPHAGEAL DILATION;  Surgeon: Rogene Houston, MD;  [facility];  Service: Endoscopy;  Laterality: N/A;  . Esophagogastroduodenoscopy N/A 03/29/2015    Procedure: ESOPHAGOGASTRODUODENOSCOPY (EGD);  Surgeon: Rogene Houston, MD;  [facility];  Service: Endoscopy;  Laterality: N/A;  145 - moved to 8:30 - Ann notified pt  . Esophageal dilation N/A 03/29/2015    Procedure: ESOPHAGEAL DILATION;  Surgeon: Rogene Houston, MD;  [facility];  Service: Endoscopy;  Laterality: N/A;    Allergies  Allergen Reactions  . Penicillins Other (See Comments)    Reaction: large knots on body   . Sulfa Antibiotics Hives    Current Outpatient Prescriptions on File Prior to Visit  Medication Sig Dispense Refill  . levothyroxine (SYNTHROID, LEVOTHROID) 100 MCG tablet Take 100 mcg by mouth daily before breakfast.      No current facility-administered medications on file prior to visit.        Objective:   Physical Exam Blood pressure 104/58, pulse 64, temperature 97.4 F (36.3 C), height 5\' 4"  (1.626 m), weight 177 lb 3.2 oz (80.377 kg). Alert and oriented. Skin warm and dry. Oral mucosa is moist.   . Sclera anicteric, conjunctivae is pink. Thyroid not enlarged. No cervical lymphadenopathy. Lungs clear. Heart regular rate and rhythm.  Abdomen is soft. Bowel sounds are positive. No hepatomegaly. No abdominal masses felt. No tenderness.  No edema to lower extremities.          Assessment & Plan:  Dysphagia. He seems much better. No problems eating or swallowing. Has been off Protonix for a bout 3 weeks. Will  restart Protonix. I discussed with Dr. Laural Golden. If dysphagia continues, he will have an EGD/ED at time of colonoscopy.  He will restart the Protonix. He will have an  OV in 6 weeks.  Recall for a colonoscopy for hx of multiple and large cecal polyp.

## 2015-09-27 NOTE — Patient Instructions (Addendum)
Rx for Protonix 40mg  daily.   Colonoscopy. The risks and benefits such as perforation, bleeding, and infection were reviewed with the patient and is agreeable.

## 2015-09-27 NOTE — Telephone Encounter (Signed)
Patient needs trilyte 

## 2015-10-03 MED ORDER — PEG 3350-KCL-NA BICARB-NACL 420 G PO SOLR: 4000.0000 mL | Freq: Once | ORAL | Status: DC

## 2015-11-22 ENCOUNTER — Encounter (HOSPITAL_COMMUNITY)
Admission: RE | Disposition: A | Discharge status: Home Health | Payer: Self-pay | Source: Ambulatory Visit | Attending: Internal Medicine

## 2015-11-22 ENCOUNTER — Ambulatory Visit (HOSPITAL_COMMUNITY)
Admission: RE | Admit: 2015-11-22 | Discharge: 2015-11-22 | Disposition: A | Discharge status: Home Health | Payer: Medicare HMO | Source: Ambulatory Visit | Attending: Internal Medicine | Admitting: Internal Medicine

## 2015-11-22 ENCOUNTER — Encounter (HOSPITAL_COMMUNITY): Payer: Self-pay | Admitting: *Deleted

## 2015-11-22 DIAGNOSIS — Z87891 Personal history of nicotine dependence: Secondary | ICD-10-CM | POA: Insufficient documentation

## 2015-11-22 DIAGNOSIS — Z7982 Long term (current) use of aspirin: Secondary | ICD-10-CM | POA: Diagnosis not present

## 2015-11-22 DIAGNOSIS — Z803 Family history of malignant neoplasm of breast: Secondary | ICD-10-CM | POA: Insufficient documentation

## 2015-11-22 DIAGNOSIS — M19011 Primary osteoarthritis, right shoulder: Secondary | ICD-10-CM | POA: Insufficient documentation

## 2015-11-22 DIAGNOSIS — M19012 Primary osteoarthritis, left shoulder: Secondary | ICD-10-CM | POA: Diagnosis not present

## 2015-11-22 DIAGNOSIS — Z808 Family history of malignant neoplasm of other organs or systems: Secondary | ICD-10-CM | POA: Diagnosis not present

## 2015-11-22 DIAGNOSIS — E039 Hypothyroidism, unspecified: Secondary | ICD-10-CM | POA: Diagnosis not present

## 2015-11-22 DIAGNOSIS — M479 Spondylosis, unspecified: Secondary | ICD-10-CM | POA: Diagnosis not present

## 2015-11-22 DIAGNOSIS — K644 Residual hemorrhoidal skin tags: Secondary | ICD-10-CM

## 2015-11-22 DIAGNOSIS — Z85820 Personal history of malignant melanoma of skin: Secondary | ICD-10-CM | POA: Insufficient documentation

## 2015-11-22 DIAGNOSIS — Z79899 Other long term (current) drug therapy: Secondary | ICD-10-CM | POA: Insufficient documentation

## 2015-11-22 DIAGNOSIS — Z86010 Personal history of colonic polyps: Secondary | ICD-10-CM

## 2015-11-22 DIAGNOSIS — K222 Esophageal obstruction: Secondary | ICD-10-CM | POA: Diagnosis not present

## 2015-11-22 DIAGNOSIS — D122 Benign neoplasm of ascending colon: Secondary | ICD-10-CM | POA: Insufficient documentation

## 2015-11-22 DIAGNOSIS — Z96653 Presence of artificial knee joint, bilateral: Secondary | ICD-10-CM | POA: Diagnosis not present

## 2015-11-22 DIAGNOSIS — Z8042 Family history of malignant neoplasm of prostate: Secondary | ICD-10-CM | POA: Diagnosis not present

## 2015-11-22 DIAGNOSIS — D120 Benign neoplasm of cecum: Secondary | ICD-10-CM | POA: Diagnosis not present

## 2015-11-22 DIAGNOSIS — Z800 Family history of malignant neoplasm of digestive organs: Secondary | ICD-10-CM | POA: Insufficient documentation

## 2015-11-22 DIAGNOSIS — R1319 Other dysphagia: Secondary | ICD-10-CM | POA: Insufficient documentation

## 2015-11-22 DIAGNOSIS — K5730 Diverticulosis of large intestine without perforation or abscess without bleeding: Secondary | ICD-10-CM | POA: Diagnosis not present

## 2015-11-22 DIAGNOSIS — Z8546 Personal history of malignant neoplasm of prostate: Secondary | ICD-10-CM | POA: Diagnosis not present

## 2015-11-22 DIAGNOSIS — K219 Gastro-esophageal reflux disease without esophagitis: Secondary | ICD-10-CM | POA: Insufficient documentation

## 2015-11-22 DIAGNOSIS — K449 Diaphragmatic hernia without obstruction or gangrene: Secondary | ICD-10-CM | POA: Insufficient documentation

## 2015-11-22 DIAGNOSIS — Z1211 Encounter for screening for malignant neoplasm of colon: Secondary | ICD-10-CM | POA: Insufficient documentation

## 2015-11-22 DIAGNOSIS — K5520 Angiodysplasia of colon without hemorrhage: Secondary | ICD-10-CM

## 2015-11-22 DIAGNOSIS — Z09 Encounter for follow-up examination after completed treatment for conditions other than malignant neoplasm: Secondary | ICD-10-CM

## 2015-11-22 HISTORY — PX: COLONOSCOPY: SHX5424

## 2015-11-22 HISTORY — PX: ESOPHAGEAL DILATION: SHX303

## 2015-11-22 HISTORY — PX: ESOPHAGOGASTRODUODENOSCOPY: SHX5428

## 2015-11-22 SURGERY — COLONOSCOPY
Anesthesia: Moderate Sedation

## 2015-11-22 MED ORDER — MIDAZOLAM HCL 5 MG/5ML IJ SOLN
INTRAMUSCULAR | Status: AC
  Filled 2015-11-22: qty 10

## 2015-11-22 MED ORDER — MEPERIDINE HCL 50 MG/ML IJ SOLN
INTRAMUSCULAR | Status: AC
  Filled 2015-11-22: qty 1

## 2015-11-22 MED ORDER — SODIUM CHLORIDE 0.9 % IV SOLN
INTRAVENOUS | Status: DC
  Administered 2015-11-22: 1000 mL via INTRAVENOUS

## 2015-11-22 MED ORDER — STERILE WATER FOR IRRIGATION IR SOLN
Status: DC | PRN
  Administered 2015-11-22: 11:00:00

## 2015-11-22 MED ORDER — MEPERIDINE HCL 50 MG/ML IJ SOLN
INTRAMUSCULAR | Status: DC | PRN
  Administered 2015-11-22: 25 mg via INTRAVENOUS

## 2015-11-22 MED ORDER — BUTAMBEN-TETRACAINE-BENZOCAINE 2-2-14 % EX AERO
INHALATION_SPRAY | CUTANEOUS | Status: DC | PRN
  Administered 2015-11-22: 2 via TOPICAL

## 2015-11-22 MED ORDER — MIDAZOLAM HCL 5 MG/5ML IJ SOLN
INTRAMUSCULAR | Status: DC | PRN
  Administered 2015-11-22 (×3): 1 mg via INTRAVENOUS

## 2015-11-22 NOTE — Op Note (Signed)
[facility] Patient Name: Evan Miller Procedure Date: 11/22/2015 10:08 AM MRN: [id number] Date of Birth: 03-Sep-1925 Attending MD: Hildred Laser , MD CSN: [id number] Age: 82 Admit Type: Outpatient Procedure:                Colonoscopy Indications:              High risk colon cancer surveillance: Personal                            history of colonic polyps Providers:                Hildred Laser, MD, Janeece Riggers, RN, Bonnetta Barry,                            Technician Referring MD:             Hurshel Party, MD Medicines:                Midazolam 1 mg IV Complications:            No immediate complications. Estimated Blood Loss:     Estimated blood loss was minimal. Procedure:                Pre-Anesthesia Assessment:                           - Prior to the procedure, a History and Physical                            was performed, and patient medications and                            allergies were reviewed. The patient's tolerance of                            previous anesthesia was also reviewed. The risks                            and benefits of the procedure and the sedation                            options and risks were discussed with the patient.                            All questions were answered, and informed consent                            was obtained. Prior Anticoagulants: The patient                            last took aspirin 2 days prior to the procedure.                            ASA Grade Assessment: II - A patient with mild  systemic disease. After reviewing the risks and                            benefits, the patient was deemed in satisfactory                            condition to undergo the procedure.                           After obtaining informed consent, the colonoscope                            was passed under direct vision. Throughout the                            procedure, the patient's blood  pressure, pulse, and                            oxygen saturations were monitored continuously. The                            [id number]) scope was introduced through                            the anus and advanced to the the cecum, identified                            by appendiceal orifice and ileocecal valve. The                            colonoscopy was performed without difficulty. The                            patient tolerated the procedure well. The quality                            of the bowel preparation was adequate. The                            ileocecal valve, appendiceal orifice, and rectum                            were photographed. Scope In: 10:58:34 AM Scope Out: 11:24:58 AM Scope Withdrawal Time: 0 hours 17 minutes 57 seconds  Total Procedure Duration: 0 hours 26 minutes 24 seconds  Findings:      A single medium-sized angiodysplastic lesion without bleeding was found       in the cecum.      A 8 mm polyp was found in the cecum. The polyp was semi-sessile. The       polyp was removed with a hot snare. Resection and retrieval were       complete. [Reason]prevent bleeding, one hemostatic clip was successfully       placed (MR conditional). There was no bleeding at the end of the       procedure.  Three sessile polyps were found in the ascending colon and cecum. The       polyps were small in size. These polyps were removed with a cold snare.       Resection and retrieval were complete.      A few diverticula were found in the sigmoid colon.      External hemorrhoids were found during retroflexion. The hemorrhoids       were small. Impression:               - A single non-bleeding colonic angiodysplastic                            lesion.                           - One 8 mm polyp in the cecum, removed with a hot                            snare. Resected and retrieved. Clip (MR                            conditional) was placed.                            - Three small polyps in the ascending colon and in                            the cecum, removed with a cold snare. Resected and                            retrieved.                           - Diverticulosis in the sigmoid colon.                           - External hemorrhoids. Moderate Sedation:      Moderate (conscious) sedation was administered by the endoscopy nurse       and supervised by the endoscopist. The following parameters were       monitored: oxygen saturation, heart rate, blood pressure, CO2       capnography and response to care. Total physician intraservice time was       45 minutes. Recommendation:           - Patient has a contact number available for                            emergencies. The signs and symptoms of potential                            delayed complications were discussed with the                            patient. Return to normal activities tomorrow.  Written discharge instructions were provided to the                            patient.                           - Resume previous diet today.                           - Continue present medications.                           - Resume aspirin at prior dose in 5 days.                           - Await pathology results.                           - Repeat colonoscopy is not recommended. Procedure Code(s):        --- Professional ---                           [id number], Colonoscopy, flexible; with removal of                            tumor(s), polyp(s), or other lesion(s) by snare                            technique                           99152, Moderate sedation services provided by the                            same physician or other qualified health care                            professional performing the diagnostic or                            therapeutic service that the sedation supports,                            requiring the presence of an  independent trained                            observer to assist in the monitoring of the                            patient's level of consciousness and physiological                            status; initial 15 minutes of intraservice time,                            patient age 16 years or  older                           [phone], Moderate sedation services; each additional                            15 minutes intraservice time                           99153, Moderate sedation services; each additional                            15 minutes intraservice time Diagnosis Code(s):        --- Professional ---                           Z86.010, Personal history of colonic polyps                           K55.20, Angiodysplasia of colon without hemorrhage                           D12.0, Benign neoplasm of cecum                           D12.2, Benign neoplasm of ascending colon                           K64.4, Residual hemorrhoidal skin tags                           K57.30, Diverticulosis of large intestine without                            perforation or abscess without bleeding CPT copyright 2016 American Medical Association. All rights reserved. The codes documented in this report are preliminary and upon coder review may  be revised to meet current compliance requirements. Hildred Laser, MD Hildred Laser, MD 11/22/2015 11:39:51 AM This report has been signed electronically. Number of Addenda: 0

## 2015-11-22 NOTE — Op Note (Signed)
[facility] Patient Name: Evan Miller Procedure Date: 11/22/2015 10:25 AM MRN: [id number] Date of Birth: 1926-05-24 Attending MD: Hildred Laser , MD CSN: [id number] Age: 82 Admit Type: Outpatient Procedure:                Upper GI endoscopy Indications:              Esophageal dysphagia, Gastro-esophageal reflux                            disease Providers:                Hildred Laser, MD, Janeece Riggers, RN, Bonnetta Barry,                            Technician Referring MD:             Hurshel Party, MD Medicines:                Cetacaine spray, Meperidine 25 mg IV, Midazolam 2                            mg IV Complications:            No immediate complications. Estimated Blood Loss:     Estimated blood loss was minimal. Procedure:                Pre-Anesthesia Assessment:                           - Prior to the procedure, a History and Physical                            was performed, and patient medications and                            allergies were reviewed. The patient's tolerance of                            previous anesthesia was also reviewed. The risks                            and benefits of the procedure and the sedation                            options and risks were discussed with the patient.                            All questions were answered, and informed consent                            was obtained. Prior Anticoagulants: The patient                            last took aspirin 2 days prior to the procedure.                            ASA  Grade Assessment: II - A patient with mild                            systemic disease. After reviewing the risks and                            benefits, the patient was deemed in satisfactory                            condition to undergo the procedure.                           After obtaining informed consent, the endoscope was                            passed under direct vision. Throughout the                     procedure, the patient's blood pressure, pulse, and                            oxygen saturations were monitored continuously. The                            Endoscope was introduced through the mouth, and                            advanced to the second part of duodenum. The upper                            GI endoscopy was accomplished without difficulty.                            The patient tolerated the procedure well. Scope In: 10:44:39 AM Scope Out: 10:52:04 AM Total Procedure Duration: 0 hours 7 minutes 25 seconds  Findings:      The examined esophagus was normal.      One moderate benign-appearing, intrinsic stenosis was found 39 cm from       the incisors. This measured 1 cm (inner diameter) x less than one cm (in       length) and was traversed. A TTS dilator was passed through the scope.       Dilation with a 15-16.5-18 mm balloon dilator was performed to 18 mm.       The dilation site was examined and showed moderate improvement in       luminal narrowing. Estimated blood loss was minimal.      A 3 cm hiatal hernia was present.      The entire examined stomach was normal.      The duodenal bulb and second portion of the duodenum were normal. Impression:               - Normal esophagus.                           - Benign-appearing esophageal stenosis. Dilated.                           -  3 cm hiatal hernia.                           - Normal stomach.                           - Normal duodenal bulb and second portion of the                            duodenum.                           - No specimens collected. Moderate Sedation:      Moderate (conscious) sedation was administered by the endoscopy nurse       and supervised by the endoscopist. The following parameters were       monitored: oxygen saturation, heart rate, blood pressure, CO2       capnography and response to care. Total physician intraservice time was       12 minutes. Recommendation:            - Patient has a contact number available for                            emergencies. The signs and symptoms of potential                            delayed complications were discussed with the                            patient. Return to normal activities tomorrow.                            Written discharge instructions were provided to the                            patient.                           - Resume previous diet today.                           - Continue present medications.                           - Resume aspirin at prior dose in 2 days.                           - Return to GI office in 6 months. Procedure Code(s):        --- Professional ---                           [phone], Esophagogastroduodenoscopy, flexible,                            transoral; with transendoscopic balloon dilation of  esophagus (less than 30 mm diameter)                           99152, Moderate sedation services provided by the                            same physician or other qualified health care                            professional performing the diagnostic or                            therapeutic service that the sedation supports,                            requiring the presence of an independent trained                            observer to assist in the monitoring of the                            patient's level of consciousness and physiological                            status; initial 15 minutes of intraservice time,                            patient age 12 years or older Diagnosis Code(s):        --- Professional ---                           K22.2, Esophageal obstruction                           K44.9, Diaphragmatic hernia without obstruction or                            gangrene                           R13.14, Dysphagia, pharyngoesophageal phase                           K21.9, Gastro-esophageal reflux disease without                             esophagitis CPT copyright 2016 American Medical Association. All rights reserved. The codes documented in this report are preliminary and upon coder review may  be revised to meet current compliance requirements. Hildred Laser, MD Hildred Laser, MD 11/22/2015 11:26:51 AM This report has been signed electronically. Number of Addenda: 0

## 2015-11-22 NOTE — H&P (Signed)
Evan Miller is an 82 y.o. male.   Chief Complaint: Patient is here for EGD, ED and colonoscopy. HPI: Patient is 82 year old Caucasian male was chronic GERD complicated by esophageal stricture which has been dilated twice in the past and presents with few week history of dysphagia to solids. He says heartburns well controlled with therapy. He denies nausea vomiting. He also has history of multiple colonic polyps. Last colonoscopy was in February 2014 with removal of large cecal polyp which was removed piecemeal.  He denies rectal bleeding. Family history significant for rectal carcinoma in one brother.  Past Medical History  Diagnosis Date  . PONV (postoperative nausea and vomiting)   . Hypothyroidism   . Hepatitis     age 74 years  . GERD (gastroesophageal reflux disease)   . Arthritis     back,shoulders and hips  . Cancer ([place])     basal cell/ Melanoma 1997 left  shoulder/  PROSTATE    Past Surgical History  Procedure Laterality Date  . Joint replacement      left knee 9/12  . Hernia repair    . Hydrocele excision      bilateral  . Colonoscopy    . Total knee arthroplasty  06/24/2011    Procedure: TOTAL KNEE ARTHROPLASTY;  Surgeon: Gearlean Alf;  Location: [facility];  Service: Orthopedics;  Laterality: Right;  . Cataract extraction w/phaco  12/16/2011    Procedure: CATARACT EXTRACTION PHACO AND INTRAOCULAR LENS PLACEMENT (IOC);  Surgeon: Tonny Branch, MD;  Location: [facility];  Service: Ophthalmology;  Laterality: Left;  CDE:17.24   . Cataract extraction w/phaco  12/30/2011    Procedure: CATARACT EXTRACTION PHACO AND INTRAOCULAR LENS PLACEMENT (IOC);  Surgeon: Tonny Branch, MD;  Location: [facility];  Service: Ophthalmology;  Laterality: Right;  CDE 15.40  . Colonoscopy N/A 08/07/2012    Procedure: COLONOSCOPY;  Surgeon: Rogene Houston, MD;  Location: [facility];  Service: Endoscopy;  Laterality: N/A;  10:00  . Esophagogastroduodenoscopy N/A 02/10/2015    Procedure:  ESOPHAGOGASTRODUODENOSCOPY (EGD);  Surgeon: Rogene Houston, MD;  Location: [facility];  Service: Endoscopy;  Laterality: N/A;  925 - moved to 10:20 - Ann to notify pt  . Esophageal dilation N/A 02/10/2015    Procedure: ESOPHAGEAL DILATION;  Surgeon: Rogene Houston, MD;  Location: [facility];  Service: Endoscopy;  Laterality: N/A;  . Esophagogastroduodenoscopy N/A 03/29/2015    Procedure: ESOPHAGOGASTRODUODENOSCOPY (EGD);  Surgeon: Rogene Houston, MD;  Location: [facility];  Service: Endoscopy;  Laterality: N/A;  145 - moved to 8:30 - Ann notified pt  . Esophageal dilation N/A 03/29/2015    Procedure: ESOPHAGEAL DILATION;  Surgeon: Rogene Houston, MD;  Location: [facility];  Service: Endoscopy;  Laterality: N/A;    Family History  Problem Relation Age of Onset  . Breast cancer Mother   . Prostate cancer Brother   . Nephritis Brother   . Rectal cancer Brother   . Cirrhosis Brother   . Liver cancer Brother   . Healthy Daughter   . Healthy Son    Social History:  reports that he quit smoking about 53 years ago. His smoking use included Cigarettes. He has a 30 pack-year smoking history. He quit smokeless tobacco use about 9 years ago. His smokeless tobacco use included Chew. He reports that he does not drink alcohol or use illicit drugs.  Allergies:  Allergies  Allergen Reactions  . Penicillins Other (See Comments)    Reaction: large  knots on body Has patient had a PCN reaction causing immediate rash, facial/tongue/throat swelling, SOB or lightheadedness with hypotension: No Has patient had a PCN reaction causing severe rash involving mucus membranes or skin necrosis: No Has patient had a PCN reaction that required hospitalization No Has patient had a PCN reaction occurring within the last 10 years: No If all of the above answers are "NO", then may proceed with Cephalosporin use.   . Sulfa Antibiotics Hives    Medications Prior to Admission  Medication Sig Dispense  Refill  . aspirin 81 MG tablet Take 81 mg by mouth daily.    Marland Kitchen levothyroxine (SYNTHROID, LEVOTHROID) 100 MCG tablet Take 100 mcg by mouth daily before breakfast.     . pantoprazole (PROTONIX) 40 MG tablet Take 1 tablet (40 mg total) by mouth daily. 30 tablet 5  . polyethylene glycol-electrolytes (NULYTELY/GOLYTELY) 420 g solution Take 4,000 mLs by mouth once. 4000 mL 0    No results found for this or any previous visit (from the past 48 hour(s)). No results found.  ROS  Blood pressure 135/78, pulse 69, temperature 97.7 F (36.5 C), temperature source Oral, resp. rate 14, height 5\' 3"  (1.6 m), weight 177 lb (80.287 kg), SpO2 96 %. Physical Exam  Constitutional: He appears well-developed and well-nourished.  Vertical scar at mid forehead  HENT:  Mouth/Throat: Oropharynx is clear and moist.  Eyes: Conjunctivae are normal. No scleral icterus.  Neck: No thyromegaly present.  Cardiovascular: Normal rate, regular rhythm and normal heart sounds.   No murmur heard. Respiratory: Effort normal and breath sounds normal.  GI: Soft. He exhibits no distension and no mass. There is no tenderness.  Musculoskeletal: He exhibits no edema.  Lymphadenopathy:    He has no cervical adenopathy.  Neurological: He is alert.  Skin: Skin is warm and dry.     Assessment/Plan Solid food dysphagia in patient with chronic GERD and esophageal stricture. History of multiple colonic adenomas. EGD with ED and colonoscopy.  Hildred Laser, MD 11/22/2015, 10:33 AM

## 2015-11-22 NOTE — Discharge Instructions (Signed)
No aspirin or NSAIDs for 5 days. Resume other medications and diet as before. No driving for 24 hours. Physician will call with biopsy results.  Colonoscopy, Care After These instructions give you information on caring for yourself after your procedure. Your doctor may also give you more specific instructions. Call your doctor if you have any problems or questions after your procedure. HOME CARE  Do not drive for 24 hours.  Do not sign important papers or use machinery for 24 hours.  You may shower.  You may go back to your usual activities, but go slower for the first 24 hours.  Take rest breaks often during the first 24 hours.  Walk around or use warm packs on your belly (abdomen) if you have belly cramping or gas.  Drink enough fluids to keep your pee (urine) clear or pale yellow.  Resume your normal diet. Avoid heavy or fried foods.  Avoid drinking alcohol for 24 hours or as told by your doctor.  Only take medicines as told by your doctor. If a tissue sample (biopsy) was taken during the procedure:   Do not take aspirin or blood thinners for 7 days, or as told by your doctor.  Do not drink alcohol for 7 days, or as told by your doctor.  Eat soft foods for the first 24 hours. GET HELP IF: You still have a small amount of blood in your poop (stool) 2-3 days after the procedure. GET HELP RIGHT AWAY IF:  You have more than a small amount of blood in your poop.  You see clumps of tissue (blood clots) in your poop.  Your belly is puffy (swollen).  You feel sick to your stomach (nauseous) or throw up (vomit).  You have a fever.  You have belly pain that gets worse and medicine does not help. MAKE SURE YOU:  Understand these instructions.  Will watch your condition.  Will get help right away if you are not doing well or get worse.   This information is not intended to replace advice given to you by your health care provider. Make sure you discuss any questions  you have with your health care provider.   Document Released: 06/29/2010 Document Revised: 06/01/2013 Document Reviewed: 02/01/2013 Elsevier Interactive Patient Education 2016 Elsevier Inc[employer]   Colon Polyps Polyps are lumps of extra tissue growing inside the body. Polyps can grow in the large intestine (colon). Most colon polyps are noncancerous (benign). However, some colon polyps can become cancerous over time. Polyps that are larger than a pea may be harmful. To be safe, caregivers remove and test all polyps. CAUSES  Polyps form when mutations in the genes cause your cells to grow and divide even though no more tissue is needed. RISK FACTORS There are a number of risk factors that can increase your chances of getting colon polyps. They include:  Being older than 50 years.  Family history of colon polyps or colon cancer.  Long-term colon diseases, such as colitis or Crohn disease.  Being overweight.  Smoking.  Being inactive.  Drinking too much alcohol. SYMPTOMS  Most small polyps do not cause symptoms. If symptoms are present, they may include:  Blood in the stool. The stool may look dark red or black.  Constipation or diarrhea that lasts longer than 1 week. DIAGNOSIS People often do not know they have polyps until their caregiver finds them during a regular checkup. Your caregiver can use 4 tests to check for polyps:  Digital rectal exam. The  caregiver wears gloves and feels inside the rectum. This test would find polyps only in the rectum.  Barium enema. The caregiver puts a liquid called barium into your rectum before taking X-rays of your colon. Barium makes your colon look white. Polyps are dark, so they are easy to see in the X-ray pictures.  Sigmoidoscopy. A thin, flexible tube (sigmoidoscope) is placed into your rectum. The sigmoidoscope has a light and tiny camera in it. The caregiver uses the sigmoidoscope to look at the last third of your colon.  Colonoscopy.  This test is like sigmoidoscopy, but the caregiver looks at the entire colon. This is the most common method for finding and removing polyps. TREATMENT  Any polyps will be removed during a sigmoidoscopy or colonoscopy. The polyps are then tested for cancer. PREVENTION  To help lower your risk of getting more colon polyps:  Eat plenty of fruits and vegetables. Avoid eating fatty foods.  Do not smoke.  Avoid drinking alcohol.  Exercise every day.  Lose weight if recommended by your caregiver.  Eat plenty of calcium and folate. Foods that are rich in calcium include milk, cheese, and broccoli. Foods that are rich in folate include chickpeas, kidney beans, and spinach. HOME CARE INSTRUCTIONS Keep all follow-up appointments as directed by your caregiver. You may need periodic exams to check for polyps. SEEK MEDICAL CARE IF: You notice bleeding during a bowel movement.   This information is not intended to replace advice given to you by your health care provider. Make sure you discuss any questions you have with your health care provider.   Document Released: 02/21/2004 Document Revised: 06/17/2014 Document Reviewed: 08/06/2011 Elsevier Interactive Patient Education [employer].

## 2015-11-24 ENCOUNTER — Encounter (HOSPITAL_COMMUNITY): Payer: Self-pay | Admitting: Internal Medicine

## 2016-04-10 ENCOUNTER — Other Ambulatory Visit (INDEPENDENT_AMBULATORY_CARE_PROVIDER_SITE_OTHER): Payer: Self-pay | Admitting: Internal Medicine

## 2016-04-10 DIAGNOSIS — R1310 Dysphagia, unspecified: Secondary | ICD-10-CM

## 2016-08-06 ENCOUNTER — Other Ambulatory Visit (HOSPITAL_COMMUNITY): Payer: Self-pay | Admitting: Urology

## 2016-08-06 DIAGNOSIS — C61 Malignant neoplasm of prostate: Secondary | ICD-10-CM

## 2016-08-19 ENCOUNTER — Encounter (HOSPITAL_COMMUNITY)
Admission: RE | Admit: 2016-08-19 | Discharge: 2016-08-19 | Disposition: A | Discharge status: Home / Self Care | Payer: Medicare Other | Source: Ambulatory Visit | Attending: Urology | Admitting: Urology

## 2016-08-19 DIAGNOSIS — C61 Malignant neoplasm of prostate: Secondary | ICD-10-CM | POA: Insufficient documentation

## 2016-08-19 MED ORDER — TECHNETIUM TC 99M MEDRONATE IV KIT: 25.0000 | PACK | Freq: Once | INTRAVENOUS | Status: DC | PRN

## 2017-04-07 ENCOUNTER — Emergency Department (HOSPITAL_COMMUNITY): Payer: Medicare Other

## 2017-04-07 ENCOUNTER — Emergency Department (HOSPITAL_COMMUNITY)
Admission: EM | Admit: 2017-04-07 | Discharge: 2017-04-07 | Disposition: A | Discharge status: Home / Self Care | Payer: Medicare Other | Attending: Emergency Medicine | Admitting: Emergency Medicine

## 2017-04-07 ENCOUNTER — Encounter (HOSPITAL_COMMUNITY): Payer: Self-pay | Admitting: Emergency Medicine

## 2017-04-07 DIAGNOSIS — Z79899 Other long term (current) drug therapy: Secondary | ICD-10-CM | POA: Diagnosis not present

## 2017-04-07 DIAGNOSIS — Z7982 Long term (current) use of aspirin: Secondary | ICD-10-CM | POA: Diagnosis not present

## 2017-04-07 DIAGNOSIS — R200 Anesthesia of skin: Secondary | ICD-10-CM | POA: Insufficient documentation

## 2017-04-07 DIAGNOSIS — M6281 Muscle weakness (generalized): Secondary | ICD-10-CM | POA: Diagnosis not present

## 2017-04-07 DIAGNOSIS — Z8546 Personal history of malignant neoplasm of prostate: Secondary | ICD-10-CM | POA: Insufficient documentation

## 2017-04-07 DIAGNOSIS — E039 Hypothyroidism, unspecified: Secondary | ICD-10-CM | POA: Insufficient documentation

## 2017-04-07 DIAGNOSIS — Z87891 Personal history of nicotine dependence: Secondary | ICD-10-CM | POA: Diagnosis not present

## 2017-04-07 DIAGNOSIS — W19XXXA Unspecified fall, initial encounter: Secondary | ICD-10-CM | POA: Diagnosis not present

## 2017-04-07 DIAGNOSIS — R079 Chest pain, unspecified: Secondary | ICD-10-CM | POA: Diagnosis not present

## 2017-04-07 DIAGNOSIS — Z96653 Presence of artificial knee joint, bilateral: Secondary | ICD-10-CM | POA: Insufficient documentation

## 2017-04-07 DIAGNOSIS — Z85828 Personal history of other malignant neoplasm of skin: Secondary | ICD-10-CM | POA: Insufficient documentation

## 2017-04-07 DIAGNOSIS — R05 Cough: Secondary | ICD-10-CM | POA: Diagnosis not present

## 2017-04-07 HISTORY — DX: Malignant neoplasm of prostate: C61

## 2017-04-07 LAB — BASIC METABOLIC PANEL
Anion gap: 9 (ref 5–15)
BUN: 15 mg/dL (ref 6–20)
CO2: 28 mmol/L (ref 22–32)
Calcium: 9.2 mg/dL (ref 8.9–10.3)
Chloride: 102 mmol/L (ref 101–111)
Creatinine, Ser: 1.27 mg/dL — ABNORMAL HIGH (ref 0.61–1.24)
GFR calc Af Amer: 56 mL/min — ABNORMAL LOW (ref >60)
GFR, EST NON AFRICAN AMERICAN: 48 mL/min — AB (ref >60)
GLUCOSE: 129 mg/dL — AB (ref 65–99)
POTASSIUM: 3.8 mmol/L (ref 3.5–5.1)
Sodium: 139 mmol/L (ref 135–145)

## 2017-04-07 LAB — CBC
HEMATOCRIT: 37.3 % — AB (ref 39.0–52.0)
Hemoglobin: 12.3 g/dL — ABNORMAL LOW (ref 13.0–17.0)
MCH: 29.8 pg (ref 26.0–34.0)
MCHC: 33 g/dL (ref 30.0–36.0)
MCV: 90.3 fL (ref 78.0–100.0)
Platelets: 195 10*3/uL (ref 150–400)
RBC: 4.13 MIL/uL — ABNORMAL LOW (ref 4.22–5.81)
RDW: 13.9 % (ref 11.5–15.5)
WBC: 4.1 10*3/uL (ref 4.0–10.5)

## 2017-04-07 LAB — DIFFERENTIAL
BASOS PCT: 1 %
Basophils Absolute: 0.1 10*3/uL (ref 0.0–0.1)
EOS ABS: 0.3 10*3/uL (ref 0.0–0.7)
Eosinophils Relative: 6 %
Lymphocytes Relative: 33 %
Lymphs Abs: 1.3 10*3/uL (ref 0.7–4.0)
Monocytes Absolute: 0.2 10*3/uL (ref 0.1–1.0)
Monocytes Relative: 5 %
NEUTROS PCT: 55 %
Neutro Abs: 2.2 10*3/uL (ref 1.7–7.7)

## 2017-04-07 LAB — HEPATIC FUNCTION PANEL
ALK PHOS: 95 U/L (ref 38–126)
ALT: 64 U/L — ABNORMAL HIGH (ref 17–63)
AST: 51 U/L — ABNORMAL HIGH (ref 15–41)
Albumin: 3.7 g/dL (ref 3.5–5.0)
BILIRUBIN DIRECT: 0.1 mg/dL (ref 0.1–0.5)
Indirect Bilirubin: 0.3 mg/dL (ref 0.3–0.9)
TOTAL PROTEIN: 6.6 g/dL (ref 6.5–8.1)
Total Bilirubin: 0.4 mg/dL (ref 0.3–1.2)

## 2017-04-07 LAB — TROPONIN I: Troponin I: <0.03 ng/mL (ref <0.03)

## 2017-04-07 NOTE — Discharge Instructions (Signed)
Follow up with dr. Christella Noa this week.  Call tomorrow to get an appointment.  Follow up with your md for your cough and pain if not improving.

## 2017-04-07 NOTE — ED Provider Notes (Signed)
[facility] EMERGENCY DEPARTMENT Provider Note   CSN: [id number] Arrival date & time: 04/07/17  1525     History   Chief Complaint Chief Complaint  Patient presents with  . Fall    also complaining of chest pain    HPI Evan Miller is a 83 y.o. male.  Patient was brought to the emergency department because for last couple weeks his right leg becomes numb and he falls to the floor.  This is happened 4 or 5 times.  Patient does not use a walker.  He also states he has had a mild cough and occasional chest discomfort.   The history is provided by the patient.  Fall  This is a new problem. The current episode started more than 1 week ago. The problem occurs every several days. The problem has been resolved. Associated symptoms include chest pain. Pertinent negatives include no abdominal pain and no headaches. Nothing aggravates the symptoms. Nothing relieves the symptoms. He has tried nothing for the symptoms. The treatment provided no relief.    Past Medical History:  Diagnosis Date  . Arthritis    back,shoulders and hips  . Cancer ([place])    basal cell/ Melanoma 1997 left  shoulder/  PROSTATE  . GERD (gastroesophageal reflux disease)   . Hepatitis    age 13 years  . Hypothyroidism   . PONV (postoperative nausea and vomiting)   . Prostate CA [facility])     Patient Active Problem List   Diagnosis Date Noted  . Dysphagia 09/27/2015  . History of colonic polyps 07/28/2012  . Left sided abdominal pain 07/28/2012  . Hypothyroidism 07/28/2012  . OA (osteoarthritis) of knee 06/24/2011    Past Surgical History:  Procedure Laterality Date  . CATARACT EXTRACTION W/PHACO  12/16/2011   Procedure: CATARACT EXTRACTION PHACO AND INTRAOCULAR LENS PLACEMENT (IOC);  Surgeon: Tonny Branch, MD;  Location: [facility];  Service: Ophthalmology;  Laterality: Left;  CDE:17.24   . CATARACT EXTRACTION W/PHACO  12/30/2011   Procedure: CATARACT EXTRACTION PHACO AND INTRAOCULAR LENS PLACEMENT (IOC);   Surgeon: Tonny Branch, MD;  Location: [facility];  Service: Ophthalmology;  Laterality: Right;  CDE 15.40  . COLONOSCOPY    . COLONOSCOPY N/A 08/07/2012   Procedure: COLONOSCOPY;  Surgeon: Rogene Houston, MD;  Location: [facility];  Service: Endoscopy;  Laterality: N/A;  10:00  . COLONOSCOPY N/A 11/22/2015   Procedure: COLONOSCOPY;  Surgeon: Rogene Houston, MD;  Location: [facility];  Service: Endoscopy;  Laterality: N/A;  12:00 - moved to 10:30 - Ann to notify  . ESOPHAGEAL DILATION N/A 02/10/2015   Procedure: ESOPHAGEAL DILATION;  Surgeon: Rogene Houston, MD;  Location: [facility];  Service: Endoscopy;  Laterality: N/A;  . ESOPHAGEAL DILATION N/A 03/29/2015   Procedure: ESOPHAGEAL DILATION;  Surgeon: Rogene Houston, MD;  Location: [facility];  Service: Endoscopy;  Laterality: N/A;  . ESOPHAGEAL DILATION  11/22/2015   Procedure: ESOPHAGEAL DILATION;  Surgeon: Rogene Houston, MD;  Location: [facility];  Service: Endoscopy;;  . ESOPHAGOGASTRODUODENOSCOPY N/A 02/10/2015   Procedure: ESOPHAGOGASTRODUODENOSCOPY (EGD);  Surgeon: Rogene Houston, MD;  Location: [facility];  Service: Endoscopy;  Laterality: N/A;  925 - moved to 10:20 - Ann to notify pt  . ESOPHAGOGASTRODUODENOSCOPY N/A 03/29/2015   Procedure: ESOPHAGOGASTRODUODENOSCOPY (EGD);  Surgeon: Rogene Houston, MD;  Location: [facility];  Service: Endoscopy;  Laterality: N/A;  145 - moved to 8:30 - Ann notified pt  . ESOPHAGOGASTRODUODENOSCOPY N/A  11/22/2015   Procedure: ESOPHAGOGASTRODUODENOSCOPY (EGD);  Surgeon: Rogene Houston, MD;  Location: [facility];  Service: Endoscopy;  Laterality: N/A;  . HERNIA REPAIR    . HYDROCELE EXCISION     bilateral  . JOINT REPLACEMENT     left knee 9/12  . TOTAL KNEE ARTHROPLASTY  06/24/2011   Procedure: TOTAL KNEE ARTHROPLASTY;  Surgeon: Gearlean Alf;  Location: [facility];  Service: Orthopedics;  Laterality: Right;       Home Medications    Prior to Admission medications     Medication Sig Start Date End Date Taking? Authorizing Provider  aspirin 81 MG tablet Take 1 tablet (81 mg total) by mouth daily. 11/27/15   Rehman, Mechele Dawley, MD  levothyroxine (SYNTHROID, LEVOTHROID) 100 MCG tablet Take 100 mcg by mouth daily before breakfast.     [provider]  pantoprazole (PROTONIX) 40 MG tablet take 1 tablet once daily 04/11/16   Setzer, Terri L, NP  polyethylene glycol-electrolytes (NULYTELY/GOLYTELY) 420 g solution Take 4,000 mLs by mouth once. 10/03/15   Setzer, Rona Ravens, NP  SSD 1 % cream  02/26/17   [provider]    Family History Family History  Problem Relation Age of Onset  . Breast cancer Mother   . Prostate cancer Brother   . Nephritis Brother   . Rectal cancer Brother   . Cirrhosis Brother   . Liver cancer Brother   . Healthy Daughter   . Healthy Son     Social History Social History  Substance Use Topics  . Smoking status: Former Smoker    Packs/day: 1.00    Years: 30.00    Types: Cigarettes    Quit date: 06/16/1962  . Smokeless tobacco: Former Systems developer    Types: Chew    Quit date: 06/16/2006  . Alcohol use No     Comment: quit  60 yrs ago     Allergies   Penicillins and Sulfa antibiotics   Review of Systems Review of Systems  Constitutional: Negative for appetite change and fatigue.  HENT: Negative for congestion, ear discharge and sinus pressure.   Eyes: Negative for discharge.  Respiratory: Negative for cough.   Cardiovascular: Positive for chest pain.  Gastrointestinal: Negative for abdominal pain and diarrhea.  Genitourinary: Negative for frequency and hematuria.  Musculoskeletal: Negative for back pain.  Skin: Negative for rash.  Neurological: Negative for seizures and headaches.       Numbness and weakness right leg  Psychiatric/Behavioral: Negative for hallucinations.     Physical Exam Updated Vital Signs BP 137/66   Pulse 64   Temp 97.8 F (36.6 C) (Oral)   Resp 13   SpO2 99%   Physical Exam   Constitutional: He is oriented to person, place, and time. He appears well-developed.  HENT:  Head: Normocephalic.  Eyes: Conjunctivae and EOM are normal. No scleral icterus.  Neck: Neck supple. No thyromegaly present.  Cardiovascular: Normal rate and regular rhythm.  Exam reveals no gallop and no friction rub.   No murmur heard. Pulmonary/Chest: No stridor. He has no wheezes. He has no rales. He exhibits no tenderness.  Abdominal: He exhibits no distension. There is no tenderness. There is no rebound.  Genitourinary:  Genitourinary Comments: Normal rectal tone  Musculoskeletal: Normal range of motion. He exhibits no edema.  Normal strength.  Patient would not relax enough to do reflexes.  Lymphadenopathy:    He has no cervical adenopathy.  Neurological: He is oriented to person, place, and time.  He exhibits normal muscle tone. Coordination normal.  Skin: No rash noted. No erythema.  Psychiatric: He has a normal mood and affect. His behavior is normal.     ED Treatments / Results  Labs (all labs ordered are listed, but only abnormal results are displayed) Labs Reviewed  BASIC METABOLIC PANEL - Abnormal; Notable for the following:       Result Value   Glucose, Bld 129 (*)    Creatinine, Ser 1.27 (*)    GFR calc non Af Amer 48 (*)    GFR calc Af Amer 56 (*)    All other components within normal limits  CBC - Abnormal; Notable for the following:    RBC 4.13 (*)    Hemoglobin 12.3 (*)    HCT 37.3 (*)    All other components within normal limits  HEPATIC FUNCTION PANEL - Abnormal; Notable for the following:    AST 51 (*)    ALT 64 (*)    All other components within normal limits  TROPONIN I  DIFFERENTIAL    EKG  EKG Interpretation  Date/Time:  Monday April 07 2017 15:50:32 EDT Ventricular Rate:  62 PR Interval:  182 QRS Duration: 92 QT Interval:  440 QTC Calculation: 446 R Axis:   -38 Text Interpretation:  Sinus rhythm with Premature atrial complexes Left axis  deviation Left ventricular hypertrophy Abnormal ECG Confirmed by Milton Ferguson [phone]) on 04/07/2017 7:13:46 PM       Radiology Dg Chest 2 View  Result Date: 04/07/2017 CLINICAL DATA:  Left-sided chest pain, 2-3 days duration. EXAM: CHEST  2 VIEW COMPARISON:  01/09/2006 FINDINGS: Heart size is normal. Chronic aortic atherosclerosis. The pulmonary vascularity is normal. The lungs are clear. No effusions. Chronic degenerative changes affect the spine. IMPRESSION: No active disease.  Aortic atherosclerosis. Electronically Signed   By: Nelson Chimes M.D.   On: 04/07/2017 16:29   Dg Lumbar Spine Complete  Result Date: 04/07/2017 CLINICAL DATA:  Frequent falls, prostate cancer EXAM: LUMBAR SPINE - COMPLETE 4+ VIEW COMPARISON:  Concurrent MRI of lumbar spine. Prior whole-body bone scan dated 08/19/2016. FINDINGS: Five lumbar-type vertebral bodies. Normal lumbar lordosis.  Mild mid lumbar dextroscoliosis. No evidence of fracture or dislocation. Vertebral body heights are essentially maintained. Moderate multilevel degenerative changes, most prominent at T12-L1 and L4-5. Visualized bony pelvis appears intact. IMPRESSION: No fracture or dislocation is seen. Moderate degenerative changes, as above. Electronically Signed   By: Julian Hy M.D.   On: 04/07/2017 18:09   Mr Lumbar Spine Wo Contrast  Result Date: 04/07/2017 CLINICAL DATA:  Back pain.  Frequent falls. EXAM: MRI LUMBAR SPINE WITHOUT CONTRAST TECHNIQUE: Multiplanar, multisequence MR imaging of the lumbar spine was performed. No intravenous contrast was administered. COMPARISON:  None. FINDINGS: Segmentation:  Standard. Alignment:  Grade 1 retrolisthesis at L2-L3. Vertebrae:  No fracture, evidence of discitis, or bone lesion. Conus medullaris: Extends to the L1 level and appears normal. Paraspinal and other soft tissues: Negative. Disc levels: Above the T12 level, there is no disc herniation, spinal canal stenosis or neuroforaminal stenosis.  T12-L1: Diffuse disc bulge with narrowing of the thecal sac. Mild bilateral neural foraminal stenosis and mild spinal canal stenosis. L1-L2: Disc space narrowing with small disc osteophyte complex. No central spinal canal stenosis. Moderate right and mild left neural foraminal stenosis. L2-L3: Posterior disc osteophyte complex and facet hypertrophy with narrowing of the left lateral recess. No central spinal canal stenosis. Mild right and severe left neural foraminal stenosis. L3-L4: Severe disc space  loss. Disc osteophyte complex and facet hypertrophy narrows the left lateral recess and causes moderate to severe left neural foraminal stenosis. L4-L5: Severe disc space narrowing with severe facet hypertrophy large disc bulge. The thecal sac is completely effaced. There is compression of the cauda equina nerve roots. There is severe stenosis of both neural foramina. L5-S1: Severe narrowing of the disc space with small disc osteophyte complex. No central spinal canal stenosis. Moderate right and mild left neural foraminal stenosis. Visualized sacrum: Normal. IMPRESSION: 1. Compression of the cauda equina nerve roots at the L4-L5 level due to combination of severe facet arthrosis and large disc bulge. This finding is consistent with cauda equina syndrome in the matching clinical context. 2. Severe lumbar degenerative disc disease with moderate to severe neural foraminal stenosis at multiple levels. Critical Value/emergent results were called by telephone at the time of interpretation on 04/07/2017 at 6:12 pm to Dr. Milton Ferguson , who verbally acknowledged these results. Electronically Signed   By: Ulyses Jarred M.D.   On: 04/07/2017 18:13    Procedures Procedures (including critical care time)  Medications Ordered in ED Medications - No data to display   Initial Impression / Assessment and Plan / ED Course  I have reviewed the triage vital signs and the nursing notes.  Pertinent labs & imaging results  that were available during my care of the patient were reviewed by me and considered in my medical decision making (see chart for details).   Labs unremarkable.  Patient without any chest discomfort in the emergency department.  MRI shows possible cauda equina.  Patient does not have any loss of bowel or bladder.  He is able to ambulate in the emergency  department with any problems.  I spoke with neurosurgery Dr.Cabbel and he states he will follow-up the patient is office this week.  The patient is going to continue to use a walker to ambulate with and follow-up with his family doctor if his cough does not improve    Final Clinical Impressions(s) / ED Diagnoses   Final diagnoses:  Fall, initial encounter    New Prescriptions New Prescriptions   No medications on file     Milton Ferguson, MD 04/07/17 1941

## 2017-04-07 NOTE — ED Triage Notes (Signed)
Pt sent from PCP office for evaluation of frequent falls. Pt states right leg gives out and he falls. Today states he feels like he has a cold. Also complaining of dull left side chest pain.

## 2017-04-22 ENCOUNTER — Ambulatory Visit (HOSPITAL_COMMUNITY): Payer: Medicare Other | Attending: Neurosurgery | Admitting: Physical Therapy

## 2017-04-22 ENCOUNTER — Encounter (HOSPITAL_COMMUNITY): Payer: Self-pay | Admitting: Physical Therapy

## 2017-04-22 ENCOUNTER — Other Ambulatory Visit: Payer: Self-pay

## 2017-04-22 DIAGNOSIS — M6281 Muscle weakness (generalized): Secondary | ICD-10-CM

## 2017-04-22 DIAGNOSIS — R2689 Other abnormalities of gait and mobility: Secondary | ICD-10-CM | POA: Insufficient documentation

## 2017-04-22 DIAGNOSIS — R2991 Unspecified symptoms and signs involving the musculoskeletal system: Secondary | ICD-10-CM | POA: Insufficient documentation

## 2017-04-22 NOTE — Therapy (Signed)
[place], [place], [address] Phone: [phone]   Fax:  [phone]  Physical Therapy Evaluation  Patient Details  Name: Evan Miller MRN: [id number] Date of Birth: 01-28-1926 Referring Provider: Ashok Pall, MD   Encounter Date: 04/22/2017  PT End of Session - 04/22/17 1332    Visit Number  1    Number of Visits  17    Date for PT Re-Evaluation  05/20/17    Authorization Type  [employer] Medicare    Authorization Time Period  04/22/17-06/17/17    Authorization - Visit Number  1    Authorization - Number of Visits  17    PT Start Time  1118    PT Stop Time  1159    PT Time Calculation (min)  41 min    Equipment Utilized During Treatment  -- rollator    Activity Tolerance  Patient tolerated treatment well    Behavior During Therapy  [facility] for tasks assessed/performed       Past Medical History:  Diagnosis Date  . Arthritis    back,shoulders and hips  . Cancer ([place])    basal cell/ Melanoma 1997 left  shoulder/  PROSTATE  . GERD (gastroesophageal reflux disease)   . Hepatitis    age 42 years  . Hypothyroidism   . PONV (postoperative nausea and vomiting)   . Prostate CA [facility])     Past Surgical History:  Procedure Laterality Date  . COLONOSCOPY    . HERNIA REPAIR    . HYDROCELE EXCISION     bilateral  . JOINT REPLACEMENT     left knee 9/12    There were no vitals filed for this visit.   Subjective Assessment - 04/22/17 1318    Subjective  Patient is feeling well overall today and has a pleasant and positive attitude with therapist. Patient reports he has been ambulating with his rollator for ~2 weeks since his last visit with his doctor. He has not fallen since his ED visit ~ 3 weeks ago after he had 3-4 falls in one day. He states he regularly exercises at home, demosntarting some seated LE strengthening and states he is an active person. 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    Patient is accompained by:  -- friend Clare Gandy)    How long can you sit comfortably?  as long as I want    How long can you stand comfortably?  at least 30 minutes as long as I have something to hold onto    How long can you walk comfortably?  walk around my house, 100 feet    Diagnostic tests  Had an MRI on lumbar region, MD discussed that he has disc degeneration    Patient Stated Goals  I'd like to get moving more and be able to walk like I used to.    Currently in Pain?  No/denies         [facility] PT Assessment - 04/22/17 0001      Assessment   Medical Diagnosis  Radiculopathy, lumbar region     Referring Provider  Ashok Pall, MD    Prior Therapy  Had therapy for bilatearl TKA several years ago      Precautions   Precautions  Fall    Precaution Comments  ambulation with rollator      Restrictions  Weight Bearing Restrictions  No      Balance Screen   Has the patient fallen in the past 6 months  Yes    How many times?  3    Has the patient had a decrease in activity level because of a fear of falling?   Yes    Is the patient reluctant to leave their home because of a fear of falling?   No      Home Environment   Living Environment  Private residence    Living Arrangements  Spouse/significant other    [place] to enter    Entrance Stairs-Number of Steps  2    Entrance Stairs-Rails  Can reach both    [place]  Two level    Additional Comments  patient has stairs inside of home      Prior Function   Level of Independence  Independent;Independent with basic ADLs    Vocation  Retired    Leisure  Enjoys fishing and going to bingo every Tuesday with his friend Clare Gandy. 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       Cognition   Overall Cognitive Status  Within Functional Limits for tasks assessed    Memory  --    Awareness   --    Problem Solving  --    Executive Function  --    Sequencing  --      Observation/Other Assessments   Observations  Patient is hard of hearing making it difficult to assess sequencing and following commands throughout some objective assessments      Sensation   Additional Comments  Patient notes tingling and numbess in RLE  anlong anterolateral thigh      Functional Tests   Functional tests  Single leg stance      Single Leg Stance   Comments  patient requires external support to prevent LOB with SLS on RLE, patient able to perform SLS on LLE for 1 second. Patient holding BUE in low guard position and using for postural equilibrium reactions       Posture/Postural Control   Posture/Postural Control  Postural limitations    Postural Limitations  Rounded Shoulders;Forward head;Decreased lumbar lordosis;Increased thoracic kyphosis;Posterior pelvic tilt;Flexed trunk      ROM / Strength   AROM / PROM / Strength  Strength      Strength   Overall Strength Comments  BUE are WFL thoughuot with exception to R shulder abduction    Strength Assessment Site  Hip;Knee;Ankle    Right/Left Hip  Right;Left    Right Hip Flexion  4/5    Right Hip Extension  -- unable to perform due to muscle length deficits    Right Hip ABduction  3-/5    Left Hip Flexion  4/5    Left Hip Extension  -- unable to perform due to muscle length deficits    Left Hip ABduction  3-/5    Right/Left Knee  Right;Left    Right Knee Flexion  4+/5    Right Knee Extension  4+/5    Left Knee Flexion  4+/5    Left Knee Extension  4+/5    Right/Left Ankle  Right;Left    Right Ankle Dorsiflexion  5/5    Right Ankle Plantar Flexion  4+/5    Left Ankle Dorsiflexion  5/5    Left Ankle Plantar Flexion  4+/5      Flexibility   Soft Tissue Assessment /Muscle Length  yes    Quadriceps  positive findings with thomas test bilaterally. Patient with >50% shortening of hip flexors, ilopsoas, TFL, and quadriceps       Ambulation/Gait   Ambulation/Gait  Yes    Ambulation/Gait Assistance  6: Modified independent (Device/Increase time)    Ambulation Distance (Feet)  354 Feet    Assistive device  Rollator    Gait Pattern  Decreased stride length;Decreased hip/knee flexion - right;Shuffle;Trunk flexed;Poor foot clearance - left;Poor foot clearance - right;Decreased trunk rotation;Decreased step length - left;Decreased step length - right    Ambulation Surface  Level      6 Minute Walk- Baseline   6 Minute Walk- Baseline  yes      6 minute walk test results    Aerobic Endurance Distance Walked  354    Endurance additional comments  wiht rollator (74min walk)      Balance   Balance Assessed  Yes      Static Standing Balance   Static Standing - Balance Support  No upper extremity supported    Static Standing - Level of Assistance  5: Stand by assistance;4: Min assist    Static Standing Balance -  Activities   Single Leg Stance - Right Leg;Single Leg Stance - Left Leg;Romberg - Eyes Opened;Romberg - Eyes Closed    Static Standing - Comment/# of Minutes  Patient ablet o maintain postural control and midline for 30 seconds with Romberg eyes open and closed. Patient unable to perfomr SLS on BLE requiring Minimum assistance and external support to prevent LOB. Patietn with greater LOB during R SLS.       Objective measurements completed on examination: See above findings.      PT Education - 04/22/17 1327    Education provided  Yes    Education Details  Educated on role of Community education officer, discussed goals and prognosis    Person(s) Educated  Patient;Other (comment)    Methods  Explanation    Comprehension  Verbalized understanding       PT Short Term Goals - 04/22/17 1352      PT SHORT TERM GOAL #1   Title  Patient will be independent with HEP to improve balance and LE muscular strength to improve functional mobility.    Time  2    Period  Weeks    Status  New    Target Date  05/06/17      PT  SHORT TERM GOAL #2   Title  Patient will have improved MMT for BLE 1/2 grade to improve functional strength to progress functional independence.     Time  4    Period  Weeks    Status  New    Target Date  05/20/17      PT SHORT TERM GOAL #3   Title  Patient will improve SLS to greater than or equal to 5 seconds bilaterally to fall within age predicted norms for an 81 year old male and decrease fall risk.    Baseline  0 seconds    Time  4    Period  Weeks    Status  New      PT SHORT TERM GOAL #4   Title  Patient will perform the TUG in 14 seconds or less to decrease fall risk and progress towards age based norms for 63 year olds.    Time  8    Period  Weeks    Status  New        PT Long Term Goals - 04/22/17 1359      PT LONG TERM GOAL #1   Title  Patient will have improved MMT for BLE 1 grade to improve functional strength to progress functional independence.     Time  8    Period  Weeks    Status  New    Target Date  06/17/17      PT LONG TERM GOAL #2   Title  Patient will ambulate 100 feet greater during 3 minute walk test to demonstrate significant improvement in aerobic walking endurance.    Time  8    Period  Weeks    Status  New      PT LONG TERM GOAL #3   Title  Patient will perform the five time sit to stand in 14 seconds or less to decrease fall risk and achieve age based norms for 77 year olds.    Time  8    Period  Weeks    Status  New      PT LONG TERM GOAL #4   Title  Patient will perform the TUG in 10 seconds or less to decrease fall risk and achieve age based norms for 75 year olds.    Time  8    Period  Weeks    Status  New             Plan - 04/22/17 1334    Clinical Impression Statement  Mr. Arquette presents for initial physical therapy evaluation with a referral for lumbar radiculopathy. He currently is functioning below his reported PLOF of independent to ambulate with no device. He now requires a rollator for gait and functional  mobility and reports a feeling of unsteadiness and fear of falling without his rollator. Patient was found to have fatiguable weakness of right hip flexion and reported sensation deficits along his right anterolateral thigh, these findings are consistant with and L2 lumbar radiculopathy and PT will continue to assess. Objective and functional testing found the following impairments: muscular weakness, impaired flexibility secondary to decreased muscle length, impaired balance, poor postural and righting strategies, and abnormal gait patterns. Mr. Bridgewater will benefit from skilled PT services to address the above impairments and improve functinoal mobility and QOL.    History and Personal Factors relevant to plan of care:  Patient has recently had onset of numbess and tigling in right anterolateral thigh, he experienced 3-4 fall roughly 3 weeks ago which resulted in and ED visit. 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    Clinical Presentation  Stable    Clinical Presentation due to:  muscular weakness, balance deficits, gait abnormalities, posutral impairments, sensation deficits    Clinical Decision Making  Low    Rehab Potential  Fair    PT Frequency  2x / week    PT Duration  8 weeks    PT Treatment/Interventions  ADLs/Self Care Home Management;Gait training;Stair training;Neuromuscular re-education;Functional mobility training;Moist Heat;Cryotherapy;DME Instruction;Balance training;Therapeutic exercise;Therapeutic activities;Patient/family education;Manual techniques;Passive range of motion;Energy conservation;Taping    PT Next Visit Plan  review goals and initiate functional strengthening for hip flexors, extensors, and abductors. Patient will benefit from 5x S<>S and TUG.    PT Home Exercise Plan  initiate on first treatment,: hip flexor/quad stretch and balance activities    Consulted and Agree with Plan of Care  Patient;Family member/caregiver    Family Member Consulted  patient's friend  "Clare Gandy"       Patient will benefit from skilled therapeutic intervention in order to improve the following deficits and impairments:  Abnormal gait, Decreased knowledge of use of DME, Impaired sensation, Improper body mechanics, Decreased mobility, Postural dysfunction, Decreased activity tolerance, Decreased range of motion, Decreased strength, Decreased balance, Difficulty walking, Impaired flexibility  Visit Diagnosis: Other abnormalities of gait and mobility  Muscle weakness (generalized)  Musculoskeletal abnormal finding on examination  G-Codes - 2017/05/03 1416    Functional Assessment Tool Used (Outpatient Only)  Clinical judgement and measures    Functional Limitation  Mobility: Walking and moving around    Mobility: Walking and Moving Around Current Status [phone])  At least 40 percent but less than 60 percent impaired, limited or restricted    Mobility: Walking and Moving Around Goal Status [phone])  At least 20 percent but less than 40 percent impaired, limited or restricted        Problem List Patient Active Problem List   Diagnosis Date Noted  . Dysphagia 09/27/2015  . History of colonic polyps 07/28/2012  . Left sided abdominal pain 07/28/2012  . Hypothyroidism 07/28/2012  . OA (osteoarthritis) of knee 06/24/2011    Debara Pickett, PT, DPT Physical Therapist with [facility]  05-03-17 2:29 PM     [place], [place], [address] Phone: [phone]   Fax:  [phone]  Name: Evan Miller MRN: [id number] Date of Birth: [birth year]

## 2017-04-25 ENCOUNTER — Ambulatory Visit (HOSPITAL_COMMUNITY): Payer: Medicare Other | Admitting: Physical Therapy

## 2017-04-25 ENCOUNTER — Other Ambulatory Visit: Payer: Self-pay

## 2017-04-25 ENCOUNTER — Encounter (HOSPITAL_COMMUNITY): Payer: Self-pay | Admitting: Physical Therapy

## 2017-04-25 DIAGNOSIS — R2991 Unspecified symptoms and signs involving the musculoskeletal system: Secondary | ICD-10-CM

## 2017-04-25 DIAGNOSIS — M6281 Muscle weakness (generalized): Secondary | ICD-10-CM

## 2017-04-25 DIAGNOSIS — R2689 Other abnormalities of gait and mobility: Secondary | ICD-10-CM

## 2017-04-25 NOTE — Therapy (Signed)
[place], [place], [address] Phone: [phone]   Fax:  [phone]  Physical Therapy Treatment  Patient Details  Name: Evan Miller MRN: [id number] Date of Birth: [birth year] Referring Provider: Ashok Pall, MD   Encounter Date: 04/25/2017  PT End of Session - 04/25/17 1151    Visit Number  2    Number of Visits  17    Date for PT Re-Evaluation  05/20/17    Authorization Type  [employer] Medicare    Authorization Time Period  04/22/17-06/17/17    Authorization - Visit Number  2    Authorization - Number of Visits  17    PT Start Time  1120    PT Stop Time  1200    PT Time Calculation (min)  40 min    Equipment Utilized During Treatment  -- rollator    Activity Tolerance  Patient tolerated treatment well    Behavior During Therapy  [facility] for tasks assessed/performed       Past Medical History:  Diagnosis Date  . Arthritis    back,shoulders and hips  . Cancer ([place])    basal cell/ Melanoma 1997 left  shoulder/  PROSTATE  . GERD (gastroesophageal reflux disease)   . Hepatitis    age 70 years  . Hypothyroidism   . PONV (postoperative nausea and vomiting)   . Prostate CA [facility])     Past Surgical History:  Procedure Laterality Date  . CATARACT EXTRACTION PHACO AND INTRAOCULAR LENS PLACEMENT ([place]) Right 12/30/2011   Performed by Tonny Branch, MD at [facility]  . CATARACT EXTRACTION PHACO AND INTRAOCULAR LENS PLACEMENT ([place]) Left 12/16/2011   Performed by Tonny Branch, MD at [facility]  . COLONOSCOPY    . COLONOSCOPY N/A 11/22/2015   Performed by Rogene Houston, MD at [place]  . COLONOSCOPY N/A 08/07/2012   Performed by Rogene Houston, MD at [place]  11/22/2015   Performed by Rogene Houston, MD at [place] N/A 03/29/2015   Performed by Rogene Houston, MD at [place] N/A 02/10/2015   Performed by Rogene Houston, MD at [place]  . ESOPHAGOGASTRODUODENOSCOPY (EGD) N/A 11/22/2015   Performed by Rogene Houston, MD at [place]  . ESOPHAGOGASTRODUODENOSCOPY (EGD) N/A 03/29/2015   Performed by Rogene Houston, MD at [place]  . ESOPHAGOGASTRODUODENOSCOPY (EGD) N/A 02/10/2015   Performed by Rogene Houston, MD at [place]  . HERNIA REPAIR    . HYDROCELE EXCISION     bilateral  . JOINT REPLACEMENT     left knee 9/12  . TOTAL KNEE ARTHROPLASTY Right 06/24/2011   Performed by Gearlean Alf, MD at [facility]    There were no vitals filed for this visit.  Subjective Assessment - 04/25/17 1119    Subjective  PT states that he was not given any exercises to do at home     Patient is accompained by:  -- friend Clare Gandy)    How long can you sit comfortably?  as long as I want    How long can you stand comfortably?  at least 30 minutes as long as I have something to hold onto    How long can you walk comfortably?  walk around my house, 100 feet    Diagnostic tests  Had an MRI  on lumbar region, MD discussed that he has disc degeneration    Patient Stated Goals  I'd like to get moving more and be able to walk like I used to.    Currently in Pain?  No/denies         [facility] PT Assessment - 04/25/17 0001      Functional Tests   Functional tests  Sit to Stand      Sit to Stand   Comments  with use of UE 5 x 35.64      Standardized Balance Assessment   Standardized Balance Assessment  Timed Up and Go Test      Timed Up and Go Test   Normal TUG (seconds)  25.45                  [facility] Adult PT Treatment/Exercise - 04/25/17 0001      Exercises   Exercises  Lumbar      Lumbar Exercises: Stretches   Active Hamstring Stretch  3 reps;30 seconds      Lumbar Exercises: Seated   Other Seated Lumbar Exercises  cervical and scapular retraction x 20; tall posture x 10       Lumbar Exercises: Supine   Ab Set  5 reps    Bridge  10 reps    Other Supine Lumbar Exercises  decompression  exercises 1-5                PT Short Term Goals - 04/22/17 1352      PT SHORT TERM GOAL #1   Title  Patient will be independent with HEP to improve balance and LE muscular strength to improve functional mobility.    Time  2    Period  Weeks    Status  New    Target Date  05/06/17      PT SHORT TERM GOAL #2   Title  Patient will have improved MMT for BLE 1/2 grade to improve functional strength to progress functional independence.     Time  4    Period  Weeks    Status  New    Target Date  05/20/17      PT SHORT TERM GOAL #3   Title  Patient will improve SLS to greater than or equal to 5 seconds bilaterally to fall within age predicted norms for an 81 year old male and decrease fall risk.    Baseline  0 seconds    Time  4    Period  Weeks    Status  New      PT SHORT TERM GOAL #4   Title  Patient will perform the TUG in 14 seconds or less to decrease fall risk and progress towards age based norms for 93 year olds.    Time  8    Period  Weeks    Status  New        PT Long Term Goals - 04/22/17 1359      PT LONG TERM GOAL #1   Title  Patient will have improved MMT for BLE 1 grade to improve functional strength to progress functional independence.     Time  8    Period  Weeks    Status  New    Target Date  06/17/17      PT LONG TERM GOAL #2   Title  Patient will ambulate 100 feet greater during 3 minute walk test to demonstrate significant improvement in aerobic walking endurance.  Time  8    Period  Weeks    Status  New      PT LONG TERM GOAL #3   Title  Patient will perform the five time sit to stand in 14 seconds or less to decrease fall risk and achieve age based norms for 58 year olds.    Time  8    Period  Weeks    Status  New      PT LONG TERM GOAL #4   Title  Patient will perform the TUG in 10 seconds or less to decrease fall risk and achieve age based norms for 54 year olds.    Time  8    Period  Weeks    Status  New             Plan - 04/25/17 1151    Clinical Impression Statement  Reviewed pt evaluation and goals wiith pt and son.  Began postural exercies.  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     Rehab Potential  Fair    PT Frequency  2x / week    PT Duration  8 weeks    PT Treatment/Interventions  ADLs/Self Care Home Management;Gait training;Stair training;Neuromuscular re-education;Functional mobility training;Moist Heat;Cryotherapy;DME Instruction;Balance training;Therapeutic exercise;Therapeutic activities;Patient/family education;Manual techniques;Passive range of motion;Energy conservation;Taping    PT Next Visit Plan  Begin t-band decompression exercises and progress with proximal strengthening exercises     PT Home Exercise Plan  initiate on first treatment,: hip flexor/quad stretch and balance activities    Consulted and Agree with Plan of Care  Patient;Family member/caregiver    Family Member Consulted  patient's friend "Clare Gandy"       Patient will benefit from skilled therapeutic intervention in order to improve the following deficits and impairments:  Abnormal gait, Decreased knowledge of use of DME, Impaired sensation, Improper body mechanics, Decreased mobility, Postural dysfunction, Decreased activity tolerance, Decreased range of motion, Decreased strength, Decreased balance, Difficulty walking, Impaired flexibility  Visit Diagnosis: Other abnormalities of gait and mobility  Muscle weakness (generalized)  Musculoskeletal abnormal finding on examination     Problem List Patient Active Problem List   Diagnosis Date Noted  . Dysphagia 09/27/2015  . History of colonic polyps 07/28/2012  . Left sided abdominal pain 07/28/2012  . Hypothyroidism 07/28/2012  . OA (osteoarthritis) of knee 06/24/2011   Rayetta Humphrey, PT CLT [phone] 04/25/2017, 12:07 PM  [place], [place], [address] Phone: [phone]   Fax:  [phone]  Name: Evan Miller MRN: [id number] Date of Birth: 27-Feb-1926

## 2017-04-25 NOTE — Patient Instructions (Addendum)
Flexibility: Neck Retraction    Pull head straight back, keeping eyes and jaw level. Repeat ___10_ times per set. Do __1__ sets per session. Do __2__ sessions per day.  http://orth.exer.us/344   Copyright  VHI. All rights reserved.  Scapular Retraction (Standing)   You may do this sitting  With arms at sides, pinch shoulder blades together. Repeat __10__ times per set. Do _1___ sets per session. Do _2___ sessions per day.  http://orth.exer.us/944   Copyright  VHI. All rights reserved.  Bridging    Slowly raise buttocks from floor, keeping stomach tight. Repeat __10__ times per set. Do ___1_ sets per session. Do _2___ sessions per day.  http://orth.exer.us/1096   Copyright  VHI. All rights reserved.  Hamstring Stretch: Active    Support behind right knee. Starting with knee bent, attempt to straighten knee until a comfortable stretch is felt in back of thigh. Hold _30___ seconds. Repeat __3__ times per set. Do __1__ sets per session. Do __2__ sessions per day. 3 http://orth.exer.us/158   Copyright  VHI. All rights reserved.

## 2017-04-28 ENCOUNTER — Encounter (HOSPITAL_COMMUNITY): Payer: Self-pay

## 2017-04-28 ENCOUNTER — Other Ambulatory Visit: Payer: Self-pay

## 2017-04-28 ENCOUNTER — Ambulatory Visit (HOSPITAL_COMMUNITY): Payer: Medicare Other

## 2017-04-28 DIAGNOSIS — M6281 Muscle weakness (generalized): Secondary | ICD-10-CM

## 2017-04-28 DIAGNOSIS — R2689 Other abnormalities of gait and mobility: Secondary | ICD-10-CM

## 2017-04-28 DIAGNOSIS — R2991 Unspecified symptoms and signs involving the musculoskeletal system: Secondary | ICD-10-CM

## 2017-04-28 NOTE — Therapy (Signed)
[place], [place], [address] Phone: [phone]   Fax:  [phone]  Physical Therapy Treatment  Patient Details  Name: Evan Miller MRN: [id number] Date of Birth: [birth year] Referring Provider: Ashok Pall, MD   Encounter Date: 04/28/2017  PT End of Session - 04/28/17 1448    Visit Number  3    Number of Visits  17    Date for PT Re-Evaluation  05/20/17    Authorization Type  [employer] Medicare    Authorization Time Period  04/22/17-06/17/17    Authorization - Visit Number  3    Authorization - Number of Visits  17    PT Start Time  1301    PT Stop Time  1345    PT Time Calculation (min)  44 min    Equipment Utilized During Treatment  -- rollator    Activity Tolerance  Patient tolerated treatment well    Behavior During Therapy  [facility] for tasks assessed/performed       Past Medical History:  Diagnosis Date  . Arthritis    back,shoulders and hips  . Cancer ([place])    basal cell/ Melanoma 1997 left  shoulder/  PROSTATE  . GERD (gastroesophageal reflux disease)   . Hepatitis    age 50 years  . Hypothyroidism   . PONV (postoperative nausea and vomiting)   . Prostate CA [facility])     Past Surgical History:  Procedure Laterality Date  . CATARACT EXTRACTION PHACO AND INTRAOCULAR LENS PLACEMENT ([place]) Right 12/30/2011   Performed by Tonny Branch, MD at [facility]  . CATARACT EXTRACTION PHACO AND INTRAOCULAR LENS PLACEMENT ([place]) Left 12/16/2011   Performed by Tonny Branch, MD at [facility]  . COLONOSCOPY    . COLONOSCOPY N/A 11/22/2015   Performed by Rogene Houston, MD at [place]  . COLONOSCOPY N/A 08/07/2012   Performed by Rogene Houston, MD at [place]  11/22/2015   Performed by Rogene Houston, MD at [place] N/A 03/29/2015   Performed by Rogene Houston, MD at [place] N/A 02/10/2015   Performed by Rogene Houston, MD at [place]  . ESOPHAGOGASTRODUODENOSCOPY (EGD) N/A 11/22/2015   Performed by Rogene Houston, MD at [place]  . ESOPHAGOGASTRODUODENOSCOPY (EGD) N/A 03/29/2015   Performed by Rogene Houston, MD at [place]  . ESOPHAGOGASTRODUODENOSCOPY (EGD) N/A 02/10/2015   Performed by Rogene Houston, MD at [place]  . HERNIA REPAIR    . HYDROCELE EXCISION     bilateral  . JOINT REPLACEMENT     left knee 9/12  . TOTAL KNEE ARTHROPLASTY Right 06/24/2011   Performed by Gearlean Alf, MD at [facility]    There were no vitals filed for this visit.  Subjective Assessment - 04/28/17 1257    Subjective  Patient is progressing in therapy and is states he doesn;t have any pain. He was unable to recall the hamstring stretch he had been given for his HEP last session. He is pleasant and eager to begin therapy.    Patient is accompained by:  -- friend Clare Gandy)    How long can you sit comfortably?  as long as I want    How long can you stand comfortably?  at least 30 minutes as long as I have something to hold onto  How long can you walk comfortably?  walk around my house, 100 feet    Diagnostic tests  Had an MRI on lumbar region, MD discussed that he has disc degeneration    Patient Stated Goals  I'd like to get moving more and be able to walk like I used to.    Currently in Pain?  No/denies      [facility] Adult PT Treatment/Exercise - 04/28/17 0001               Lumbar Exercises: Stretches   Passive Hamstring Stretch  3 reps;30 seconds;Limitations    Passive Hamstring Stretch Limitations  BLE    Quad Stretch  3 reps;30 seconds;Limitations    Quad Stretch Limitations  BLE, cues for contract relax to facilitate relaxation      Lumbar Exercises: Standing   Other Standing Lumbar Exercises  4" step up BLE, 10 reps    Other Standing Lumbar Exercises  3 way hip with BUE support, pateint requires frequent cues to maintain upright posture throughout      Lumbar Exercises: Seated   Sit to Stand  10  reps;Limitations    Sit to Stand Limitations  2 sets with no UE support from standard chair, cues needed to fully stand upright      Knee/Hip Exercises: Standing   Hip Abduction  Stengthening    Abduction Limitations  Side stepping 6x 15 feet, cues for foot clearance on BLE        PT Education - 04/28/17 1446    Education provided  Yes    Education Details  Educated on safe use of rollator, proper form for exercises    Person(s) Educated  Patient    Methods  Explanation    Comprehension  Verbalized understanding;Need further instruction       PT Short Term Goals - 04/22/17 1352      PT SHORT TERM GOAL #1   Title  Patient will be independent with HEP to improve balance and LE muscular strength to improve functional mobility.    Time  2    Period  Weeks    Status  New    Target Date  05/06/17      PT SHORT TERM GOAL #2   Title  Patient will have improved MMT for BLE 1/2 grade to improve functional strength to progress functional independence.     Time  4    Period  Weeks    Status  New    Target Date  05/20/17      PT SHORT TERM GOAL #3   Title  Patient will improve SLS to greater than or equal to 5 seconds bilaterally to fall within age predicted norms for an 81 year old male and decrease fall risk.    Baseline  0 seconds    Time  4    Period  Weeks    Status  New      PT SHORT TERM GOAL #4   Title  Patient will perform the TUG in 14 seconds or less to decrease fall risk and progress towards age based norms for 9 year olds.    Time  8    Period  Weeks    Status  New        PT Long Term Goals - 04/22/17 1359      PT LONG TERM GOAL #1   Title  Patient will have improved MMT for BLE 1 grade to improve functional strength to progress functional independence.  Time  8    Period  Weeks    Status  New    Target Date  06/17/17      PT LONG TERM GOAL #2   Title  Patient will ambulate 100 feet greater during 3 minute walk test to demonstrate significant  improvement in aerobic walking endurance.    Time  8    Period  Weeks    Status  New      PT LONG TERM GOAL #3   Title  Patient will perform the five time sit to stand in 14 seconds or less to decrease fall risk and achieve age based norms for 72 year olds.    Time  8    Period  Weeks    Status  New      PT LONG TERM GOAL #4   Title  Patient will perform the TUG in 10 seconds or less to decrease fall risk and achieve age based norms for 80 year olds.    Time  8    Period  Weeks    Status  New            Plan - 04/28/17 1449    Clinical Impression Statement  Patient is progressing in therapy and began balance traning and functional strengthening. He is HOH and continues to require frequent cues to maintain proper posture/form throughout therapy. He will continue to benefit from skilled PT to address current impairments and improve functinoal independence.    Rehab Potential  Fair    PT Frequency  2x / week    PT Duration  8 weeks    PT Treatment/Interventions  ADLs/Self Care Home Management;Gait training;Stair training;Neuromuscular re-education;Functional mobility training;Moist Heat;Cryotherapy;DME Instruction;Balance training;Therapeutic exercise;Therapeutic activities;Patient/family education;Manual techniques;Passive range of motion;Energy conservation;Taping    PT Next Visit Plan  Begin t-band decompression exercises and progress with proximal strengthening exercises, incorporate functional strengthening for hips and dynamic gait challenges. Review safe use of rollator. Patient likes bluegrass music and may enjoy dynamic balance challenges with music for auditory cues.    PT Home Exercise Plan  initiate on first treatment,: hip flexor/quad stretch and balance activities    Consulted and Agree with Plan of Care  Patient;Family member/caregiver    Family Member Consulted  patient's friend "Clare Gandy"       Patient will benefit from skilled therapeutic intervention in order to  improve the following deficits and impairments:  Abnormal gait, Decreased knowledge of use of DME, Impaired sensation, Improper body mechanics, Decreased mobility, Postural dysfunction, Decreased activity tolerance, Decreased range of motion, Decreased strength, Decreased balance, Difficulty walking, Impaired flexibility  Visit Diagnosis: Other abnormalities of gait and mobility  Muscle weakness (generalized)  Musculoskeletal abnormal finding on examination     Problem List Patient Active Problem List   Diagnosis Date Noted  . Dysphagia 09/27/2015  . History of colonic polyps 07/28/2012  . Left sided abdominal pain 07/28/2012  . Hypothyroidism 07/28/2012  . OA (osteoarthritis) of knee 06/24/2011    Debara Pickett, PT, DPT Physical Therapist with [facility]  04/28/2017 2:57 PM    [place], [place], [address] Phone: [phone]   Fax:  [phone]  Name: Evan Miller MRN: [id number] Date of Birth: 1925-07-29

## 2017-04-30 ENCOUNTER — Other Ambulatory Visit: Payer: Self-pay

## 2017-04-30 ENCOUNTER — Encounter (HOSPITAL_COMMUNITY): Payer: Self-pay | Admitting: Physical Therapy

## 2017-04-30 ENCOUNTER — Ambulatory Visit (HOSPITAL_COMMUNITY): Payer: Medicare Other | Admitting: Physical Therapy

## 2017-04-30 DIAGNOSIS — R2689 Other abnormalities of gait and mobility: Secondary | ICD-10-CM | POA: Diagnosis not present

## 2017-04-30 DIAGNOSIS — M6281 Muscle weakness (generalized): Secondary | ICD-10-CM

## 2017-04-30 DIAGNOSIS — R2991 Unspecified symptoms and signs involving the musculoskeletal system: Secondary | ICD-10-CM

## 2017-04-30 NOTE — Therapy (Signed)
[place], [place], [address] Phone: [phone]   Fax:  [phone]  Physical Therapy Treatment  Patient Details  Name: Evan Miller MRN: [id number] Date of Birth: 1925/10/14 Referring Provider: Ashok Pall, MD   Encounter Date: 04/30/2017  PT End of Session - 04/30/17 1437    Visit Number  4    Number of Visits  17    Date for PT Re-Evaluation  05/20/17    Authorization Type  [employer] Medicare    Authorization Time Period  04/22/17-06/17/17    Authorization - Visit Number  4    Authorization - Number of Visits  17    PT Start Time  1350    PT Stop Time  1430    PT Time Calculation (min)  40 min    Equipment Utilized During Treatment  -- rollator    Activity Tolerance  Patient tolerated treatment well    Behavior During Therapy  [facility] for tasks assessed/performed       Past Medical History:  Diagnosis Date  . Arthritis    back,shoulders and hips  . Cancer ([place])    basal cell/ Melanoma 1997 left  shoulder/  PROSTATE  . GERD (gastroesophageal reflux disease)   . Hepatitis    age 88 years  . Hypothyroidism   . PONV (postoperative nausea and vomiting)   . Prostate CA [facility])     Past Surgical History:  Procedure Laterality Date  . CATARACT EXTRACTION W/PHACO  12/16/2011   Procedure: CATARACT EXTRACTION PHACO AND INTRAOCULAR LENS PLACEMENT (I[facility]);  Surgeon: Tonny Branch, MD;  Location: [facility];  Service: Ophthalmology;  Laterality: Left;  CDE:17.24   . CATARACT EXTRACTION W/PHACO  12/30/2011   Procedure: CATARACT EXTRACTION PHACO AND INTRAOCULAR LENS PLACEMENT ([facility]);  Surgeon: Tonny Branch, MD;  Location: [facility];  Service: Ophthalmology;  Laterality: Right;  CDE 15.40  . COLONOSCOPY    . COLONOSCOPY N/A 08/07/2012   Procedure: COLONOSCOPY;  Surgeon: Rogene Houston, MD;  [facility]: [facility];  Service: Endoscopy;  Laterality: N/A;  10:00  . COLONOSCOPY N/A 11/22/2015   Procedure: COLONOSCOPY;  Surgeon: Rogene Houston, MD;  [facility];  Service: Endoscopy;  Laterality: N/A;  12:00 - moved to 10:30 - Ann to notify  . ESOPHAGEAL DILATION N/A 02/10/2015   Procedure: ESOPHAGEAL DILATION;  Surgeon: Rogene Houston, MD;  [facility];  Service: Endoscopy;  Laterality: N/A;  . ESOPHAGEAL DILATION N/A 03/29/2015   Procedure: ESOPHAGEAL DILATION;  Surgeon: Rogene Houston, MD;  [facility];  Service: Endoscopy;  Laterality: N/A;  . ESOPHAGEAL DILATION  11/22/2015   Procedure: ESOPHAGEAL DILATION;  Surgeon: Rogene Houston, MD;  [facility];  Service: Endoscopy;;  . ESOPHAGOGASTRODUODENOSCOPY N/A 02/10/2015   Procedure: ESOPHAGOGASTRODUODENOSCOPY (EGD);  Surgeon: Rogene Houston, MD;  [facility];  Service: Endoscopy;  Laterality: N/A;  925 - moved to 10:20 - Ann to notify pt  . ESOPHAGOGASTRODUODENOSCOPY N/A 03/29/2015   Procedure: ESOPHAGOGASTRODUODENOSCOPY (EGD);  Surgeon: Rogene Houston, MD;  [facility];  Service: Endoscopy;  Laterality: N/A;  145 - moved to 8:30 - Ann notified pt  . ESOPHAGOGASTRODUODENOSCOPY N/A 11/22/2015   Procedure: ESOPHAGOGASTRODUODENOSCOPY (EGD);  Surgeon: Rogene Houston, MD;  [facility];  Service: Endoscopy;  Laterality: N/A;  . HERNIA REPAIR    . HYDROCELE EXCISION     bilateral  . JOINT REPLACEMENT  left knee 9/12  . TOTAL KNEE ARTHROPLASTY  06/24/2011   Procedure: TOTAL KNEE ARTHROPLASTY;  Surgeon: Gearlean Alf;  [facility]: [facility];  Service: Orthopedics;  Laterality: Right;    There were no vitals filed for this visit.  Subjective Assessment - 04/30/17 1354    Subjective  Patient is pleasant and had a good day since last session. He spent yesterday at the [place]ingo [place] and went out to eat with Ted. He reports he has been keeping up with his exercises and denies any falls.    Patient is accompained by:  -- friend Ted    Patient Stated Goals  I'd like to get moving more and be able to walk like  I used to.    Currently in Pain?  No/denies his left shlder has been bothering him       [facility] Adult PT Treatment/Exercise - 04/30/17 0001      Lumbar Exercises: Stretches   Passive Hamstring Stretch  3 reps;30 seconds;Limitations    Passive Hamstring Stretch Limitations  BLE    Quad Stretch  3 reps;30 seconds;Limitations    Sports administrator Limitations  BLE      Lumbar Exercises: Aerobic   Stationary Bike  NuStep for 5 minutes level 2/3 split between time, BLE only      Knee/Hip Exercises: Standing   Lateral Step Up  Both;1 set;10 reps;Hand Hold: 2;Step Height: 4";Limitations    Lateral Step Up Limitations  cues to look up    Forward Step Up  Both;1 set;10 reps;Hand Hold: 2;Step Height: 4";Limitations    Forward Step Up Limitations  cues to look up    Other Standing Knee Exercises  2x narrow BOS, 2x modified tandem: on foam, with no UE support and perturbations for postural righting       PT Education - 04/30/17 1436    Education provided  Yes    Education Details  educated on proper form with exercises and on safe use of rollator, pateitn will require continued education on use of rollator with brakes for safety    Person(s) Educated  Patient    Methods  Explanation;Demonstration    Comprehension  Verbalized understanding;Need further instruction       PT Short Term Goals - 04/22/17 1352      PT SHORT TERM GOAL #1   Title  Patient will be independent with HEP to improve balance and LE muscular strength to improve functional mobility.    Time  2    Period  Weeks    Status  New    Target Date  05/06/17      PT SHORT TERM GOAL #2   Title  Patient will have improved MMT for BLE 1/2 grade to improve functional strength to progress functional independence.     Time  4    Period  Weeks    Status  New    Target Date  05/20/17      PT SHORT TERM GOAL #3   Title  Patient will improve SLS to greater than or equal to 5 seconds bilaterally to fall within age predicted norms for an  81 year old male and decrease fall risk.    Baseline  0 seconds    Time  4    Period  Weeks    Status  New      PT SHORT TERM GOAL #4   Title  Patient will perform the TUG in 14 seconds or less to decrease fall risk and  progress towards age based norms for 46 year olds.    Time  8    Period  Weeks    Status  New        PT Long Term Goals - 04/22/17 1359      PT LONG TERM GOAL #1   Title  Patient will have improved MMT for BLE 1 grade to improve functional strength to progress functional independence.     Time  8    Period  Weeks    Status  New    Target Date  06/17/17      PT LONG TERM GOAL #2   Title  Patient will ambulate 100 feet greater during 3 minute walk test to demonstrate significant improvement in aerobic walking endurance.    Time  8    Period  Weeks    Status  New      PT LONG TERM GOAL #3   Title  Patient will perform the five time sit to stand in 14 seconds or less to decrease fall risk and achieve age based norms for 55 year olds.    Time  8    Period  Weeks    Status  New      PT LONG TERM GOAL #4   Title  Patient will perform the TUG in 10 seconds or less to decrease fall risk and achieve age based norms for 50 year olds.    Time  8    Period  Weeks    Status  New       Plan - 04/30/17 1437    Clinical Impression Statement  Patient is steadily progressing in therapy. He had improved relaxation wtih BLE stretching today and demonstrated good postural righting with balacne interventiosn in today's session. He will continue to benefit from skilled PT to adress current impairments and improve functinoal independenc adn mobility.    Rehab Potential  Fair    PT Frequency  2x / week    PT Duration  8 weeks    PT Treatment/Interventions  ADLs/Self Care Home Management;Gait training;Stair training;Neuromuscular re-education;Functional mobility training;Moist Heat;Cryotherapy;DME Instruction;Balance training;Therapeutic exercise;Therapeutic  activities;Patient/family education;Manual techniques;Passive range of motion;Energy conservation;Taping    PT Next Visit Plan  Update HEP and review safe use of rollator and perform dynamic gait challenges with gait belt and with/without rollator. Begin t-band decompression exercises and progress with proximal strengthening exercises, incorporate functional strengthening for hips and dynamic gait challenges. Patient likes bluegrass music and may enjoy dynamic balance challenges with music for auditory cues.    PT Home Exercise Plan  initiate on first treatment,: hip flexor/quad stretch and balance activities    Consulted and Agree with Plan of Care  Patient;Family member/caregiver    Family Member Consulted  patient's friend "Clare Gandy"       Patient will benefit from skilled therapeutic intervention in order to improve the following deficits and impairments:  Abnormal gait, Decreased knowledge of use of DME, Impaired sensation, Improper body mechanics, Decreased mobility, Postural dysfunction, Decreased activity tolerance, Decreased range of motion, Decreased strength, Decreased balance, Difficulty walking, Impaired flexibility  Visit Diagnosis: Other abnormalities of gait and mobility  Muscle weakness (generalized)  Musculoskeletal abnormal finding on examination     Problem List Patient Active Problem List   Diagnosis Date Noted  . Dysphagia 09/27/2015  . History of colonic polyps 07/28/2012  . Left sided abdominal pain 07/28/2012  . Hypothyroidism 07/28/2012  . OA (osteoarthritis) of knee 06/24/2011    Debara Pickett, PT,  DPT Physical Therapist with [facility]  04/30/2017 2:44 PM    [place], [place], [address] Phone: [phone]   Fax:  [phone]  Name: Evan Miller MRN: [id number] Date of Birth: [birth year]

## 2017-05-05 ENCOUNTER — Ambulatory Visit (HOSPITAL_COMMUNITY): Payer: Medicare Other | Admitting: Physical Therapy

## 2017-05-05 ENCOUNTER — Encounter (HOSPITAL_COMMUNITY): Payer: Self-pay | Admitting: Physical Therapy

## 2017-05-05 DIAGNOSIS — R2689 Other abnormalities of gait and mobility: Secondary | ICD-10-CM

## 2017-05-05 DIAGNOSIS — R2991 Unspecified symptoms and signs involving the musculoskeletal system: Secondary | ICD-10-CM

## 2017-05-05 DIAGNOSIS — M6281 Muscle weakness (generalized): Secondary | ICD-10-CM

## 2017-05-05 NOTE — Therapy (Signed)
[place], [place], [address] Phone: [phone]   Fax:  [phone]  Physical Therapy Treatment  Patient Details  Name: SANDEEP RADELL MRN: [id number] Date of Birth: Apr 22, 1926 Referring Provider: Ashok Pall, MD   Encounter Date: 05/05/2017  PT End of Session - 05/05/17 1202    Visit Number  5    Number of Visits  17    Date for PT Re-Evaluation  05/20/17    Authorization Type  [employer] Medicare    Authorization Time Period  04/22/17-06/17/17    Authorization - Visit Number  5    Authorization - Number of Visits  17    PT Start Time  3235    PT Stop Time  1158    PT Time Calculation (min)  41 min    Equipment Utilized During Treatment  Gait belt    Activity Tolerance  Patient tolerated treatment well    Behavior During Therapy  [facility] for tasks assessed/performed       Past Medical History:  Diagnosis Date  . Arthritis    back,shoulders and hips  . Cancer ([place])    basal cell/ Melanoma 1997 left  shoulder/  PROSTATE  . GERD (gastroesophageal reflux disease)   . Hepatitis    age 26 years  . Hypothyroidism   . PONV (postoperative nausea and vomiting)   . Prostate CA [facility])     Past Surgical History:  Procedure Laterality Date  . CATARACT EXTRACTION W/PHACO  12/16/2011   Procedure: CATARACT EXTRACTION PHACO AND INTRAOCULAR LENS PLACEMENT (IOC);  Surgeon: Tonny Branch, MD;  [facility]: [facility];  Service: Ophthalmology;  Laterality: Left;  CDE:17.24   . CATARACT EXTRACTION W/PHACO  12/30/2011   Procedure: CATARACT EXTRACTION PHACO AND INTRAOCULAR LENS PLACEMENT (IOC);  Surgeon: Tonny Branch, MD;  Location: [facility];  Service: Ophthalmology;  Laterality: Right;  CDE 15.40  . COLONOSCOPY    . COLONOSCOPY N/A 08/07/2012   Procedure: COLONOSCOPY;  Surgeon: Rogene Houston, MD;  [facility];  Service: Endoscopy;  Laterality: N/A;  10:00  . COLONOSCOPY N/A 11/22/2015   Procedure: COLONOSCOPY;  Surgeon: Rogene Houston, MD;  [facility];  Service: Endoscopy;  Laterality: N/A;  12:00 - moved to 10:30 - Ann to notify  . ESOPHAGEAL DILATION N/A 02/10/2015   Procedure: ESOPHAGEAL DILATION;  Surgeon: Rogene Houston, MD;  [facility];  Service: Endoscopy;  Laterality: N/A;  . ESOPHAGEAL DILATION N/A 03/29/2015   Procedure: ESOPHAGEAL DILATION;  Surgeon: Rogene Houston, MD;  [facility];  Service: Endoscopy;  Laterality: N/A;  . ESOPHAGEAL DILATION  11/22/2015   Procedure: ESOPHAGEAL DILATION;  Surgeon: Rogene Houston, MD;  [facility];  Service: Endoscopy;;  . ESOPHAGOGASTRODUODENOSCOPY N/A 02/10/2015   Procedure: ESOPHAGOGASTRODUODENOSCOPY (EGD);  Surgeon: Rogene Houston, MD;  [facility];  Service: Endoscopy;  Laterality: N/A;  925 - moved to 10:20 - Ann to notify pt  . ESOPHAGOGASTRODUODENOSCOPY N/A 03/29/2015   Procedure: ESOPHAGOGASTRODUODENOSCOPY (EGD);  Surgeon: Rogene Houston, MD;  [facility];  Service: Endoscopy;  Laterality: N/A;  145 - moved to 8:30 - Ann notified pt  . ESOPHAGOGASTRODUODENOSCOPY N/A 11/22/2015   Procedure: ESOPHAGOGASTRODUODENOSCOPY (EGD);  Surgeon: Rogene Houston, MD;  [facility];  Service: Endoscopy;  Laterality: N/A;  . HERNIA REPAIR    . HYDROCELE EXCISION     bilateral  . JOINT REPLACEMENT  left knee 9/12  . TOTAL KNEE ARTHROPLASTY  06/24/2011   Procedure: TOTAL KNEE ARTHROPLASTY;  Surgeon: Gearlean Alf;  [facility];  Service: Orthopedics;  Laterality: Right;    There were no vitals filed for this visit.  Subjective Assessment - 05/05/17 1120    Subjective  Patient arrives with his friend Clare Gandy and is very pleasant; he reports he feels he is getting better with PT but is not sure what he feels he needs to work on the most. No falls or close calls recently.     Patient Stated Goals  I'd like to get moving more and be able to walk like I used to.    Currently in Pain?  No/denies                       [facility] Adult PT Treatment/Exercise - 05/05/17 0001      Lumbar Exercises: Stretches   Passive Hamstring Stretch  2 reps;60 seconds    Passive Hamstring Stretch Limitations  BLE     Quad Stretch  2 reps;30 seconds    Quad Stretch Limitations  prone     Piriformis Stretch  2 reps;30 seconds      Lumbar Exercises: Supine   Bridge  15 reps      Lumbar Exercises: Sidelying   Hip Abduction  10 reps          Balance Exercises - 05/05/17 1141      Balance Exercises: Standing   Standing Eyes Closed  Narrow base of support (BOS);Foam/compliant surface;4 reps;20 secs EC     Tandem Stance  Eyes open;3 reps;15 secs    Standing, One Foot on a Step  Eyes open;Head turns;6 inch;3 reps    Tandem Gait  Forward;2 reps min guard to min assist         PT Education - 05/05/17 1202    Education provided  No       PT Short Term Goals - 04/22/17 1352      PT SHORT TERM GOAL #1   Title  Patient will be independent with HEP to improve balance and LE muscular strength to improve functional mobility.    Time  2    Period  Weeks    Status  New    Target Date  05/06/17      PT SHORT TERM GOAL #2   Title  Patient will have improved MMT for BLE 1/2 grade to improve functional strength to progress functional independence.     Time  4    Period  Weeks    Status  New    Target Date  05/20/17      PT SHORT TERM GOAL #3   Title  Patient will improve SLS to greater than or equal to 5 seconds bilaterally to fall within age predicted norms for an 81 year old male and decrease fall risk.    Baseline  0 seconds    Time  4    Period  Weeks    Status  New      PT SHORT TERM GOAL #4   Title  Patient will perform the TUG in 14 seconds or less to decrease fall risk and progress towards age based norms for 68 year olds.    Time  8    Period  Weeks    Status  New        PT Long Term Goals - 04/22/17 1359  PT LONG TERM GOAL #1   Title  Patient will have  improved MMT for BLE 1 grade to improve functional strength to progress functional independence.     Time  8    Period  Weeks    Status  New    Target Date  06/17/17      PT LONG TERM GOAL #2   Title  Patient will ambulate 100 feet greater during 3 minute walk test to demonstrate significant improvement in aerobic walking endurance.    Time  8    Period  Weeks    Status  New      PT LONG TERM GOAL #3   Title  Patient will perform the five time sit to stand in 14 seconds or less to decrease fall risk and achieve age based norms for 65 year olds.    Time  8    Period  Weeks    Status  New      PT LONG TERM GOAL #4   Title  Patient will perform the TUG in 10 seconds or less to decrease fall risk and achieve age based norms for 62 year olds.    Time  8    Period  Weeks    Status  New            Plan - 05/05/17 1203    Clinical Impression Statement  Continued with functional stretching, strengthening, and balance training as indicated in PT POC; patient very pleasant and cooperative during session and appears to be most challenged by balance exercises today, although he will in general continue to benefit from skilled PT services to address remaining functional deficits moving forward.     Rehab Potential  Fair    PT Frequency  2x / week    PT Duration  8 weeks    PT Treatment/Interventions  ADLs/Self Care Home Management;Gait training;Stair training;Neuromuscular re-education;Functional mobility training;Moist Heat;Cryotherapy;DME Instruction;Balance training;Therapeutic exercise;Therapeutic activities;Patient/family education;Manual techniques;Passive range of motion;Energy conservation;Taping    PT Next Visit Plan  Update HEP and review safe use of rollator and perform dynamic gait challenges with gait belt and with/without rollator. Begin t-band decompression exercises and progress with proximal strengthening exercises, incorporate functional strengthening for hips and dynamic  gait challenges. Patient likes bluegrass music and may enjoy dynamic balance challenges with music for auditory cues.    PT Home Exercise Plan  initiate on first treatment,: hip flexor/quad stretch and balance activities    Consulted and Agree with Plan of Care  Patient    Family Member Consulted  patient's friend "Clare Gandy"       Patient will benefit from skilled therapeutic intervention in order to improve the following deficits and impairments:  Abnormal gait, Decreased knowledge of use of DME, Impaired sensation, Improper body mechanics, Decreased mobility, Postural dysfunction, Decreased activity tolerance, Decreased range of motion, Decreased strength, Decreased balance, Difficulty walking, Impaired flexibility  Visit Diagnosis: Other abnormalities of gait and mobility  Muscle weakness (generalized)  Musculoskeletal abnormal finding on examination     Problem List Patient Active Problem List   Diagnosis Date Noted  . Dysphagia 09/27/2015  . History of colonic polyps 07/28/2012  . Left sided abdominal pain 07/28/2012  . Hypothyroidism 07/28/2012  . OA (osteoarthritis) of knee 06/24/2011    Deniece Ree PT, DPT, CBIS  Supplemental Physical Therapist [facility] [address] [place], [place], [address] Phone: [phone]   Fax:  [phone]  Name:  NANDAN WILLEMS MRN: [id number] Date of Birth: 1926-03-23

## 2017-05-07 ENCOUNTER — Encounter (HOSPITAL_COMMUNITY): Payer: Self-pay

## 2017-05-07 ENCOUNTER — Ambulatory Visit (HOSPITAL_COMMUNITY): Payer: Medicare Other

## 2017-05-07 DIAGNOSIS — R2991 Unspecified symptoms and signs involving the musculoskeletal system: Secondary | ICD-10-CM

## 2017-05-07 DIAGNOSIS — M6281 Muscle weakness (generalized): Secondary | ICD-10-CM

## 2017-05-07 DIAGNOSIS — R2689 Other abnormalities of gait and mobility: Secondary | ICD-10-CM | POA: Diagnosis not present

## 2017-05-07 NOTE — Therapy (Signed)
[place], [place], [address] Phone: [phone]   Fax:  [phone]  Physical Therapy Treatment  Patient Details  Name: Evan Miller MRN: [id number] Date of Birth: [birth year] Referring Provider: Ashok Pall, MD   Encounter Date: 05/07/2017  PT End of Session - 05/07/17 1436    Visit Number  6    Number of Visits  17    Date for PT Re-Evaluation  05/20/17    Authorization Type  [employer]HC Medicare    Authorization Time Period  04/22/17-06/17/17    Authorization - Visit Number  6    Authorization - Number of Visits  17    PT Start Time  4854    PT Stop Time  1428    PT Time Calculation (min)  41 min    Equipment Utilized During Treatment  Gait belt    Activity Tolerance  Patient tolerated treatment well    Behavior During Therapy  [facility] for tasks assessed/performed       Past Medical History:  Diagnosis Date  . Arthritis    back,shoulders and hips  . Cancer ([place])    basal cell/ Melanoma 1997 left  shoulder/  PROSTATE  . GERD (gastroesophageal reflux disease)   . Hepatitis    age 16 years  . Hypothyroidism   . PONV (postoperative nausea and vomiting)   . Prostate CA [facility])     Past Surgical History:  Procedure Laterality Date  . CATARACT EXTRACTION W/PHACO  12/16/2011   Procedure: CATARACT EXTRACTION PHACO AND INTRAOCULAR LENS PLACEMENT (IOC);  Surgeon: Tonny Branch, MD;  [facility]: [facility];  Service: Ophthalmology;  Laterality: Left;  CDE:17.24   . CATARACT EXTRACTION W/PHACO  12/30/2011   Procedure: CATARACT EXTRACTION PHACO AND INTRAOCULAR LENS PLACEMENT (IOC);  Surgeon: Tonny Branch, MD;  Location: [facility];  Service: Ophthalmology;  Laterality: Right;  CDE 15.40  . COLONOSCOPY    . COLONOSCOPY N/A 08/07/2012   Procedure: COLONOSCOPY;  Surgeon: Rogene Houston, MD;  [facility];  Service: Endoscopy;  Laterality: N/A;  10:00  . COLONOSCOPY N/A 11/22/2015   Procedure: COLONOSCOPY;  Surgeon: Rogene Houston, MD;  [facility];  Service: Endoscopy;  Laterality: N/A;  12:00 - moved to 10:30 - Ann to notify  . ESOPHAGEAL DILATION N/A 02/10/2015   Procedure: ESOPHAGEAL DILATION;  Surgeon: Rogene Houston, MD;  [facility];  Service: Endoscopy;  Laterality: N/A;  . ESOPHAGEAL DILATION N/A 03/29/2015   Procedure: ESOPHAGEAL DILATION;  Surgeon: Rogene Houston, MD;  [facility];  Service: Endoscopy;  Laterality: N/A;  . ESOPHAGEAL DILATION  11/22/2015   Procedure: ESOPHAGEAL DILATION;  Surgeon: Rogene Houston, MD;  [facility];  Service: Endoscopy;;  . ESOPHAGOGASTRODUODENOSCOPY N/A 02/10/2015   Procedure: ESOPHAGOGASTRODUODENOSCOPY (EGD);  Surgeon: Rogene Houston, MD;  [facility];  Service: Endoscopy;  Laterality: N/A;  925 - moved to 10:20 - Ann to notify pt  . ESOPHAGOGASTRODUODENOSCOPY N/A 03/29/2015   Procedure: ESOPHAGOGASTRODUODENOSCOPY (EGD);  Surgeon: Rogene Houston, MD;  [facility];  Service: Endoscopy;  Laterality: N/A;  145 - moved to 8:30 - Ann notified pt  . ESOPHAGOGASTRODUODENOSCOPY N/A 11/22/2015   Procedure: ESOPHAGOGASTRODUODENOSCOPY (EGD);  Surgeon: Rogene Houston, MD;  [facility];  Service: Endoscopy;  Laterality: N/A;  . HERNIA REPAIR    . HYDROCELE EXCISION     bilateral  . JOINT REPLACEMENT  left knee 9/12  . TOTAL KNEE ARTHROPLASTY  06/24/2011   Procedure: TOTAL KNEE ARTHROPLASTY;  Surgeon: Gearlean Alf;  [facility];  Service: Orthopedics;  Laterality: Right;    There were no vitals filed for this visit.  Subjective Assessment - 05/07/17 1349    Subjective  Pt note sthat he is "doing okay."     Currently in Pain?  No/denies                      [facility] Adult PT Treatment/Exercise - 05/07/17 0001      Lumbar Exercises: Stretches   Passive Hamstring Stretch  2 reps;60 seconds    Passive Hamstring Stretch Limitations  BLE     Quad Stretch  2 reps;30 seconds     Quad Stretch Limitations  prone     Piriformis Stretch  2 reps;30 seconds      Lumbar Exercises: Supine   Bridge  15 reps      Knee/Hip Exercises: Supine   Bridges  10 reps          Balance Exercises - 05/07/17 1434      Balance Exercises: Standing   Cone Rotation Limitations  Forward/diagonal/backward stepping with cones x 3 laps    Marching Limitations  3 way cone lunge x 5 reps each LE     Other Standing Exercises  standing with ball catch inside/outside BOS x 15 overhead and chest level, stepping forward x 10, stepping backward x 10          PT Short Term Goals - 04/22/17 1352      PT SHORT TERM GOAL #1   Title  Patient will be independent with HEP to improve balance and LE muscular strength to improve functional mobility.    Time  2    Period  Weeks    Status  New    Target Date  05/06/17      PT SHORT TERM GOAL #2   Title  Patient will have improved MMT for BLE 1/2 grade to improve functional strength to progress functional independence.     Time  4    Period  Weeks    Status  New    Target Date  05/20/17      PT SHORT TERM GOAL #3   Title  Patient will improve SLS to greater than or equal to 5 seconds bilaterally to fall within age predicted norms for an 81 year old male and decrease fall risk.    Baseline  0 seconds    Time  4    Period  Weeks    Status  New      PT SHORT TERM GOAL #4   Title  Patient will perform the TUG in 14 seconds or less to decrease fall risk and progress towards age based norms for 81 year olds.    Time  8    Period  Weeks    Status  New        PT Long Term Goals - 04/22/17 1359      PT LONG TERM GOAL #1   Title  Patient will have improved MMT for BLE 1 grade to improve functional strength to progress functional independence.     Time  8    Period  Weeks    Status  New    Target Date  06/17/17      PT LONG TERM GOAL #2   Title  Patient will ambulate 100  feet greater during 3 minute walk test to demonstrate  significant improvement in aerobic walking endurance.    Time  8    Period  Weeks    Status  New      PT LONG TERM GOAL #3   Title  Patient will perform the five time sit to stand in 14 seconds or less to decrease fall risk and achieve age based norms for 81 year olds.    Time  8    Period  Weeks    Status  New      PT LONG TERM GOAL #4   Title  Patient will perform the TUG in 10 seconds or less to decrease fall risk and achieve age based norms for 69 year olds.    Time  8    Period  Weeks    Status  New            Plan - 05/07/17 1437    Clinical Impression Statement  Today's session focused on stretching initially secondary to continued significant restrictions with hamstring, quadriceps and posterior glute musculature. Transitioned into balance exercises with dynamic stabilization with throwing and catching a ball inside and outside of base of support from chest height and overhead. Patient did well with forward stepping while tossing ball, however, presented with decreased step length and clearance stepping backward. Required verbal and visual cueing for cone stepping exercise for balance training, as well, with continued unsteadiness. Therapist did not allow time for decompression exercises this session, plan to begin next session.     Rehab Potential  Fair    PT Frequency  2x / week    PT Duration  8 weeks    PT Treatment/Interventions  ADLs/Self Care Home Management;Gait training;Stair training;Neuromuscular re-education;Functional mobility training;Moist Heat;Cryotherapy;DME Instruction;Balance training;Therapeutic exercise;Therapeutic activities;Patient/family education;Manual techniques;Passive range of motion;Energy conservation;Taping    PT Next Visit Plan  Update HEP and review safe use of rollator and perform dynamic gait challenges with gait belt and with/without rollator. Begin t-band decompression exercises and progress with proximal strengthening exercises, incorporate  functional strengthening for hips and dynamic gait challenges. Patient likes bluegrass music and may enjoy dynamic balance challenges with music for auditory cues.    PT Home Exercise Plan  initiate on first treatment,: hip flexor/quad stretch and balance activities    Consulted and Agree with Plan of Care  Patient    Family Member Consulted  patient's friend "Clare Gandy"       Patient will benefit from skilled therapeutic intervention in order to improve the following deficits and impairments:  Abnormal gait, Decreased knowledge of use of DME, Impaired sensation, Improper body mechanics, Decreased mobility, Postural dysfunction, Decreased activity tolerance, Decreased range of motion, Decreased strength, Decreased balance, Difficulty walking, Impaired flexibility  Visit Diagnosis: Other abnormalities of gait and mobility  Muscle weakness (generalized)  Musculoskeletal abnormal finding on examination     Problem List Patient Active Problem List   Diagnosis Date Noted  . Dysphagia 09/27/2015  . History of colonic polyps 07/28/2012  . Left sided abdominal pain 07/28/2012  . Hypothyroidism 07/28/2012  . OA (osteoarthritis) of knee 06/24/2011   Starr [place] PT, DPT 2:43 PM, 05/07/17 [place] [address] [place], [place], [address] Phone: [phone]   Fax:  [phone]  Name: MIKHI ATHEY MRN: [id number] Date of Birth: 17-Apr-1926

## 2017-05-12 ENCOUNTER — Encounter (HOSPITAL_COMMUNITY): Payer: Self-pay | Admitting: Physical Therapy

## 2017-05-12 ENCOUNTER — Ambulatory Visit (HOSPITAL_COMMUNITY): Payer: Medicare Other | Attending: Neurosurgery | Admitting: Physical Therapy

## 2017-05-12 DIAGNOSIS — M6281 Muscle weakness (generalized): Secondary | ICD-10-CM | POA: Diagnosis present

## 2017-05-12 DIAGNOSIS — R2689 Other abnormalities of gait and mobility: Secondary | ICD-10-CM

## 2017-05-12 DIAGNOSIS — R2991 Unspecified symptoms and signs involving the musculoskeletal system: Secondary | ICD-10-CM | POA: Insufficient documentation

## 2017-05-12 NOTE — Therapy (Signed)
[place], [place], [address] Phone: [phone]   Fax:  [phone]  Physical Therapy Treatment  Patient Details  Name: Evan Miller MRN: [id number] Date of Birth: [birth year] Referring Provider: Ashok Pall, MD   Encounter Date: 05/12/2017  PT End of Session - 05/12/17 1159    Visit Number  7    Number of Visits  17    Date for PT Re-Evaluation  05/20/17    Authorization Type  [employer] Medicare    Authorization Time Period  04/22/17-06/17/17    Authorization - Visit Number  7    Authorization - Number of Visits  17    PT Start Time  1115    PT Stop Time  1156    PT Time Calculation (min)  41 min    Equipment Utilized During Treatment  Gait belt    Activity Tolerance  Patient tolerated treatment well    Behavior During Therapy  [facility] for tasks assessed/performed       Past Medical History:  Diagnosis Date  . Arthritis    back,shoulders and hips  . Cancer ([place])    basal cell/ Melanoma 1997 left  shoulder/  PROSTATE  . GERD (gastroesophageal reflux disease)   . Hepatitis    age 44 years  . Hypothyroidism   . PONV (postoperative nausea and vomiting)   . Prostate CA [facility])     Past Surgical History:  Procedure Laterality Date  . CATARACT EXTRACTION W/PHACO  12/16/2011   Procedure: CATARACT EXTRACTION PHACO AND INTRAOCULAR LENS PLACEMENT (IOC);  Surgeon: Tonny Branch, MD;  Location: [facility];  Service: Ophthalmology;  Laterality: Left;  CDE:17.24   . CATARACT EXTRACTION W/PHACO  12/30/2011   Procedure: CATARACT EXTRACTION PHACO AND INTRAOCULAR LENS PLACEMENT (IOC);  Surgeon: Tonny Branch, MD;  Location: [facility];  Service: Ophthalmology;  Laterality: Right;  CDE 15.40  . COLONOSCOPY    . COLONOSCOPY N/A 08/07/2012   Procedure: COLONOSCOPY;  Surgeon: Rogene Houston, MD;  [facility];  Service: Endoscopy;  Laterality: N/A;  10:00  . COLONOSCOPY N/A 11/22/2015   Procedure: COLONOSCOPY;  Surgeon: Rogene Houston, MD;  [facility];  Service: Endoscopy;  Laterality: N/A;  12:00 - moved to 10:30 - Ann to notify  . ESOPHAGEAL DILATION N/A 02/10/2015   Procedure: ESOPHAGEAL DILATION;  Surgeon: Rogene Houston, MD;  [facility];  Service: Endoscopy;  Laterality: N/A;  . ESOPHAGEAL DILATION N/A 03/29/2015   Procedure: ESOPHAGEAL DILATION;  Surgeon: Rogene Houston, MD;  [facility];  Service: Endoscopy;  Laterality: N/A;  . ESOPHAGEAL DILATION  11/22/2015   Procedure: ESOPHAGEAL DILATION;  Surgeon: Rogene Houston, MD;  [facility];  Service: Endoscopy;;  . ESOPHAGOGASTRODUODENOSCOPY N/A 02/10/2015   Procedure: ESOPHAGOGASTRODUODENOSCOPY (EGD);  Surgeon: Rogene Houston, MD;  [facility];  Service: Endoscopy;  Laterality: N/A;  925 - moved to 10:20 - Ann to notify pt  . ESOPHAGOGASTRODUODENOSCOPY N/A 03/29/2015   Procedure: ESOPHAGOGASTRODUODENOSCOPY (EGD);  Surgeon: Rogene Houston, MD;  [facility];  Service: Endoscopy;  Laterality: N/A;  145 - moved to 8:30 - Ann notified pt  . ESOPHAGOGASTRODUODENOSCOPY N/A 11/22/2015   Procedure: ESOPHAGOGASTRODUODENOSCOPY (EGD);  Surgeon: Rogene Houston, MD;  [facility];  Service: Endoscopy;  Laterality: N/A;  . HERNIA REPAIR    . HYDROCELE EXCISION     bilateral  . JOINT REPLACEMENT  left knee 9/12  . TOTAL KNEE ARTHROPLASTY  06/24/2011   Procedure: TOTAL KNEE ARTHROPLASTY;  Surgeon: Gearlean Alf;  [facility];  Service: Orthopedics;  Laterality: Right;    There were no vitals filed for this visit.  Subjective Assessment - 05/12/17 1110    Subjective  Pt states he has been doing well     Patient is accompained by:  -- friend Clare Gandy)    How long can you sit comfortably?  as long as I want    How long can you stand comfortably?  at least 30 minutes as long as I have something to hold onto    How long can you walk comfortably?  walk around my house, 100 feet     Diagnostic tests  Had an MRI on lumbar region, MD discussed that he has disc degeneration    Patient Stated Goals  I'd like to get moving more and be able to walk like I used to.    Currently in Pain?  No/denies                      [facility] Adult PT Treatment/Exercise - 05/12/17 0001      Ambulation/Gait   Ambulation/Gait  Yes    Assistive device  Rollator    Gait Comments  concentration on being as tall as possible and increaseing his step length       Exercises   Exercises  Lumbar      Lumbar Exercises: Stretches   Active Hamstring Stretch  2 reps;60 seconds    Lower Trunk Rotation  5 reps      Lumbar Exercises: Standing   Heel Raises  10 reps    Functional Squats  10 reps    Other Standing Lumbar Exercises  wall arch with heel raises x 5     Other Standing Lumbar Exercises  SLS x 5; sidestepping x 2 RT       Lumbar Exercises: Seated   Other Seated Lumbar Exercises  Sitting as tall as possible, crevical and scapular retracition x 10       Lumbar Exercises: Supine   Bridge  15 reps    Other Supine Lumbar Exercises  decompression 1-5 and with t band       Knee/Hip Exercises: Standing   Heel Raises  Both;10 reps    Functional Squat  10 reps    Other Standing Knee Exercises  2x narrow BOS, 2x modified tandem: on foam, with no UE support and pertubations for postural righting               PT Short Term Goals - 04/22/17 1352      PT SHORT TERM GOAL #1   Title  Patient will be independent with HEP to improve balance and LE muscular strength to improve functional mobility.    Time  2    Period  Weeks    Status  New    Target Date  05/06/17      PT SHORT TERM GOAL #2   Title  Patient will have improved MMT for BLE 1/2 grade to improve functional strength to progress functional independence.     Time  4    Period  Weeks    Status  New    Target Date  05/20/17      PT SHORT TERM GOAL #3   Title  Patient will improve SLS to greater than or equal to  5 seconds bilaterally to  fall within age predicted norms for an 81 year old male and decrease fall risk.    Baseline  0 seconds    Time  4    Period  Weeks    Status  New      PT SHORT TERM GOAL #4   Title  Patient will perform the TUG in 14 seconds or less to decrease fall risk and progress towards age based norms for 29 year olds.    Time  8    Period  Weeks    Status  New        PT Long Term Goals - 04/22/17 1359      PT LONG TERM GOAL #1   Title  Patient will have improved MMT for BLE 1 grade to improve functional strength to progress functional independence.     Time  8    Period  Weeks    Status  New    Target Date  06/17/17      PT LONG TERM GOAL #2   Title  Patient will ambulate 100 feet greater during 3 minute walk test to demonstrate significant improvement in aerobic walking endurance.    Time  8    Period  Weeks    Status  New      PT LONG TERM GOAL #3   Title  Patient will perform the five time sit to stand in 14 seconds or less to decrease fall risk and achieve age based norms for 38 year olds.    Time  8    Period  Weeks    Status  New      PT LONG TERM GOAL #4   Title  Patient will perform the TUG in 10 seconds or less to decrease fall risk and achieve age based norms for 55 year olds.    Time  8    Period  Weeks    Status  New            Plan - 05/12/17 1201    Clinical Impression Statement  Added decompression t-band as well as heel raises, functional squat, SLS and sidestepping with minimal verbal and tactile cuing needed.  PT has no radicular sx at this time.     Rehab Potential  Fair    PT Frequency  2x / week    PT Duration  8 weeks    PT Treatment/Interventions  ADLs/Self Care Home Management;Gait training;Stair training;Neuromuscular re-education;Functional mobility training;Moist Heat;Cryotherapy;DME Instruction;Balance training;Therapeutic exercise;Therapeutic activities;Patient/family education;Manual techniques;Passive range of  motion;Energy conservation;Taping    PT Next Visit Plan  progress with proximal strengthening exercises, incorporate functional strengthening for hips and dynamic gait challenges. Patient likes bluegrass music and may enjoy dynamic balance challenges with music for auditory cues.    PT Home Exercise Plan  initiate on first treatment,: hip flexor/quad stretch and balance activities    Consulted and Agree with Plan of Care  Patient    Family Member Consulted  patient's friend "Clare Gandy"       Patient will benefit from skilled therapeutic intervention in order to improve the following deficits and impairments:  Abnormal gait, Decreased knowledge of use of DME, Impaired sensation, Improper body mechanics, Decreased mobility, Postural dysfunction, Decreased activity tolerance, Decreased range of motion, Decreased strength, Decreased balance, Difficulty walking, Impaired flexibility  Visit Diagnosis: Other abnormalities of gait and mobility  Muscle weakness (generalized)  Musculoskeletal abnormal finding on examination     Problem List Patient Active Problem List   Diagnosis Date  Noted  . Dysphagia 09/27/2015  . History of colonic polyps 07/28/2012  . Left sided abdominal pain 07/28/2012  . Hypothyroidism 07/28/2012  . OA (osteoarthritis) of knee 06/24/2011   Rayetta Humphrey, PT CLT [id number] 05/12/2017, 12:03 PM  [place] [address] [place], [place], [address] Phone: [phone]   Fax:  [phone]  Name: Evan Miller MRN: [id number] Date of Birth: July 09, 1925

## 2017-05-14 ENCOUNTER — Ambulatory Visit (HOSPITAL_COMMUNITY): Payer: Medicare Other

## 2017-05-14 ENCOUNTER — Other Ambulatory Visit: Payer: Self-pay

## 2017-05-14 ENCOUNTER — Encounter (HOSPITAL_COMMUNITY): Payer: Self-pay

## 2017-05-14 DIAGNOSIS — R2991 Unspecified symptoms and signs involving the musculoskeletal system: Secondary | ICD-10-CM

## 2017-05-14 DIAGNOSIS — M6281 Muscle weakness (generalized): Secondary | ICD-10-CM

## 2017-05-14 DIAGNOSIS — R2689 Other abnormalities of gait and mobility: Secondary | ICD-10-CM | POA: Diagnosis not present

## 2017-05-14 NOTE — Therapy (Signed)
[place], [place], [address] Phone: [phone]   Fax:  [phone]  Physical Therapy Treatment  Patient Details  Name: Evan Miller MRN: [id number] Date of Birth: 11/07/1925 Referring Provider: Ashok Pall, MD   Encounter Date: 05/14/2017  PT End of Session - 05/14/17 1226    Visit Number  8    Number of Visits  17    Date for PT Re-Evaluation  05/20/17    Authorization Type  [employer] Medicare    Authorization Time Period  04/22/17-06/17/17    Authorization - Visit Number  8    Authorization - Number of Visits  17    PT Start Time  1117    PT Stop Time  1159    PT Time Calculation (min)  42 min    Equipment Utilized During Treatment  Gait belt    Activity Tolerance  Patient tolerated treatment well    Behavior During Therapy  WFL for tasks assessed/performed       Past Medical History:  Diagnosis Date  . Arthritis    back,shoulders and hips  . Cancer ([place])    basal cell/ Melanoma 1997 left  shoulder/  PROSTATE  . GERD (gastroesophageal reflux disease)   . Hepatitis    age 31 years  . Hypothyroidism   . PONV (postoperative nausea and vomiting)   . Prostate CA [facility])     Past Surgical History:  Procedure Laterality Date  . CATARACT EXTRACTION W/PHACO  12/16/2011   Procedure: CATARACT EXTRACTION PHACO AND INTRAOCULAR LENS PLACEMENT (IOC);  Surgeon: Tonny Branch, MD;  Location: [facility];  Service: Ophthalmology;  Laterality: Left;  CDE:17.24   . CATARACT EXTRACTION W/PHACO  12/30/2011   Procedure: CATARACT EXTRACTION PHACO AND INTRAOCULAR LENS PLACEMENT (IOC);  Surgeon: Tonny Branch, MD;  [facility];  Service: Ophthalmology;  Laterality: Right;  CDE 15.40  . COLONOSCOPY    . COLONOSCOPY N/A 08/07/2012   Procedure: COLONOSCOPY;  Surgeon: Rogene Houston, MD;  [facility];  Service: Endoscopy;  Laterality: N/A;  10:00  . COLONOSCOPY N/A 11/22/2015   Procedure: COLONOSCOPY;  Surgeon: Rogene Houston, MD;  [facility];  Service: Endoscopy;  Laterality: N/A;  12:00 - moved to 10:30 - Ann to notify  . ESOPHAGEAL DILATION N/A 02/10/2015   Procedure: ESOPHAGEAL DILATION;  Surgeon: Rogene Houston, MD;  [facility];  Service: Endoscopy;  Laterality: N/A;  . ESOPHAGEAL DILATION N/A 03/29/2015   Procedure: ESOPHAGEAL DILATION;  Surgeon: Rogene Houston, MD;  [facility];  Service: Endoscopy;  Laterality: N/A;  . ESOPHAGEAL DILATION  11/22/2015   Procedure: ESOPHAGEAL DILATION;  Surgeon: Rogene Houston, MD;  [facility];  Service: Endoscopy;;  . ESOPHAGOGASTRODUODENOSCOPY N/A 02/10/2015   Procedure: ESOPHAGOGASTRODUODENOSCOPY (EGD);  Surgeon: Rogene Houston, MD;  [facility];  Service: Endoscopy;  Laterality: N/A;  925 - moved to 10:20 - Ann to notify pt  . ESOPHAGOGASTRODUODENOSCOPY N/A 03/29/2015   Procedure: ESOPHAGOGASTRODUODENOSCOPY (EGD);  Surgeon: Rogene Houston, MD;  [facility];  Service: Endoscopy;  Laterality: N/A;  145 - moved to 8:30 - Ann notified pt  . ESOPHAGOGASTRODUODENOSCOPY N/A 11/22/2015   Procedure: ESOPHAGOGASTRODUODENOSCOPY (EGD);  Surgeon: Rogene Houston, MD;  [facility];  Service: Endoscopy;  Laterality: N/A;  . HERNIA REPAIR    . HYDROCELE EXCISION     bilateral  . JOINT REPLACEMENT  left knee 9/12  . TOTAL KNEE ARTHROPLASTY  06/24/2011   Procedure: TOTAL KNEE ARTHROPLASTY;  Surgeon: Gearlean Alf;  [facility];  Service: Orthopedics;  Laterality: Right;    There were no vitals filed for this visit.  Subjective Assessment - 05/14/17 1117    Subjective  Patient is doing well, he states he went to bingo yesterday and won a TEFL teacher on the first game. He states he feels he is doing well since starting therapy and he hasn't fallen since he's been doing PT.    Patient is accompained by:  -- friend Clare Gandy)    How long can you sit comfortably?  as long as I want    How long  can you stand comfortably?  at least 30 minutes as long as I have something to hold onto    How long can you walk comfortably?  walk around my house, 100 feet    Diagnostic tests  Had an MRI on lumbar region, MD discussed that he has disc degeneration    Patient Stated Goals  I'd like to get moving more and be able to walk like I used to.    Currently in Pain?  No/denies       [facility] Adult PT Treatment/Exercise - 05/14/17 0001      Ambulation/Gait   Ambulation/Gait  Yes    Assistive device  Rollator    Gait Comments  gait training for upright posture with rollator, training for safe locking/unlocking of rollator for setsed rest breaks. 2 buots x 226 feet      Lumbar Exercises: Stretches   Passive Hamstring Stretch  2 reps;30 seconds    Quad Stretch  2 reps;30 seconds    Piriformis Stretch  1 rep;30 seconds;Limitations    Piriformis Stretch Limitations  R hip more limted than L      Knee/Hip Exercises: Standing   Hip Extension  AROM;Stengthening;Both;1 set;10 reps;Knee straight;Limitations    Extension Limitations  blue therabandm cues for controlled eccentric return    Lateral Step Up  Both;1 set;10 reps;Step Height: 4";Limitations;Hand Hold: 0    Lateral Step Up Limitations  cues to look up    Forward Step Up  Both;1 set;10 reps;Step Height: 4";Hand Hold: 0    Forward Step Up Limitations  cues to look up       Balance Exercises - 05/14/17 1221      Balance Exercises: Standing   Standing Eyes Closed  Narrow base of support (BOS);Foam/compliant surface;4 reps;30 secs;Limitations perturbations for righting reactions, cues for weight shifts    Marching Limitations  2x 10 repetitions BLE with2-3 second holds on solid surface, intermitten UE support    Other Standing Exercises  forward/latera/backward cone taps attmped with LLE, patient had difficulty coordinating movment       PT Education - 05/14/17 1226    Education provided  Yes    Education Details  educated on safe use of  rollator and uprigth posture to navigate obstacles while ambulating. educated on safe righting reactions to prevent LOB.    Person(s) Educated  Patient    Methods  Explanation    Comprehension  Verbalized understanding;Need further instruction       PT Short Term Goals - 04/22/17 1352      PT SHORT TERM GOAL #1   Title  Patient will be independent with HEP to improve balance and LE muscular strength to improve functional mobility.    Time  2    Period  Weeks  Status  New    Target Date  05/06/17      PT SHORT TERM GOAL #2   Title  Patient will have improved MMT for BLE 1/2 grade to improve functional strength to progress functional independence.     Time  4    Period  Weeks    Status  New    Target Date  05/20/17      PT SHORT TERM GOAL #3   Title  Patient will improve SLS to greater than or equal to 5 seconds bilaterally to fall within age predicted norms for an 81 year old male and decrease fall risk.    Baseline  0 seconds    Time  4    Period  Weeks    Status  New      PT SHORT TERM GOAL #4   Title  Patient will perform the TUG in 14 seconds or less to decrease fall risk and progress towards age based norms for 71 year olds.    Time  8    Period  Weeks    Status  New        PT Long Term Goals - 04/22/17 1359      PT LONG TERM GOAL #1   Title  Patient will have improved MMT for BLE 1 grade to improve functional strength to progress functional independence.     Time  8    Period  Weeks    Status  New    Target Date  06/17/17      PT LONG TERM GOAL #2   Title  Patient will ambulate 100 feet greater during 3 minute walk test to demonstrate significant improvement in aerobic walking endurance.    Time  8    Period  Weeks    Status  New      PT LONG TERM GOAL #3   Title  Patient will perform the five time sit to stand in 14 seconds or less to decrease fall risk and achieve age based norms for 40 year olds.    Time  8    Period  Weeks    Status  New      PT  LONG TERM GOAL #4   Title  Patient will perform the TUG in 10 seconds or less to decrease fall risk and achieve age based norms for 53 year olds.    Time  8    Period  Weeks    Status  New       Plan - 05/14/17 1227    Clinical Impression Statement  Pateitn is continuing to progress well with therapy. he conitnues to require cues for safe use of rollator during functional transfers and gait. He has advanced balacne and functional stregthening exercises and denies pain throughout session. He will continue to benefit from skilled PT to address impairments and improve safety with dynamic balacne to reduce fall risk.    Rehab Potential  Fair    PT Frequency  2x / week    PT Duration  8 weeks    PT Treatment/Interventions  ADLs/Self Care Home Management;Gait training;Stair training;Neuromuscular re-education;Functional mobility training;Moist Heat;Cryotherapy;DME Instruction;Balance training;Therapeutic exercise;Therapeutic activities;Patient/family education;Manual techniques;Passive range of motion;Energy conservation;Taping    PT Next Visit Plan  progress with proximal strengthening exercises, incorporate functional strengthening for hips and dynamic gait challenges. Patient likes bluegrass music and may enjoy dynamic balance challenges with music for auditory cues.    PT Home Exercise Plan  initiate on first  treatment,: hip flexor/quad stretch and balance activities    Consulted and Agree with Plan of Care  Patient;Other (Comment)    Family Member Consulted  patient's friend "Clare Gandy"       Patient will benefit from skilled therapeutic intervention in order to improve the following deficits and impairments:  Abnormal gait, Decreased knowledge of use of DME, Impaired sensation, Improper body mechanics, Decreased mobility, Postural dysfunction, Decreased activity tolerance, Decreased range of motion, Decreased strength, Decreased balance, Difficulty walking, Impaired flexibility  Visit  Diagnosis: Other abnormalities of gait and mobility  Muscle weakness (generalized)  Musculoskeletal abnormal finding on examination     Problem List Patient Active Problem List   Diagnosis Date Noted  . Dysphagia 09/27/2015  . History of colonic polyps 07/28/2012  . Left sided abdominal pain 07/28/2012  . Hypothyroidism 07/28/2012  . OA (osteoarthritis) of knee 06/24/2011    Debara Pickett, PT, DPT Physical Therapist with [facility]  05/14/2017 12:30 PM    [place], [place], [address] Phone: [phone]   Fax:  [phone]  Name: Evan Miller MRN: [id number] Date of Birth: Jan 07, 1926

## 2017-05-19 ENCOUNTER — Ambulatory Visit (HOSPITAL_COMMUNITY): Payer: Medicare Other

## 2017-05-21 ENCOUNTER — Ambulatory Visit (HOSPITAL_COMMUNITY): Payer: Medicare Other

## 2017-05-21 ENCOUNTER — Telehealth (HOSPITAL_COMMUNITY): Payer: Self-pay | Admitting: Internal Medicine

## 2017-05-21 NOTE — Telephone Encounter (Signed)
05/21/17  cx via the phone tree °

## 2017-05-26 ENCOUNTER — Ambulatory Visit (HOSPITAL_COMMUNITY): Payer: Medicare Other

## 2017-05-26 ENCOUNTER — Other Ambulatory Visit: Payer: Self-pay | Admitting: General Surgery

## 2017-05-26 ENCOUNTER — Telehealth (HOSPITAL_COMMUNITY): Payer: Self-pay | Admitting: Internal Medicine

## 2017-05-26 NOTE — Telephone Encounter (Signed)
05/26/17  CX HAS TO GO HAVE SOMETHING REMOVED FROM HIS NECK

## 2017-05-28 ENCOUNTER — Ambulatory Visit (HOSPITAL_COMMUNITY): Payer: Medicare Other

## 2017-05-28 ENCOUNTER — Encounter (HOSPITAL_COMMUNITY): Payer: Self-pay

## 2017-05-28 ENCOUNTER — Other Ambulatory Visit: Payer: Self-pay

## 2017-05-28 DIAGNOSIS — R2991 Unspecified symptoms and signs involving the musculoskeletal system: Secondary | ICD-10-CM

## 2017-05-28 DIAGNOSIS — R2689 Other abnormalities of gait and mobility: Secondary | ICD-10-CM | POA: Diagnosis not present

## 2017-05-28 DIAGNOSIS — M6281 Muscle weakness (generalized): Secondary | ICD-10-CM

## 2017-05-28 NOTE — Therapy (Signed)
[place], [place], [address] Phone: [phone]   Fax:  [phone]  Physical Therapy Treatment/Re-Assessment  Patient Details  Name: Evan Miller MRN: [id number] Date of Birth: [birth year] Referring Provider: Ashok Pall MD   Encounter Date: 05/28/2017  PT End of Session - 05/28/17 1113    Visit Number  9    Number of Visits  17    Date for PT Re-Evaluation  05/20/17    Authorization Type  [employer] Medicare    Authorization Time Period  04/22/17-06/17/17    Authorization - Visit Number  1    Authorization - Number of Visits  10    PT Start Time  1115    PT Stop Time  1201    PT Time Calculation (min)  46 min    Equipment Utilized During Treatment  -- rollator    Activity Tolerance  Patient tolerated treatment well    Behavior During Therapy  [facility] for tasks assessed/performed       Past Medical History:  Diagnosis Date  . Arthritis    back,shoulders and hips  . Cancer ([place])    basal cell/ Melanoma 1997 left  shoulder/  PROSTATE  . GERD (gastroesophageal reflux disease)   . Hepatitis    age 83 years  . Hypothyroidism   . PONV (postoperative nausea and vomiting)   . Prostate CA [facility])     Past Surgical History:  Procedure Laterality Date  . CATARACT EXTRACTION W/PHACO  12/16/2011   Procedure: CATARACT EXTRACTION PHACO AND INTRAOCULAR LENS PLACEMENT (IOC);  Surgeon: Tonny Branch, MD;  Location: [facility];  Service: Ophthalmology;  Laterality: Left;  CDE:17.24   . CATARACT EXTRACTION W/PHACO  12/30/2011   Procedure: CATARACT EXTRACTION PHACO AND INTRAOCULAR LENS PLACEMENT (IOC);  Surgeon: Tonny Branch, MD;  Location: [facility];  Service: Ophthalmology;  Laterality: Right;  CDE 15.40  . COLONOSCOPY    . COLONOSCOPY N/A 08/07/2012   Procedure: COLONOSCOPY;  Surgeon: Rogene Houston, MD;  Location: [facility];  Service: Endoscopy;  Laterality: N/A;  10:00  . COLONOSCOPY N/A 11/22/2015   Procedure: COLONOSCOPY;   Surgeon: Rogene Houston, MD;  Location: [facility];  Service: Endoscopy;  Laterality: N/A;  12:00 - moved to 10:30 - Ann to notify  . ESOPHAGEAL DILATION N/A 02/10/2015   Procedure: ESOPHAGEAL DILATION;  Surgeon: Rogene Houston, MD;  [facility]: [facility];  Service: Endoscopy;  Laterality: N/A;  . ESOPHAGEAL DILATION N/A 03/29/2015   Procedure: ESOPHAGEAL DILATION;  Surgeon: Rogene Houston, MD;  [facility]: [facility];  Service: Endoscopy;  Laterality: N/A;  . ESOPHAGEAL DILATION  11/22/2015   Procedure: ESOPHAGEAL DILATION;  Surgeon: Rogene Houston, MD;  [facility]: [facility];  Service: Endoscopy;;  . ESOPHAGOGASTRODUODENOSCOPY N/A 02/10/2015   Procedure: ESOPHAGOGASTRODUODENOSCOPY (EGD);  Surgeon: Rogene Houston, MD;  [facility];  Service: Endoscopy;  Laterality: N/A;  925 - moved to 10:20 - Ann to notify pt  . ESOPHAGOGASTRODUODENOSCOPY N/A 03/29/2015   Procedure: ESOPHAGOGASTRODUODENOSCOPY (EGD);  Surgeon: Rogene Houston, MD;  [facility];  Service: Endoscopy;  Laterality: N/A;  145 - moved to 8:30 - Ann notified pt  . ESOPHAGOGASTRODUODENOSCOPY N/A 11/22/2015   Procedure: ESOPHAGOGASTRODUODENOSCOPY (EGD);  Surgeon: Rogene Houston, MD;  Location: [facility];  Service: Endoscopy;  Laterality: N/A;  . HERNIA REPAIR    . HYDROCELE EXCISION     bilateral  . JOINT REPLACEMENT  left knee 9/12  . TOTAL KNEE ARTHROPLASTY  06/24/2011   Procedure: TOTAL KNEE ARTHROPLASTY;  Surgeon: Gearlean Alf;  Location: [facility];  Service: Orthopedics;  Laterality: Right;    There were no vitals filed for this visit.  Subjective Assessment - 05/28/17 1408    Subjective  Patient is doing well, he has kept up with his HEP and Clare Gandy is still helpig him every other day around the house. He denies any fall since starting therapy and says the last one was at least 2 months ago.     Patient is accompained by:  -- friend Clare Gandy)    How long can you sit comfortably?  as long as I  want    How long can you stand comfortably?  at least 30 minutes as long as I have something to hold onto    How long can you walk comfortably?  walk around my house, 100 feet    Diagnostic tests  Had an MRI on lumbar region, MD discussed that he has disc degeneration    Patient Stated Goals  I'd like to get moving more and be able to walk like I used to.    Currently in Pain?  No/denies        [facility] PT Assessment - 05/28/17 0001      Assessment   Medical Diagnosis  Radiculopathy, lumbar region     Referring Provider  Ashok Pall MD    Next MD Visit  cancelled follow up because he doesn't need it    Prior Therapy  Had therapy for bilatearl TKA several years ago      Precautions   Precautions  Fall      Restrictions   Weight Bearing Restrictions  No      Balance Screen   Has the patient fallen in the past 6 months  No none since the start of thearpy, not in last 2 months    How many times?  3    Has the patient had a decrease in activity level because of a fear of falling?   No    Is the patient reluctant to leave their home because of a fear of falling?   No      Home Environment   Living Environment  Private residence    Living Arrangements  Spouse/significant other      Prior Function   Level of Independence  Independent;Independent with basic ADLs    Vocation  Retired      Single Leg Stance   Comments  patient performs repeated repetition, achieved 5 seconds on LLE 1/5 reps, and 3 seconds 3/5 reps on RLE      Strength   Overall Strength Comments  BUE are WFL thoughuot with exception to R shulder abduction    Right/Left Hip  Right;Left    Right Hip Flexion  4+/5    Right Hip Extension  3-/5 limited by muscle length    Right Hip ABduction  4-/5 use of TFL    Left Hip Flexion  4+/5    Left Hip Extension  3-/5 limited by muscle length    Left Hip ABduction  4/5    Right Knee Flexion  5/5    Right Knee Extension  5/5    Left Knee Flexion  5/5    Left Knee Extension   5/5    Right Ankle Dorsiflexion  5/5    Right Ankle Plantar Flexion  5/5    Left Ankle Dorsiflexion  5/5    Left Ankle Plantar Flexion  5/5      Flexibility   Soft Tissue Assessment /Muscle Length  yes    Quadriceps  positive findings with thomas test bilaterally. Patient with <50% shortening of hip flexors, ilopsoas, TFL, and quadriceps      Ambulation/Gait   Ambulation Distance (Feet)  356 Feet    Gait Pattern  Decreased stride length;Decreased hip/knee flexion - right;Shuffle;Trunk flexed;Poor foot clearance - right;Decreased trunk rotation;Decreased step length - left;Decreased step length - right;Poor foot clearance - left    Ambulation Surface  Level      6 minute walk test results    Aerobic Endurance Distance Walked  356    Endurance additional comments  wiht rollator (23mn walk)      Balance   Balance Assessed  Yes      Static Standing Balance   Static Standing - Balance Support  No upper extremity supported    Static Standing - Level of Assistance  5: Stand by assistance;4: Min assist    Static Standing Balance -  Activities   Single Leg Stance - Right Leg;Single Leg Stance - Left Leg;Romberg - Eyes Opened;Romberg - Eyes Closed    Static Standing - Comment/# of Minutes  RLE = 3 seconds/ LLE = 5 seconds (1 out 5 attempts)      Standardized Balance Assessment   Standardized Balance Assessment  Five Times Sit to Stand;Timed Up and Go Test no UE use    Five times sit to stand comments   20.34 35.64 on 11/16 with UE use      Timed Up and Go Test   Normal TUG (seconds)  20.58       [facility] Adult PT Treatment/Exercise - 05/28/17 0001      Ambulation/Gait   Ambulation/Gait  Yes    Assistive device  Rollator    Gait Comments  cues for safe locking/unlocking and proximty/management of rollator durign ambulation and transfers      Lumbar Exercises: Standing   Other Standing Lumbar Exercises  SLS 5 reps BLE       Balance Exercises - 05/28/17 1208      Balance Exercises:  Standing   Standing Eyes Closed  Narrow base of support (BOS);Foam/compliant surface;2 reps;30 secs with perturbations    Tandem Stance  Eyes open;4 reps;30 secs alternating foot position    Step Over Hurdles / Cones  2x 1 minute step over low hurdle laterally, 2x 1 minute step over low hurdle fwd/bkwd       PT Education - 05/28/17 1218    Education provided  Yes    Education Details  Educated on progress with therapy and remaining goals. Eductated on safe use of rollator during gait/transfers. Cues given for weight shifting to maintain balance    Person(s) Educated  Patient    Methods  Explanation    Comprehension  Verbalized understanding;Need further instruction       PT Short Term Goals - 05/28/17 1114      PT SHORT TERM GOAL #1   Title  Patient will be independent with HEP to improve balance and LE muscular strength to improve functional mobility.    Baseline  12/19 - reports compliance    Time  2    Period  Weeks    Status  Achieved      PT SHORT TERM GOAL #2   Title  Patient will have improved MMT for BLE 1/2 grade to improve functional strength to  progress functional independence.     Baseline  12/19 - all muscle groups tested improved by at least 1/2 grade or more    Time  4    Period  Weeks    Status  Achieved      PT SHORT TERM GOAL #3   Title  Patient will improve SLS to greater than or equal to 5 seconds bilaterally to fall within age predicted norms for an 83 year old male and decrease fall risk.    Baseline  0 seconds; 12/19 - multiple trial for 5 second on LLE and 3 seconds on RLE    Time  4    Period  Weeks    Status  Partially Met      PT SHORT TERM GOAL #4   Title  Patient will perform the TUG in 14 seconds or less to decrease fall risk and progress towards age based norms for 76 year olds.    Baseline  12/19 - 20.58 seconds with rollator (baseline was 25.45 seconds)    Time  8    Period  Weeks    Status  On-going       PT Long Term Goals - 05/28/17  1115      PT LONG TERM GOAL #1   Title  Patient will have improved MMT for BLE 1 grade to improve functional strength to progress functional independence.     Baseline  12/19 - all muscle groups improved by at least 1 or more (or by 1/5 grade if baseline was 4+/5)    Time  8    Period  Weeks    Status  Achieved      PT LONG TERM GOAL #2   Title  Patient will ambulate 100 feet greater during 3 minute walk test to demonstrate significant improvement in aerobic walking endurance.    Baseline  12/19 - patient ambulated 356';     Time  8    Period  Weeks    Status  On-going      PT LONG TERM GOAL #3   Title  Patient will perform the five time sit to stand in 14 seconds or less to decrease fall risk and achieve age based norms for 70 year olds.    Baseline  12/19 - 20.34 seconds with no UE (previously 35.64 seconds wtih UE use)    Time  8    Period  Weeks    Status  On-going      PT LONG TERM GOAL #4   Title  Patient will perform the TUG in 10 seconds or less to decrease fall risk and achieve age based norms for 24 year olds.    Baseline  12/19 - 20.58 seconds    Time  8    Period  Weeks    Status  On-going        Plan - 05/28/17 1113    Clinical Impression Statement  Re-assessment was performed today and Mr. Andon has met 2/4 short term goals and  long term goals. Regarding the goals he has not met he still made significant improvement in performance of the TUG and Five Time Sit to Stand test and is progressing towards them. He continues to require cues for safe use of rollator during functional transfers and gait and continues to have difficulty with balance activities especially in SLS. He will continue to benefit from skilled PT to address impairments and improve safety with dynamic balance and gait to reduce fall  risk and progress towards remaining goals.    Rehab Potential  Fair    PT Frequency  2x / week    PT Duration  8 weeks    PT Treatment/Interventions  ADLs/Self Care  Home Management;Gait training;Stair training;Neuromuscular re-education;Functional mobility training;Moist Heat;Cryotherapy;DME Instruction;Balance training;Therapeutic exercise;Therapeutic activities;Patient/family education;Manual techniques;Passive range of motion;Energy conservation;Taping    PT Next Visit Plan  progress with proximal strengthening exercises, incorporate functional strengthening for hips and dynamic gait challenges. Advance SLS balance challenges and hip flexor stertching. Continue with gait training using rollator for safety.    PT Home Exercise Plan  initiate on first treatment,: hip flexor/quad stretch and balance activities    Consulted and Agree with Plan of Care  Patient;Other (Comment)    Family Member Consulted  patient's friend "Clare Gandy"       Patient will benefit from skilled therapeutic intervention in order to improve the following deficits and impairments:  Abnormal gait, Decreased knowledge of use of DME, Impaired sensation, Improper body mechanics, Decreased mobility, Postural dysfunction, Decreased activity tolerance, Decreased range of motion, Decreased strength, Decreased balance, Difficulty walking, Impaired flexibility  Visit Diagnosis: Other abnormalities of gait and mobility  Muscle weakness (generalized)  Musculoskeletal abnormal finding on examination   G-Codes - 06/20/17 1428    Functional Assessment Tool Used (Outpatient Only)  Clinical judgement and measures; FOTO    Functional Limitation  Mobility: Walking and moving around    Mobility: Walking and Moving Around Current Status ([id number])  At least 20 percent but less than 40 percent impaired, limited or restricted    Mobility: Walking and Moving Around Goal Status [phone])  At least 20 percent but less than 40 percent impaired, limited or restricted       Problem List Patient Active Problem List   Diagnosis Date Noted  . Dysphagia 09/27/2015  . History of colonic polyps 07/28/2012  . Left sided  abdominal pain 07/28/2012  . Hypothyroidism 07/28/2012  . OA (osteoarthritis) of knee 06/24/2011    Kipp Brood, PT, DPT Physical Therapist with [facility]  2017/06/20 2:33 PM    [place], [place], [address] Phone: [phone]   Fax:  [phone]  Name: LORANCE PICKERAL MRN: [id number] Date of Birth: 07-Jun-1926

## 2017-06-02 ENCOUNTER — Other Ambulatory Visit: Payer: Self-pay

## 2017-06-02 ENCOUNTER — Encounter (HOSPITAL_COMMUNITY): Payer: Self-pay

## 2017-06-02 ENCOUNTER — Ambulatory Visit (HOSPITAL_COMMUNITY): Payer: Medicare Other

## 2017-06-02 DIAGNOSIS — M6281 Muscle weakness (generalized): Secondary | ICD-10-CM

## 2017-06-02 DIAGNOSIS — R2689 Other abnormalities of gait and mobility: Secondary | ICD-10-CM

## 2017-06-02 DIAGNOSIS — R2991 Unspecified symptoms and signs involving the musculoskeletal system: Secondary | ICD-10-CM

## 2017-06-02 NOTE — Therapy (Signed)
[place], [place], [address] Phone: [phone]   Fax:  [phone]  Physical Therapy Treatment  Patient Details  Name: Evan Miller MRN: [id number] Date of Birth: 1926-01-05 Referring Provider: Ashok Pall MD   Encounter Date: 06/02/2017  PT End of Session - 06/02/17 1117    Visit Number  10    Number of Visits  17    Date for PT Re-Evaluation  05/20/17    Authorization Type  [employer] Medicare    Authorization Time Period  04/22/17-06/17/17    Authorization - Visit Number  2    Authorization - Number of Visits  10    PT Start Time  1117    PT Stop Time  1159    PT Time Calculation (min)  42 min    Equipment Utilized During Treatment  Gait belt rollator    Activity Tolerance  Patient tolerated treatment well    Behavior During Therapy  [facility] for tasks assessed/performed       Past Medical History:  Diagnosis Date  . Arthritis    back,shoulders and hips  . Cancer ([place])    basal cell/ Melanoma 1997 left  shoulder/  PROSTATE  . GERD (gastroesophageal reflux disease)   . Hepatitis    age 83 years  . Hypothyroidism   . PONV (postoperative nausea and vomiting)   . Prostate CA [facility])     Past Surgical History:  Procedure Laterality Date  . CATARACT EXTRACTION W/PHACO  12/16/2011   Procedure: CATARACT EXTRACTION PHACO AND INTRAOCULAR LENS PLACEMENT (IOC);  Surgeon: Tonny Branch, MD;  Location: [facility];  Service: Ophthalmology;  Laterality: Left;  CDE:17.24   . CATARACT EXTRACTION W/PHACO  12/30/2011   Procedure: CATARACT EXTRACTION PHACO AND INTRAOCULAR LENS PLACEMENT (IOC);  Surgeon: Tonny Branch, MD;  Location: [facility];  Service: Ophthalmology;  Laterality: Right;  CDE 15.40  . COLONOSCOPY    . COLONOSCOPY N/A 08/07/2012   Procedure: COLONOSCOPY;  Surgeon: Rogene Houston, MD;  Location: [facility];  Service: Endoscopy;  Laterality: N/A;  10:00  . COLONOSCOPY N/A 11/22/2015   Procedure: COLONOSCOPY;  Surgeon:  Rogene Houston, MD;  [facility];  Service: Endoscopy;  Laterality: N/A;  12:00 - moved to 10:30 - Ann to notify  . ESOPHAGEAL DILATION N/A 02/10/2015   Procedure: ESOPHAGEAL DILATION;  Surgeon: Rogene Houston, MD;  [facility];  Service: Endoscopy;  Laterality: N/A;  . ESOPHAGEAL DILATION N/A 03/29/2015   Procedure: ESOPHAGEAL DILATION;  Surgeon: Rogene Houston, MD;  [facility];  Service: Endoscopy;  Laterality: N/A;  . ESOPHAGEAL DILATION  11/22/2015   Procedure: ESOPHAGEAL DILATION;  Surgeon: Rogene Houston, MD;  [facility];  Service: Endoscopy;;  . ESOPHAGOGASTRODUODENOSCOPY N/A 02/10/2015   Procedure: ESOPHAGOGASTRODUODENOSCOPY (EGD);  Surgeon: Rogene Houston, MD;  [facility];  Service: Endoscopy;  Laterality: N/A;  925 - moved to 10:20 - Ann to notify pt  . ESOPHAGOGASTRODUODENOSCOPY N/A 03/29/2015   Procedure: ESOPHAGOGASTRODUODENOSCOPY (EGD);  Surgeon: Rogene Houston, MD;  [facility];  Service: Endoscopy;  Laterality: N/A;  145 - moved to 8:30 - Ann notified pt  . ESOPHAGOGASTRODUODENOSCOPY N/A 11/22/2015   Procedure: ESOPHAGOGASTRODUODENOSCOPY (EGD);  Surgeon: Rogene Houston, MD;  [facility];  Service: Endoscopy;  Laterality: N/A;  . HERNIA REPAIR    . HYDROCELE EXCISION     bilateral  . JOINT REPLACEMENT  left knee 9/12  . TOTAL KNEE ARTHROPLASTY  06/24/2011   Procedure: TOTAL KNEE ARTHROPLASTY;  Surgeon: Gearlean Alf;  [facility]: [facility];  Service: Orthopedics;  Laterality: Right;    There were no vitals filed for this visit.  Subjective Assessment - 06/02/17 1117    Subjective  Patient is doing well and denies pain, no complaints. He and Clare Gandy are having lunch today as the women at church brought over [place] and sides for him to have this holiday. He is excited to go to his daughters tomorrow for Christmas.    Patient is accompained by:  -- friend Clare Gandy)    How long can you sit  comfortably?  --    How long can you stand comfortably?  --    How long can you walk comfortably?  --    Diagnostic tests  --    Patient Stated Goals  I'd like to get moving more and be able to walk like I used to.    Currently in Pain?  No/denies       [facility] Adult PT Treatment/Exercise - 06/02/17 0001      Ambulation/Gait   Ambulation/Gait  Yes    Ambulation Distance (Feet)  226 Feet 2 bouts    Assistive device  Rollator    Gait Pattern  Decreased stride length;Decreased hip/knee flexion - right;Shuffle;Trunk flexed;Poor foot clearance - right;Decreased trunk rotation;Decreased step length - left;Decreased step length - right;Poor foot clearance - left    Ambulation Surface  Level    Gait Comments  pateint ambulating with improved upright posture today, continues to require cues for safe locking and management fo rollator for seated rest breaks however demonstrated some carryover from start of session to end      Lumbar Exercises: Stretches   Active Hamstring Stretch  2 reps;60 seconds;Limitations    Active Hamstring Stretch Limitations  BLE with 12" box       Balance Exercises - 06/02/17 1136      Balance Exercises: Standing   Tandem Gait  Forward;Retro;Intermittent upper extremity support;4 reps in // bars    Sidestepping  Foam/compliant support;Limitations;4 reps 2x both directions, in // bars    Step Over Hurdles / Cones  4x fwd/bkwd/right/left over 4 low hurdles; gait belt and Min A to prevent LOB     Marching Limitations  2x 1 minute repetitions BLE with 2-3 second holds on foam/compliant surface, intermittent UE support and minA to prevent LOB    Heel Raises Limitations  20 reps with intermittent UE support    Sit to Stand Time  15 reps sit<>stand; 2 attempts on compliant surface - discontinued because too difficult    Other Standing Exercises  SLS with 3 cone taps, 10 reps BLE, intermittent UE support on solid surface        PT Education - 06/02/17 1117    Education  provided  Yes    Education Details  Edcuated on safe stepping strategies during balacne training, reinforced safe use of rollator and locking it for seated rest breaks.    Person(s) Educated  Patient    Methods  Explanation    Comprehension  Verbalized understanding;Need further instruction       PT Short Term Goals - 05/28/17 1114      PT SHORT TERM GOAL #1   Title  Patient will be independent with HEP to improve balance and LE muscular strength to improve functional mobility.    Baseline  12/19 - reports compliance  Time  2    Period  Weeks    Status  Achieved      PT SHORT TERM GOAL #2   Title  Patient will have improved MMT for BLE 1/2 grade to improve functional strength to progress functional independence.     Baseline  12/19 - all muscle groups tested improved by at least 1/2 grade or more    Time  4    Period  Weeks    Status  Achieved      PT SHORT TERM GOAL #3   Title  Patient will improve SLS to greater than or equal to 5 seconds bilaterally to fall within age predicted norms for an 81 year old male and decrease fall risk.    Baseline  0 seconds; 12/19 - multiple trial for 5 second on LLE and 3 seconds on RLE    Time  4    Period  Weeks    Status  Partially Met      PT SHORT TERM GOAL #4   Title  Patient will perform the TUG in 14 seconds or less to decrease fall risk and progress towards age based norms for 50 year olds.    Baseline  12/19 - 20.58 seconds with rollator (baseline was 25.45 seconds)    Time  8    Period  Weeks    Status  On-going        PT Long Term Goals - 05/28/17 1115      PT LONG TERM GOAL #1   Title  Patient will have improved MMT for BLE 1 grade to improve functional strength to progress functional independence.     Baseline  12/19 - all muscle groups improved by at least 1 or more (or by 1/5 grade if baseline was 4+/5)    Time  8    Period  Weeks    Status  Achieved      PT LONG TERM GOAL #2   Title  Patient will ambulate 100 feet  greater during 3 minute walk test to demonstrate significant improvement in aerobic walking endurance.    Baseline  12/19 - patient ambulated 356';     Time  8    Period  Weeks    Status  On-going      PT LONG TERM GOAL #3   Title  Patient will perform the five time sit to stand in 14 seconds or less to decrease fall risk and achieve age based norms for 31 year olds.    Baseline  12/19 - 20.34 seconds with no UE (previously 35.64 seconds wtih UE use)    Time  8    Period  Weeks    Status  On-going      PT LONG TERM GOAL #4   Title  Patient will perform the TUG in 10 seconds or less to decrease fall risk and achieve age based norms for 37 year olds.    Baseline  12/19 - 20.58 seconds    Time  8    Period  Weeks    Status  On-going         Plan - 06/02/17 1117    Clinical Impression Statement  Patient is progressing well in therapy and advance balance/dynamic gait training today. Today he had improved obstacle clearance during forward/lateral hurdles with repetition. He continues to require cues for safe use of rollator during functional transfers and gait however demonstrated some carry-over form start of session to end. He continues  to have difficulty with balance activities especially in SLS. He will continue to benefit from skilled PT to address impairments and improve safety with dynamic balance and gait to reduce fall risk and progress towards remaining goals.    Rehab Potential  Fair    PT Frequency  2x / week    PT Duration  8 weeks    PT Treatment/Interventions  ADLs/Self Care Home Management;Gait training;Stair training;Neuromuscular re-education;Functional mobility training;Moist Heat;Cryotherapy;DME Instruction;Balance training;Therapeutic exercise;Therapeutic activities;Patient/family education;Manual techniques;Passive range of motion;Energy conservation;Taping    PT Next Visit Plan  Focus on dynamic gait trianing and continue gait training using rollator for safety. Update  HEP. Progress with proximal strengthening exercises, incorporate functional strengthening for hips and dynamic gait challenges. Advance SLS balance challenges and hip flexor stertching.     PT Home Exercise Plan  initiate on first treatment,: hip flexor/quad stretch and balance activities    Consulted and Agree with Plan of Care  Patient;Other (Comment)    Family Member Consulted  patient's friend "Clare Gandy"       Patient will benefit from skilled therapeutic intervention in order to improve the following deficits and impairments:  Abnormal gait, Decreased knowledge of use of DME, Impaired sensation, Improper body mechanics, Decreased mobility, Postural dysfunction, Decreased activity tolerance, Decreased range of motion, Decreased strength, Decreased balance, Difficulty walking, Impaired flexibility  Visit Diagnosis: Other abnormalities of gait and mobility  Muscle weakness (generalized)  Musculoskeletal abnormal finding on examination     Problem List Patient Active Problem List   Diagnosis Date Noted  . Dysphagia 09/27/2015  . History of colonic polyps 07/28/2012  . Left sided abdominal pain 07/28/2012  . Hypothyroidism 07/28/2012  . OA (osteoarthritis) of knee 06/24/2011    Kipp Brood, PT, DPT Physical Therapist with [facility]  06/02/2017 12:16 PM    [place], [place], [address] Phone: [phone]   Fax:  [phone]  Name: ANTHEM FRAZER MRN: [id number] Date of Birth: 25-Nov-1925

## 2017-06-04 ENCOUNTER — Encounter (HOSPITAL_BASED_OUTPATIENT_CLINIC_OR_DEPARTMENT_OTHER): Payer: Self-pay | Admitting: *Deleted

## 2017-06-04 ENCOUNTER — Ambulatory Visit (HOSPITAL_COMMUNITY): Payer: Medicare Other

## 2017-06-04 DIAGNOSIS — R2991 Unspecified symptoms and signs involving the musculoskeletal system: Secondary | ICD-10-CM

## 2017-06-04 DIAGNOSIS — M6281 Muscle weakness (generalized): Secondary | ICD-10-CM

## 2017-06-04 DIAGNOSIS — R2689 Other abnormalities of gait and mobility: Secondary | ICD-10-CM

## 2017-06-04 NOTE — Patient Instructions (Signed)
   TANDEM STANCE BALANCE AT COUNTER: 2-3 times for 30 seconds  Stand and balnace in tandem stance heel to toe with one foot in front of the other). Hold onto a counter at each side so you have a safe place to grab for balance. Hold this position. Relax and repeat.      SINGLE LEG STANCE - SLS: 2-3 times for 30 seconds  Stand on one leg and maintain your balance. Perform at kitchen counter with one in front of you or one on each side so you can hold on for support when needed.

## 2017-06-04 NOTE — Therapy (Signed)
[place], [place], [address] Phone: [phone]   Fax:  [phone]  Physical Therapy Treatment/Discharge Summary  Patient Details  Name: Evan Miller MRN: [id number] Date of Birth: 05-Sep-1925 Referring Provider: Ashok Pall, MD   Encounter Date: 06/04/2017  PT End of Session - 06/04/17 1118    Visit Number  11    Number of Visits  17    Date for PT Re-Evaluation  05/20/17    Authorization Type  [employer]HC Medicare    Authorization Time Period  04/22/17-06/17/17    Authorization - Visit Number  3    Authorization - Number of Visits  10    PT Start Time  5809    PT Stop Time  1158    PT Time Calculation (min)  40 min    Equipment Utilized During Treatment  Gait belt rollator    Activity Tolerance  Patient tolerated treatment well    Behavior During Therapy  WFL for tasks assessed/performed       Past Medical History:  Diagnosis Date  . Arthritis    back,shoulders and hips  . Cancer ([place])    basal cell/ Melanoma 1997 left  shoulder/  PROSTATE  . GERD (gastroesophageal reflux disease)   . Hepatitis    age 48 years  . Hypothyroidism   . PONV (postoperative nausea and vomiting)   . Prostate CA [facility])     Past Surgical History:  Procedure Laterality Date  . CATARACT EXTRACTION W/PHACO  12/16/2011   Procedure: CATARACT EXTRACTION PHACO AND INTRAOCULAR LENS PLACEMENT (IOC);  Surgeon: Tonny Branch, MD;  [facility];  Service: Ophthalmology;  Laterality: Left;  CDE:17.24   . CATARACT EXTRACTION W/PHACO  12/30/2011   Procedure: CATARACT EXTRACTION PHACO AND INTRAOCULAR LENS PLACEMENT (IOC);  Surgeon: Tonny Branch, MD;  Location: [facility];  Service: Ophthalmology;  Laterality: Right;  CDE 15.40  . COLONOSCOPY    . COLONOSCOPY N/A 08/07/2012   Procedure: COLONOSCOPY;  Surgeon: Rogene Houston, MD;  [facility];  Service: Endoscopy;  Laterality: N/A;  10:00  . COLONOSCOPY N/A 11/22/2015   Procedure:  COLONOSCOPY;  Surgeon: Rogene Houston, MD;  [facility];  Service: Endoscopy;  Laterality: N/A;  12:00 - moved to 10:30 - Ann to notify  . ESOPHAGEAL DILATION N/A 02/10/2015   Procedure: ESOPHAGEAL DILATION;  Surgeon: Rogene Houston, MD;  [facility];  Service: Endoscopy;  Laterality: N/A;  . ESOPHAGEAL DILATION N/A 03/29/2015   Procedure: ESOPHAGEAL DILATION;  Surgeon: Rogene Houston, MD;  [facility];  Service: Endoscopy;  Laterality: N/A;  . ESOPHAGEAL DILATION  11/22/2015   Procedure: ESOPHAGEAL DILATION;  Surgeon: Rogene Houston, MD;  [facility];  Service: Endoscopy;;  . ESOPHAGOGASTRODUODENOSCOPY N/A 02/10/2015   Procedure: ESOPHAGOGASTRODUODENOSCOPY (EGD);  Surgeon: Rogene Houston, MD;  [facility];  Service: Endoscopy;  Laterality: N/A;  925 - moved to 10:20 - Ann to notify pt  . ESOPHAGOGASTRODUODENOSCOPY N/A 03/29/2015   Procedure: ESOPHAGOGASTRODUODENOSCOPY (EGD);  Surgeon: Rogene Houston, MD;  [facility];  Service: Endoscopy;  Laterality: N/A;  145 - moved to 8:30 - Ann notified pt  . ESOPHAGOGASTRODUODENOSCOPY N/A 11/22/2015   Procedure: ESOPHAGOGASTRODUODENOSCOPY (EGD);  Surgeon: Rogene Houston, MD;  [facility];  Service: Endoscopy;  Laterality: N/A;  . HERNIA REPAIR    . HYDROCELE EXCISION     bilateral  . JOINT REPLACEMENT  left knee 9/12  . TOTAL KNEE ARTHROPLASTY  06/24/2011   Procedure: TOTAL KNEE ARTHROPLASTY;  Surgeon: Gearlean Alf;  [facility];  Service: Orthopedics;  Laterality: Right;    There were no vitals filed for this visit.   [facility] PT Assessment - 06/04/17 0001      Assessment   Medical Diagnosis  Radiculopathy, lumbar region     Referring Provider  Ashok Pall, MD    Next MD Visit  cancelled follow up because he doesn't need it    Prior Therapy  Had therapy for bilatearl TKA several years ago      Ambulation/Gait   Ambulation/Gait  Yes    Ambulation  Distance (Feet)  360 Feet    Assistive device  None    Gait Pattern  Decreased stride length;Decreased hip/knee flexion - right;Shuffle;Trunk flexed;Poor foot clearance - right;Decreased trunk rotation;Decreased step length - left;Decreased step length - right;Poor foot clearance - left    Ambulation Surface  Level    Gait Comments  pateint ambulating with improved upright posture overall but requires some cues to look up for safety      6 minute walk test results    Aerobic Endurance Distance Walked  360    Endurance additional comments  with no assistive device      Static Standing Balance   Static Standing - Balance Support  No upper extremity supported    Static Standing - Level of Assistance  5: Stand by assistance    Static Standing Balance -  Activities   Single Leg Stance - Right Leg;Single Leg Stance - Left Leg;Romberg - Eyes Opened;Romberg - Eyes Closed    Static Standing - Comment/# of Minutes  RLE = 20 seconds; LLE = 13 seconds - pateitn demosntrated safe stepping strategies to prevent LOB      Standardized Balance Assessment   Standardized Balance Assessment  Five Times Sit to Stand;Timed Up and Go Test    Five times sit to stand comments   13.94 was 20.34 on 12/19      Timed Up and Go Test   Normal TUG (seconds)  19.7 was 20.58 on 12/19    TUG Comments  without rollator/AD       [facility] Adult PT Treatment/Exercise - 06/04/17 0001      Knee/Hip Exercises: Standing   Lateral Step Up  Both;1 set;10 reps;Limitations;Hand Hold: 0;Step Height: 6"    Lateral Step Up Limitations  cues to look up    Forward Step Up  Both;1 set;10 reps;Hand Hold: 0;Step Height: 8"    Forward Step Up Limitations  cues to look up      Knee/Hip Exercises: Seated   Sit to Sand  5 reps 13.94 second 5x s<>s       Balance Exercises - 06/04/17 1214      Balance Exercises: Standing   Tandem Stance  Eyes open;4 reps;30 secs alterante foot position       PT Education - 06/04/17 1214    Education  provided  Yes    Education Details  Educated on progress with goals and progression of POC. Discussed feelings about discharging and patient staet he is confidend he can manage independently. Reveiwed safety with balance exercises.    Person(s) Educated  Patient    Methods  Explanation;Handout    Comprehension  Verbalized understanding;Returned demonstration       PT Short Term Goals - 06/04/17 1216      PT SHORT TERM GOAL #1  Title  Patient will be independent with HEP to improve balance and LE muscular strength to improve functional mobility.    Baseline  12/19 - reports compliance    Time  2    Period  Weeks    Status  Achieved      PT SHORT TERM GOAL #2   Title  Patient will have improved MMT for BLE 1/2 grade to improve functional strength to progress functional independence.     Baseline  12/19 - all muscle groups tested improved by at least 1/2 grade or more    Time  4    Period  Weeks    Status  Achieved      PT SHORT TERM GOAL #3   Title  Patient will improve SLS to greater than or equal to 5 seconds bilaterally to fall within age predicted norms for an 81 year old male and decrease fall risk.    Baseline  0 seconds; 12/19 - multiple trial for 5 second on LLE and 3 seconds on RLE' 12/26 - LLE = 13 seconds, RLE = 20 seconds    Time  4    Period  Weeks    Status  Achieved      PT SHORT TERM GOAL #4   Title  Patient will perform the TUG in 14 seconds or less to decrease fall risk and progress towards age based norms for 29 year olds.    Baseline  12/19 - 20.58 seconds with rollator (baseline was 25.45 seconds); 12/26 - 13 seconds    Time  8    Period  Weeks    Status  Achieved        PT Long Term Goals - 06/04/17 1217      PT LONG TERM GOAL #1   Title  Patient will have improved MMT for BLE 1 grade to improve functional strength to progress functional independence.     Baseline  12/19 - all muscle groups improved by at least 1 or more (or by 1/5 grade if baseline  was 4+/5)    Time  8    Period  Weeks    Status  Achieved      PT LONG TERM GOAL #2   Title  Patient will ambulate 100 feet greater during 3 minute walk test to demonstrate significant improvement in aerobic walking endurance.    Baseline  12/19 - patient ambulated 356'; 12/26 - 360' without AD    Time  8    Period  Weeks    Status  On-going      PT LONG TERM GOAL #3   Title  Patient will perform the five time sit to stand in 14 seconds or less to decrease fall risk and achieve age based norms for 12 year olds.    Baseline  12/19 - 20.34 seconds with no UE (previously 35.64 seconds wtih UE use); 12/26 - 19 seconds    Time  8    Period  Weeks    Status  On-going      PT LONG TERM GOAL #4   Title  Patient will perform the TUG in 10 seconds or less to decrease fall risk and achieve age based norms for 33 year olds.    Baseline  12/19 - 20.58 seconds; 12/26 - 13 seconds    Time  8    Period  Weeks    Status  On-going       Plan - 06/04/17 1218  Clinical Impression Statement  Mr. Stancil has met 4/4 short term goals and 1/4 long term goals. Regarding the goals he has not met he still made significant improvement in performance of the TUG, Five Time Sit to Stand, and single limb stance time. Today he demonstrated good carryover from prior session with improved obstacle clearance during forward/lateral hurdles and cone taps in single limb stance. He has demonstrated consistent compliance with his HEP and I reviewed safety for his new balance exercises. He denies any falls for last ~3 months and his 5x sit to stand demonstrates decreased fall risk. Mr. Zandyr is ready to discharge from physical therapy and has stated he feels confident he can continue to progress with the exercises and tools provided by therapist. I will discharge he patient today.    Rehab Potential  Fair    PT Frequency  2x / week    PT Duration  8 weeks    PT Treatment/Interventions  ADLs/Self Care Home Management;Gait  training;Stair training;Neuromuscular re-education;Functional mobility training;Moist Heat;Cryotherapy;DME Instruction;Balance training;Therapeutic exercise;Therapeutic activities;Patient/family education;Manual techniques;Passive range of motion;Energy conservation;Taping    PT Next Visit Plan  Discharging    PT Home Exercise Plan  initiate on first treatment,: hip flexor/quad stretch and balance activities; 12/26 - tandem at counter, SLS at counter    Consulted and Agree with Plan of Care  Patient;Other (Comment)    Family Member Consulted  patient's friend "Clare Gandy"       Patient will benefit from skilled therapeutic intervention in order to improve the following deficits and impairments:  Abnormal gait, Decreased knowledge of use of DME, Impaired sensation, Improper body mechanics, Decreased mobility, Postural dysfunction, Decreased activity tolerance, Decreased range of motion, Decreased strength, Decreased balance, Difficulty walking, Impaired flexibility  Visit Diagnosis: Other abnormalities of gait and mobility  Muscle weakness (generalized)  Musculoskeletal abnormal finding on examination     Problem List Patient Active Problem List   Diagnosis Date Noted  . Dysphagia 09/27/2015  . History of colonic polyps 07/28/2012  . Left sided abdominal pain 07/28/2012  . Hypothyroidism 07/28/2012  . OA (osteoarthritis) of knee 06/24/2011      PHYSICAL THERAPY DISCHARGE SUMMARY  Visits from Start of Care: 11  Current functional level related to goals / functional outcomes: See above for details   Remaining deficits: See above for details   Education / Equipment: Educated on progress with goals and progression of POC. Discussed feelings about discharging and patient staet he is confidend he can manage independently. Reveiwed safety with balance exercises.   Plan: Patient agrees to discharge.  Patient goals were partially met. Patient is being discharged due to meeting the stated  rehab goals.  ?????      Kipp Brood, PT, DPT Physical Therapist with [facility]  06/04/2017 12:25 PM   [place], [place], [address] Phone: [phone]   Fax:  [phone]  Name: WILDON CUEVAS MRN: [id number] Date of Birth: March 26, 1926

## 2017-06-11 ENCOUNTER — Encounter (HOSPITAL_BASED_OUTPATIENT_CLINIC_OR_DEPARTMENT_OTHER)
Admission: RE | Disposition: A | Discharge status: Home / Self Care | Payer: Self-pay | Source: Ambulatory Visit | Attending: General Surgery

## 2017-06-11 ENCOUNTER — Ambulatory Visit (HOSPITAL_BASED_OUTPATIENT_CLINIC_OR_DEPARTMENT_OTHER): Payer: Medicare Other | Admitting: Certified Registered"

## 2017-06-11 ENCOUNTER — Encounter (HOSPITAL_BASED_OUTPATIENT_CLINIC_OR_DEPARTMENT_OTHER): Payer: Self-pay | Admitting: Certified Registered"

## 2017-06-11 ENCOUNTER — Ambulatory Visit (HOSPITAL_BASED_OUTPATIENT_CLINIC_OR_DEPARTMENT_OTHER)
Admission: RE | Admit: 2017-06-11 | Discharge: 2017-06-11 | Disposition: A | Discharge status: Home / Self Care | Payer: Medicare Other | Source: Ambulatory Visit | Attending: General Surgery | Admitting: General Surgery

## 2017-06-11 DIAGNOSIS — Z8042 Family history of malignant neoplasm of prostate: Secondary | ICD-10-CM | POA: Diagnosis not present

## 2017-06-11 DIAGNOSIS — Z79899 Other long term (current) drug therapy: Secondary | ICD-10-CM | POA: Diagnosis not present

## 2017-06-11 DIAGNOSIS — Z8261 Family history of arthritis: Secondary | ICD-10-CM | POA: Insufficient documentation

## 2017-06-11 DIAGNOSIS — Z9841 Cataract extraction status, right eye: Secondary | ICD-10-CM | POA: Diagnosis not present

## 2017-06-11 DIAGNOSIS — Z833 Family history of diabetes mellitus: Secondary | ICD-10-CM | POA: Insufficient documentation

## 2017-06-11 DIAGNOSIS — Z8349 Family history of other endocrine, nutritional and metabolic diseases: Secondary | ICD-10-CM | POA: Diagnosis not present

## 2017-06-11 DIAGNOSIS — M1990 Unspecified osteoarthritis, unspecified site: Secondary | ICD-10-CM | POA: Diagnosis not present

## 2017-06-11 DIAGNOSIS — E039 Hypothyroidism, unspecified: Secondary | ICD-10-CM | POA: Insufficient documentation

## 2017-06-11 DIAGNOSIS — Z85820 Personal history of malignant melanoma of skin: Secondary | ICD-10-CM | POA: Diagnosis not present

## 2017-06-11 DIAGNOSIS — Z803 Family history of malignant neoplasm of breast: Secondary | ICD-10-CM | POA: Diagnosis not present

## 2017-06-11 DIAGNOSIS — C434 Malignant melanoma of scalp and neck: Secondary | ICD-10-CM | POA: Insufficient documentation

## 2017-06-11 DIAGNOSIS — K219 Gastro-esophageal reflux disease without esophagitis: Secondary | ICD-10-CM | POA: Diagnosis not present

## 2017-06-11 DIAGNOSIS — Z8249 Family history of ischemic heart disease and other diseases of the circulatory system: Secondary | ICD-10-CM | POA: Diagnosis not present

## 2017-06-11 DIAGNOSIS — Z880 Allergy status to penicillin: Secondary | ICD-10-CM | POA: Diagnosis not present

## 2017-06-11 DIAGNOSIS — Z882 Allergy status to sulfonamides status: Secondary | ICD-10-CM | POA: Diagnosis not present

## 2017-06-11 DIAGNOSIS — Z809 Family history of malignant neoplasm, unspecified: Secondary | ICD-10-CM | POA: Insufficient documentation

## 2017-06-11 DIAGNOSIS — Z8546 Personal history of malignant neoplasm of prostate: Secondary | ICD-10-CM | POA: Insufficient documentation

## 2017-06-11 DIAGNOSIS — Z7982 Long term (current) use of aspirin: Secondary | ICD-10-CM | POA: Insufficient documentation

## 2017-06-11 DIAGNOSIS — Z9842 Cataract extraction status, left eye: Secondary | ICD-10-CM | POA: Diagnosis not present

## 2017-06-11 DIAGNOSIS — Z87891 Personal history of nicotine dependence: Secondary | ICD-10-CM | POA: Diagnosis not present

## 2017-06-11 DIAGNOSIS — Z811 Family history of alcohol abuse and dependence: Secondary | ICD-10-CM | POA: Insufficient documentation

## 2017-06-11 HISTORY — PX: MELANOMA EXCISION: SHX5266

## 2017-06-11 LAB — CBC WITH DIFFERENTIAL/PLATELET
Basophils Absolute: 0.1 10*3/uL (ref 0.0–0.1)
Basophils Relative: 1 %
EOS PCT: 9 %
Eosinophils Absolute: 0.6 10*3/uL (ref 0.0–0.7)
HEMATOCRIT: 39.8 % (ref 39.0–52.0)
HEMOGLOBIN: 13.5 g/dL (ref 13.0–17.0)
Lymphocytes Relative: 31 %
Lymphs Abs: 2 10*3/uL (ref 0.7–4.0)
MCH: 30.5 pg (ref 26.0–34.0)
MCHC: 33.9 g/dL (ref 30.0–36.0)
MCV: 90 fL (ref 78.0–100.0)
MONOS PCT: 9 %
Monocytes Absolute: 0.6 10*3/uL (ref 0.1–1.0)
NEUTROS ABS: 3.2 10*3/uL (ref 1.7–7.7)
Neutrophils Relative %: 50 %
Platelets: 172 10*3/uL (ref 150–400)
RBC: 4.42 MIL/uL (ref 4.22–5.81)
RDW: 14.3 % (ref 11.5–15.5)
WBC: 6.5 10*3/uL (ref 4.0–10.5)

## 2017-06-11 LAB — COMPREHENSIVE METABOLIC PANEL
ALK PHOS: 70 U/L (ref 38–126)
ALT: 22 U/L (ref 17–63)
AST: 41 U/L (ref 15–41)
Albumin: 4.2 g/dL (ref 3.5–5.0)
Anion gap: 9 (ref 5–15)
BILIRUBIN TOTAL: 1.1 mg/dL (ref 0.3–1.2)
BUN: 19 mg/dL (ref 6–20)
CALCIUM: 9.6 mg/dL (ref 8.9–10.3)
CO2: 27 mmol/L (ref 22–32)
CREATININE: 1.1 mg/dL (ref 0.61–1.24)
Chloride: 102 mmol/L (ref 101–111)
GFR calc Af Amer: >60 mL/min (ref >60)
GFR, EST NON AFRICAN AMERICAN: 57 mL/min — AB (ref >60)
Glucose, Bld: 103 mg/dL — ABNORMAL HIGH (ref 65–99)
Potassium: 4.6 mmol/L (ref 3.5–5.1)
Sodium: 138 mmol/L (ref 135–145)
TOTAL PROTEIN: 7.2 g/dL (ref 6.5–8.1)

## 2017-06-11 LAB — PROTIME-INR
INR: 0.9
PROTHROMBIN TIME: 12.1 s (ref 11.4–15.2)

## 2017-06-11 SURGERY — EXCISION, MELANOMA
Anesthesia: General | Site: Neck

## 2017-06-11 MED ORDER — BUPIVACAINE HCL 0.25 % IJ SOLN
INTRAMUSCULAR | Status: DC | PRN
  Administered 2017-06-11: 18 mL

## 2017-06-11 MED ORDER — ACETAMINOPHEN 500 MG PO TABS
1000.0000 mg | ORAL_TABLET | ORAL | Status: AC
  Administered 2017-06-11: 1000 mg via ORAL

## 2017-06-11 MED ORDER — MIDAZOLAM HCL 2 MG/2ML IJ SOLN: 1.0000 mg | INTRAMUSCULAR | Status: DC | PRN

## 2017-06-11 MED ORDER — ACETAMINOPHEN 500 MG PO TABS
ORAL_TABLET | ORAL | Status: AC
  Filled 2017-06-11: qty 2

## 2017-06-11 MED ORDER — CIPROFLOXACIN IN D5W 400 MG/200ML IV SOLN
400.0000 mg | INTRAVENOUS | Status: AC
  Administered 2017-06-11: 400 mg via INTRAVENOUS

## 2017-06-11 MED ORDER — SUCCINYLCHOLINE CHLORIDE 200 MG/10ML IV SOSY
PREFILLED_SYRINGE | INTRAVENOUS | Status: AC
  Filled 2017-06-11: qty 10

## 2017-06-11 MED ORDER — GABAPENTIN 300 MG PO CAPS
300.0000 mg | ORAL_CAPSULE | ORAL | Status: AC
  Administered 2017-06-11: 300 mg via ORAL

## 2017-06-11 MED ORDER — SUCCINYLCHOLINE CHLORIDE 20 MG/ML IJ SOLN
INTRAMUSCULAR | Status: DC | PRN
  Administered 2017-06-11: 80 mg via INTRAVENOUS

## 2017-06-11 MED ORDER — LACTATED RINGERS IV SOLN
INTRAVENOUS | Status: DC
  Administered 2017-06-11 (×2): via INTRAVENOUS

## 2017-06-11 MED ORDER — PROPOFOL 10 MG/ML IV BOLUS
INTRAVENOUS | Status: DC | PRN
  Administered 2017-06-11: 150 mg via INTRAVENOUS

## 2017-06-11 MED ORDER — CIPROFLOXACIN IN D5W 400 MG/200ML IV SOLN
INTRAVENOUS | Status: AC
  Filled 2017-06-11: qty 200

## 2017-06-11 MED ORDER — LIDOCAINE 2% (20 MG/ML) 5 ML SYRINGE
INTRAMUSCULAR | Status: AC
  Filled 2017-06-11: qty 20

## 2017-06-11 MED ORDER — HYDROCODONE-ACETAMINOPHEN 5-325 MG PO TABS: 1.0000 | ORAL_TABLET | Freq: Four times a day (QID) | ORAL | 0 refills | Status: DC | PRN

## 2017-06-11 MED ORDER — ONDANSETRON HCL 4 MG/2ML IJ SOLN
INTRAMUSCULAR | Status: AC
  Filled 2017-06-11: qty 10

## 2017-06-11 MED ORDER — CHLORHEXIDINE GLUCONATE CLOTH 2 % EX PADS: 6.0000 | MEDICATED_PAD | Freq: Once | CUTANEOUS | Status: DC

## 2017-06-11 MED ORDER — GABAPENTIN 300 MG PO CAPS
ORAL_CAPSULE | ORAL | Status: AC
  Filled 2017-06-11: qty 1

## 2017-06-11 MED ORDER — PROPOFOL 500 MG/50ML IV EMUL
INTRAVENOUS | Status: AC
  Filled 2017-06-11: qty 50

## 2017-06-11 MED ORDER — LIDOCAINE HCL (CARDIAC) 20 MG/ML IV SOLN
INTRAVENOUS | Status: DC | PRN
  Administered 2017-06-11: 60 mg via INTRAVENOUS

## 2017-06-11 MED ORDER — DEXAMETHASONE SODIUM PHOSPHATE 4 MG/ML IJ SOLN
INTRAMUSCULAR | Status: DC | PRN
  Administered 2017-06-11: 6 mg via INTRAVENOUS

## 2017-06-11 MED ORDER — SCOPOLAMINE 1 MG/3DAYS TD PT72: 1.0000 | MEDICATED_PATCH | Freq: Once | TRANSDERMAL | Status: DC | PRN

## 2017-06-11 MED ORDER — FENTANYL CITRATE (PF) 100 MCG/2ML IJ SOLN
50.0000 ug | INTRAMUSCULAR | Status: DC | PRN
  Administered 2017-06-11: 50 ug via INTRAVENOUS

## 2017-06-11 MED ORDER — FENTANYL CITRATE (PF) 100 MCG/2ML IJ SOLN
INTRAMUSCULAR | Status: AC
  Filled 2017-06-11: qty 2

## 2017-06-11 MED ORDER — DEXAMETHASONE SODIUM PHOSPHATE 10 MG/ML IJ SOLN
INTRAMUSCULAR | Status: AC
  Filled 2017-06-11: qty 3

## 2017-06-11 MED ORDER — ONDANSETRON HCL 4 MG/2ML IJ SOLN
INTRAMUSCULAR | Status: DC | PRN
  Administered 2017-06-11: 4 mg via INTRAVENOUS

## 2017-06-11 SURGICAL SUPPLY — 39 items
BLADE HEX COATED 2.75 (ELECTRODE) ×3 IMPLANT
BLADE SURG 10 STRL SS (BLADE) ×3 IMPLANT
BLADE SURG 15 STRL LF DISP TIS (BLADE) ×1 IMPLANT
BLADE SURG 15 STRL SS (BLADE) ×2
CANISTER SUCT 1200ML W/VALVE (MISCELLANEOUS) ×3 IMPLANT
CLOSURE WOUND 1/2 X4 (GAUZE/BANDAGES/DRESSINGS) ×1
DERMABOND ADVANCED (GAUZE/BANDAGES/DRESSINGS) ×2
DERMABOND ADVANCED .7 DNX12 (GAUZE/BANDAGES/DRESSINGS) ×1 IMPLANT
DRAPE LAPAROTOMY 100X72 PEDS (DRAPES) ×3 IMPLANT
DRAPE UTILITY XL STRL (DRAPES) ×3 IMPLANT
ELECT REM PT RETURN 9FT ADLT (ELECTROSURGICAL) ×3
ELECTRODE REM PT RTRN 9FT ADLT (ELECTROSURGICAL) ×1 IMPLANT
GAUZE SPONGE 4X4 12PLY STRL LF (GAUZE/BANDAGES/DRESSINGS) ×3 IMPLANT
GLOVE BIO SURGEON STRL SZ 6 (GLOVE) ×3 IMPLANT
GLOVE BIOGEL PI IND STRL 6.5 (GLOVE) ×1 IMPLANT
GLOVE BIOGEL PI INDICATOR 6.5 (GLOVE) ×2
GOWN STRL REUS W/ TWL LRG LVL3 (GOWN DISPOSABLE) ×1 IMPLANT
GOWN STRL REUS W/TWL 2XL LVL3 (GOWN DISPOSABLE) ×3 IMPLANT
GOWN STRL REUS W/TWL LRG LVL3 (GOWN DISPOSABLE) ×2
NEEDLE HYPO 25X1 1.5 SAFETY (NEEDLE) ×3 IMPLANT
NS IRRIG 1000ML POUR BTL (IV SOLUTION) IMPLANT
PACK BASIN DAY SURGERY FS (CUSTOM PROCEDURE TRAY) ×3 IMPLANT
PACK UNIVERSAL I (CUSTOM PROCEDURE TRAY) ×3 IMPLANT
PENCIL BUTTON HOLSTER BLD 10FT (ELECTRODE) ×3 IMPLANT
SLEEVE SCD COMPRESS KNEE MED (MISCELLANEOUS) ×3 IMPLANT
SPONGE LAP 18X18 X RAY DECT (DISPOSABLE) ×3 IMPLANT
STRIP CLOSURE SKIN 1/2X4 (GAUZE/BANDAGES/DRESSINGS) ×2 IMPLANT
SUT MNCRL AB 4-0 PS2 18 (SUTURE) ×3 IMPLANT
SUT VIC AB 2-0 SH 27 (SUTURE) ×4
SUT VIC AB 2-0 SH 27XBRD (SUTURE) ×2 IMPLANT
SUT VIC AB 3-0 SH 27 (SUTURE) ×4
SUT VIC AB 3-0 SH 27X BRD (SUTURE) ×2 IMPLANT
SUT VICRYL 4-0 PS2 18IN ABS (SUTURE) ×3 IMPLANT
SYR CONTROL 10ML LL (SYRINGE) ×3 IMPLANT
TOWEL OR 17X24 6PK STRL BLUE (TOWEL DISPOSABLE) ×3 IMPLANT
TOWEL OR NON WOVEN STRL DISP B (DISPOSABLE) ×3 IMPLANT
TUBE CONNECTING 20'X1/4 (TUBING) ×1
TUBE CONNECTING 20X1/4 (TUBING) ×2 IMPLANT
YANKAUER SUCT BULB TIP NO VENT (SUCTIONS) ×3 IMPLANT

## 2017-06-11 NOTE — Anesthesia Procedure Notes (Signed)
Procedure Name: Intubation Date/Time: 06/11/2017 8:40 AM Performed by: Signe Colt, CRNA Pre-anesthesia Checklist: Patient identified, Emergency Drugs available, Suction available and Patient being monitored Patient Re-evaluated:Patient Re-evaluated prior to induction Oxygen Delivery Method: Circle system utilized Preoxygenation: Pre-oxygenation with 100% oxygen Induction Type: IV induction Ventilation: Mask ventilation without difficulty Laryngoscope Size: Mac and 3 Grade View: Grade I Tube type: Oral Number of attempts: 1 Airway Equipment and Method: Stylet and Oral airway Placement Confirmation: ETT inserted through vocal cords under direct vision,  positive ETCO2 and breath sounds checked- equal and bilateral Tube secured with: Tape Dental Injury: Teeth and Oropharynx as per pre-operative assessment

## 2017-06-11 NOTE — Op Note (Signed)
PRE-OPERATIVE DIAGNOSIS: cT1a left neck melanoma  POST-OPERATIVE DIAGNOSIS:  Same  PROCEDURE:  Procedure(s): Wide local excision 1 cm margins, advancement flap closure for defect 5 cm x 3 cm  SURGEON:  Surgeon(s): Stark Klein, MD  ANESTHESIA:   local and general  DRAINS: none   LOCAL MEDICATIONS USED:  MARCAINE    and XYLOCAINE   SPECIMEN:  Source of Specimen:  wide local excision left neck melanoma   FINDINGS:  Grossly clear m DISPOSITION OF SPECIMEN:  PATHOLOGY  COUNTS:  YES  PLAN OF CARE: Discharge to home after PACU  PATIENT DISPOSITION:  PACU - hemodynamically stable.    PROCEDURE:   Pt was identified in the holding area, taken to the OR, and placed supine on the OR table.  General anesthesia was induced.  Time out was performed according to the surgical safety checklist.  When all was correct, we continued.    The patient's left neck was prepped and draped in sterile fashion.   The melanoma was identified and 1 cm margins were marked out.  18 mL local was administered under the melanoma and the adjacent tissue.  A #10 blade was used to incise the skin around the melanoma.  The cautery was used to take the dissection down to the fascia.  The skin was marked in situ with silk suture.  The cautery was used to take the specimen off the fascia, and it was passed off the table.    Penetrating towel clips were used to elevate the edges of the incision and the skin was freed up in all directions.  This was pulled together in an oblique orientation. The redundant tissue was taken off both corners to make an ellipse with sharp tissue scissors.  These were marked as well.  The skin was pulled together and held with penetrating towel clips. Deep interrupted 2-0 vicryl sutures were placed to relieve tension.  The skin was then reapproximated with 3-0 interrupted vicryl deep dermal sutures and 4-0 monocryl running subcuticular sutures.  The wound was dressed with Benzoin, steristrips,  gauze, and tegaderm.    Needle, sponge, and instrument counts were correct.  The patient was awakened from anesthesia and taken to the PACU in stable condition.

## 2017-06-11 NOTE — Interval H&P Note (Signed)
History and Physical Interval Note:  06/11/2017 8:23 AM  Evan Miller  has presented today for surgery, with the diagnosis of LEFT NECK MELANOMA  The various methods of treatment have been discussed with the patient and family. After consideration of risks, benefits and other options for treatment, the patient has consented to  Procedure(s) with comments: [place] (N/A) - GENERAL AND LOCAL as a surgical intervention .  The patient's history has been reviewed, patient examined, no change in status, stable for surgery.  I have reviewed the patient's chart and labs.  Questions were answered to the patient's satisfaction.     Stark Klein

## 2017-06-11 NOTE — H&P (Signed)
Evan Miller Documented: 05/26/2017 2:10 PM Location: [place] Office Patient #: [id number] DOB: [birth year] Married / Language: English / Race: White Male   History of Present Illness Stark Klein MD; 05/26/2017 2:35 PM) The patient is a 84 year old male who presents with malignant melanoma. Patient is an 84 year old male referred for a new diagnosis of melanoma of the left neck. The patient has had a previous melanoma on his left shoulder in the 1990s. he does not recall having any bleeding or itching of this lesion on his neck. He denies pain. He states that Dr. Renda Rolls found it. He had a biopsy of his chest and one on the neck. The chest was a basal cell carcinoma. The neck was a malignant melanoma that is lentigo maligna. This is 0.85 mm with positive peripheral and deep margins. No satellitosis is noted. No LV A or neurotrophic zone was seen. He did not have any tumor infiltrating lymphocytes. it is not clear if he had ulceration or not. In one site it says there is no ulceration, but within the staging, it is called a pT1b. 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   Past Surgical History (April Staton, [place]; 05/26/2017 2:14 PM) Cataract Surgery  Bilateral. Knee Surgery  Bilateral. Open Inguinal Hernia Surgery  Right. Oral Surgery   Diagnostic Studies History (April Staton, [place]; 05/26/2017 2:14 PM) Colonoscopy  1-5 years ago  Allergies (April Staton, [place]; 05/26/2017 2:15 PM) Penicillins  Sulfa Antibiotics   Medication History (April Staton, CMA; 05/26/2017 2:19 PM) Levothyroxine Sodium (100MCG Tablet, Oral) Active. Pantoprazole Sodium (40MG  Tablet DR, Oral) Active. Aspirin (81MG  Tablet, Oral) Active. SSD (1% Cream, External) Active. Polyethylene Glycol (External) Specific strength unknown - Active. Medications Reconciled  Social History (April Staton, CMA; 05/26/2017 2:14 PM) Alcohol use  Remotely quit alcohol use. Caffeine use  Coffee. Tobacco use   Former smoker.  Family History (April Staton, [place]; 05/26/2017 2:14 PM) Alcohol Abuse  Brother. Arthritis  Father. Breast Cancer  Mother. Cancer  Brother. Diabetes Mellitus  Father, Mother. Heart Disease  Father. Kidney Disease  Brother. Prostate Cancer  Brother. Thyroid problems  Daughter.  Other Problems (April Staton, CMA; 05/26/2017 2:14 PM) Arthritis  Gastroesophageal Reflux Disease  Hepatitis  Melanoma  Prostate Cancer  Thyroid Disease  Umbilical Hernia Repair  Ventral Hernia Repair     Review of Systems (April Staton CMA; 05/26/2017 2:14 PM) General Not Present- Appetite Loss, Chills, Fatigue, Fever, Night Sweats, Weight Gain and Weight Loss. Skin Not Present- Change in Wart/Mole, Dryness, Hives, Jaundice, New Lesions, Non-Healing Wounds, Rash and Ulcer. HEENT Present- Hearing Loss and Wears glasses/contact lenses. Not Present- Earache, Hoarseness, Nose Bleed, Oral Ulcers, Ringing in the Ears, Seasonal Allergies, Sinus Pain, Sore Throat, Visual Disturbances and Yellow Eyes. Respiratory Not Present- Bloody sputum, Chronic Cough, Difficulty Breathing, Snoring and Wheezing. Breast Not Present- Breast Mass, Breast Pain, Nipple Discharge and Skin Changes. Gastrointestinal Not Present- Abdominal Pain, Bloating, Bloody Stool, Change in Bowel Habits, Chronic diarrhea, Constipation, Difficulty Swallowing, Excessive gas, Gets full quickly at meals, Hemorrhoids, Indigestion, Nausea, Rectal Pain and Vomiting. Male Genitourinary Present- Nocturia and Urine Leakage. Not Present- Blood in Urine, Change in Urinary Stream, Frequency, Impotence, Painful Urination and Urgency.  Vitals (April Staton CMA; 05/26/2017 2:19 PM) 05/26/2017 2:19 PM Weight: 178 lb Height: 63in Body Surface Area: 1.84 m Body Mass Index: 31.53 kg/m  Temp.: 97.43F(Oral)  Pulse: 68 (Regular)  BP: 140/68 (Sitting, Left Arm, Standard)       Physical Exam Stark Klein MD;  05/26/2017 2:36 PM) General Mental Status-Alert. General Appearance-Consistent with stated age. Hydration-Well hydrated. Voice-Normal.  Integumentary Note: scar on left neck with some residual tissue. No lymphadenopathy in any neck, posterior auricular, pre auricular, cervical, supraclavicular or infraclavicular areas.   Head and Neck Head-normocephalic, atraumatic with no lesions or palpable masses. Trachea-midline. Thyroid Gland Characteristics - normal size and consistency.  Eye Eyeball - Bilateral-Extraocular movements intact. Sclera/Conjunctiva - Bilateral-No scleral icterus.  Chest and Lung Exam Chest and lung exam reveals -quiet, even and easy respiratory effort with no use of accessory muscles and on auscultation, normal breath sounds, no adventitious sounds and normal vocal resonance. Inspection Chest Wall - Normal. Back - normal.  Cardiovascular Cardiovascular examination reveals -normal heart sounds, regular rate and rhythm with no murmurs and normal pedal pulses bilaterally.  Abdomen Inspection Inspection of the abdomen reveals - No Hernias. Palpation/Percussion Palpation and Percussion of the abdomen reveal - Soft, Non Tender, No Rebound tenderness, No Rigidity (guarding) and No hepatosplenomegaly. Auscultation Auscultation of the abdomen reveals - Bowel sounds normal.  Neurologic Neurologic evaluation reveals -alert and oriented x 3 with no impairment of recent or remote memory. Mental Status-Normal.  Musculoskeletal Global Assessment -Note: no gross deformities.  Normal Exam - Left-Upper Extremity Strength Normal and Lower Extremity Strength Normal. Normal Exam - Right-Upper Extremity Strength Normal and Lower Extremity Strength Normal. Note: kyphotic   Lymphatic Head & Neck  General Head & Neck Lymphatics: Bilateral - Description - Normal. Axillary  General Axillary Region: Bilateral - Description - Normal.  Tenderness - Non Tender. Femoral & Inguinal  Generalized Femoral & Inguinal Lymphatics: Bilateral - Description - No Generalized lymphadenopathy.    Assessment & Plan Stark Klein MD; 05/26/2017 2:38 PM) MELANOMA OF LEFT SIDE OF NECK (C43.4) Impression: traditionally, I would do a lymph node biopsy with a 0.85 mm melanoma with positive margins, however the patient is 79 and is relatively frail. He would not be a candidate for adjuvant therapy should he have a positive lymph node.  I will plan to do a wide local excision only. I discussed the procedure with the patient, his son, and his best friend. I advised that I would at least do sedation with numbing medicine or General anesthesia. I offered him the choice after discussing with anesthesia.  I discussed that I would make an elliptical incision with at least a centimeter margin on all sides of the melanoma biopsy site. I reviewed that I would need to undermine the tissue to pull it together. I discussed that this is an outpatient operation. I reviewed the risks of surgery including bleeding, infection, prolonged numbness, wound dehiscence, and others. I discussed that if he did have a wound dehiscence that he would have to do dressing changes, but that it would eventually heal. He understands and wishes to proceed. Current Plans You are being scheduled for surgery- Our schedulers will call you.  You should hear from our office's scheduling department within 5 working days about the location, date, and time of surgery. We try to make accommodations for patient's preferences in scheduling surgery, but sometimes the OR schedule or the surgeon's schedule prevents [place] from making those accommodations.  If you have not heard from our office [phone]) in 5 working days, call the office and ask for your surgeon's nurse.  If you have other questions about your diagnosis, plan, or surgery, call the office and ask for your surgeon's nurse.  Pt  Education - Melanoma: skin cancer Advised patient to stop ASA, anticoagulant, blood  thinners, and NSAIDs Five (5) days prior to surgery.   Signed by Stark Klein, MD (05/26/2017 2:39 PM)

## 2017-06-11 NOTE — Anesthesia Preprocedure Evaluation (Addendum)
Anesthesia Evaluation  Patient identified by MRN, date of birth, ID band Patient awake    Reviewed: Allergy & Precautions, NPO status , Patient's Chart, lab work & pertinent test results  History of Anesthesia Complications (+) PONV  Airway Mallampati: II  TM Distance: >3 FB Neck ROM: Full    Dental no notable dental hx. (+) Edentulous Upper, Edentulous Lower   Pulmonary former smoker,    Pulmonary exam normal breath sounds clear to auscultation       Cardiovascular negative cardio ROS Normal cardiovascular exam Rhythm:Regular Rate:Normal     Neuro/Psych negative neurological ROS  negative psych ROS   GI/Hepatic Neg liver ROS, GERD  Medicated,  Endo/Other  Hypothyroidism   Renal/GU negative Renal ROS  negative genitourinary   Musculoskeletal  (+) Arthritis , Osteoarthritis,    Abdominal   Peds negative pediatric ROS (+)  Hematology negative hematology ROS (+)   Anesthesia Other Findings   Reproductive/Obstetrics negative OB ROS                            Anesthesia Physical Anesthesia Plan  ASA: II  Anesthesia Plan: General   Post-op Pain Management:    Induction: Intravenous  PONV Risk Score and Plan: 3 and Ondansetron and Treatment may vary due to age or medical condition  Airway Management Planned: Oral ETT  Additional Equipment:   Intra-op Plan:   Post-operative Plan: Extubation in OR  Informed Consent: I have reviewed the patients History and Physical, chart, labs and discussed the procedure including the risks, benefits and alternatives for the proposed anesthesia with the patient or authorized representative who has indicated his/her understanding and acceptance.   Dental advisory given  Plan Discussed with: CRNA  Anesthesia Plan Comments:         Anesthesia Quick Evaluation

## 2017-06-11 NOTE — Discharge Instructions (Addendum)
[facility] Office Phone Number [phone]   POST OP INSTRUCTIONS  Always review your discharge instruction sheet given to you by the facility where your surgery was performed.  IF YOU HAVE DISABILITY OR FAMILY LEAVE FORMS, YOU MUST BRING THEM TO THE OFFICE FOR PROCESSING.  DO NOT GIVE THEM TO YOUR DOCTOR.  1. A prescription for pain medication may be given to you upon discharge.  Take your pain medication as prescribed, if needed.  If narcotic pain medicine is not needed, then you may take acetaminophen (Tylenol) or ibuprofen (Advil) as needed. 2. Take your usually prescribed medications unless otherwise directed 3. If you need a refill on your pain medication, please contact your pharmacy.  They will contact our office to request authorization.  Prescriptions will not be filled after 5pm or on week-ends. 4. You should eat very light the first 24 hours after surgery, such as soup, crackers, pudding, etc.  Resume your normal diet the day after surgery 5. It is common to experience some constipation if taking pain medication after surgery.  Increasing fluid intake and taking a stool softener will usually help or prevent this problem from occurring.  A mild laxative (Milk of Magnesia or Miralax) should be taken according to package directions if there are no bowel movements after 48 hours. 6. You may shower in 48 hours.  The surgical glue will flake off in 2-3 weeks.   7. ACTIVITIES:  No strenuous activity or heavy lifting for 1 week.   a. You may drive when you no longer are taking prescription pain medication, you can comfortably wear a seatbelt, and you can safely maneuver your car and apply brakes. b. RETURN TO WORK:  __________n/a_______________ You should see your doctor in the office for a follow-up appointment approximately three-four weeks after your surgery.    WHEN TO CALL YOUR DOCTOR: 1. Fever over 101.0 2. Nausea and/or vomiting. 3. Extreme swelling or  bruising. 4. Continued bleeding from incision. 5. Increased pain, redness, or drainage from the incision.  The clinic staff is available to answer your questions during regular business hours.  Please don't hesitate to call and ask to speak to one of the nurses for clinical concerns.  If you have a medical emergency, go to the nearest emergency room or call 911.  A surgeon from [facility] is always on call at the hospital.  For further questions, please visit [email or web address]     Post Anesthesia Home Care Instructions  Activity: Get plenty of rest for the remainder of the day. A responsible individual must stay with you for 24 hours following the procedure.  For the next 24 hours, DO NOT: -Drive a car -Operate machinery -Drink alcoholic beverages -Take any medication unless instructed by your physician -Make any legal decisions or sign important papers.  Meals: Start with liquid foods such as gelatin or soup. Progress to regular foods as tolerated. Avoid greasy, spicy, heavy foods. If nausea and/or vomiting occur, drink only clear liquids until the nausea and/or vomiting subsides. Call your physician if vomiting continues.  Special Instructions/Symptoms: Your throat may feel dry or sore from the anesthesia or the breathing tube placed in your throat during surgery. If this causes discomfort, gargle with warm salt water. The discomfort should disappear within 24 hours.  If you had a scopolamine patch placed behind your ear for the management of post- operative nausea and/or vomiting:  1. The medication in the patch is effective for 72 hours, after which it should   removed.  Wrap patch in a tissue and discard in the trash. Wash hands thoroughly with soap and water. 2. You may remove the patch earlier than 72 hours if you experience unpleasant side effects which may include dry mouth, dizziness or visual disturbances. 3. Avoid touching the patch. Wash your hands  with soap and water after contact with the patch.

## 2017-06-11 NOTE — Transfer of Care (Signed)
Immediate Anesthesia Transfer of Care Note  Patient: Evan Miller  Procedure(s) Performed: WIDE LOCAL EXCISION WITH ADVANCEMENT FLAP CLOSURE LEFT NECK MELANOMA (N/A Neck)  Patient Location: PACU  Anesthesia Type:General  Level of Consciousness: awake and patient cooperative  Airway & Oxygen Therapy: Patient Spontanous Breathing and Patient connected to face mask oxygen  Post-op Assessment: Report given to RN and Post -op Vital signs reviewed and stable  Post vital signs: Reviewed and stable  Last Vitals:  Vitals:   06/11/17 0721 06/11/17 0937  BP: (!) 148/63   Pulse: (!) 59 74  Resp: 16 15  Temp: 36.4 C   SpO2: 100% 100%    Last Pain:  Vitals:   06/11/17 0721  TempSrc: Oral         Complications: No apparent anesthesia complications

## 2017-06-12 ENCOUNTER — Encounter (HOSPITAL_BASED_OUTPATIENT_CLINIC_OR_DEPARTMENT_OTHER): Payer: Self-pay | Admitting: General Surgery

## 2017-06-12 NOTE — Anesthesia Postprocedure Evaluation (Signed)
Anesthesia Post Note  Patient: Evan Miller  Procedure(s) Performed: WIDE LOCAL EXCISION WITH ADVANCEMENT FLAP CLOSURE LEFT NECK MELANOMA (N/A Neck)     Patient location during evaluation: PACU Anesthesia Type: General Level of consciousness: awake and alert Pain management: pain level controlled Vital Signs Assessment: post-procedure vital signs reviewed and stable Respiratory status: spontaneous breathing, nonlabored ventilation, respiratory function stable and patient connected to nasal cannula oxygen Cardiovascular status: blood pressure returned to baseline and stable Postop Assessment: no apparent nausea or vomiting Anesthetic complications: no    Last Vitals:  Vitals:   06/11/17 1000 06/11/17 1017  BP: 126/73 120/81  Pulse: 76 86  Resp: 13 16  Temp:  36.7 C  SpO2: 98% 99%    Last Pain:  Vitals:   06/11/17 1017  TempSrc:   PainSc: 0-No pain                 Montez Hageman

## 2017-06-16 NOTE — Progress Notes (Signed)
Please let patient know margins are all negative!

## 2017-08-29 ENCOUNTER — Ambulatory Visit: Payer: Medicare Other | Admitting: Urology

## 2017-08-29 DIAGNOSIS — N3941 Urge incontinence: Secondary | ICD-10-CM

## 2017-08-29 DIAGNOSIS — C61 Malignant neoplasm of prostate: Secondary | ICD-10-CM | POA: Diagnosis not present

## 2017-10-27 ENCOUNTER — Encounter (HOSPITAL_COMMUNITY): Payer: Self-pay

## 2017-10-27 ENCOUNTER — Encounter (HOSPITAL_COMMUNITY)
Admission: EM | Disposition: A | Discharge status: Home / Self Care | Payer: Self-pay | Source: Home / Self Care | Attending: Emergency Medicine

## 2017-10-27 ENCOUNTER — Emergency Department (HOSPITAL_COMMUNITY)
Admission: EM | Admit: 2017-10-27 | Discharge: 2017-10-27 | Disposition: A | Discharge status: Home / Self Care | Payer: Medicare Other | Attending: Emergency Medicine | Admitting: Emergency Medicine

## 2017-10-27 ENCOUNTER — Other Ambulatory Visit: Payer: Self-pay

## 2017-10-27 DIAGNOSIS — K222 Esophageal obstruction: Secondary | ICD-10-CM

## 2017-10-27 DIAGNOSIS — Z8546 Personal history of malignant neoplasm of prostate: Secondary | ICD-10-CM | POA: Diagnosis not present

## 2017-10-27 DIAGNOSIS — Z79891 Long term (current) use of opiate analgesic: Secondary | ICD-10-CM | POA: Diagnosis not present

## 2017-10-27 DIAGNOSIS — Z87891 Personal history of nicotine dependence: Secondary | ICD-10-CM | POA: Diagnosis not present

## 2017-10-27 DIAGNOSIS — Z79890 Hormone replacement therapy (postmenopausal): Secondary | ICD-10-CM | POA: Diagnosis not present

## 2017-10-27 DIAGNOSIS — Z85820 Personal history of malignant melanoma of skin: Secondary | ICD-10-CM | POA: Diagnosis not present

## 2017-10-27 DIAGNOSIS — Z85828 Personal history of other malignant neoplasm of skin: Secondary | ICD-10-CM | POA: Diagnosis not present

## 2017-10-27 DIAGNOSIS — X58XXXA Exposure to other specified factors, initial encounter: Secondary | ICD-10-CM | POA: Diagnosis not present

## 2017-10-27 DIAGNOSIS — K219 Gastro-esophageal reflux disease without esophagitis: Secondary | ICD-10-CM | POA: Insufficient documentation

## 2017-10-27 DIAGNOSIS — E039 Hypothyroidism, unspecified: Secondary | ICD-10-CM | POA: Insufficient documentation

## 2017-10-27 DIAGNOSIS — Z96653 Presence of artificial knee joint, bilateral: Secondary | ICD-10-CM | POA: Diagnosis not present

## 2017-10-27 DIAGNOSIS — T18128A Food in esophagus causing other injury, initial encounter: Secondary | ICD-10-CM | POA: Diagnosis not present

## 2017-10-27 DIAGNOSIS — Z7982 Long term (current) use of aspirin: Secondary | ICD-10-CM | POA: Diagnosis not present

## 2017-10-27 DIAGNOSIS — Z79899 Other long term (current) drug therapy: Secondary | ICD-10-CM | POA: Insufficient documentation

## 2017-10-27 HISTORY — PX: ESOPHAGOGASTRODUODENOSCOPY: SHX5428

## 2017-10-27 LAB — I-STAT CHEM 8, ED
BUN: 20 mg/dL (ref 6–20)
CALCIUM ION: 1.19 mmol/L (ref 1.15–1.40)
Chloride: 104 mmol/L (ref 101–111)
Creatinine, Ser: 1 mg/dL (ref 0.61–1.24)
Glucose, Bld: 107 mg/dL — ABNORMAL HIGH (ref 65–99)
HEMATOCRIT: 38 % — AB (ref 39.0–52.0)
Hemoglobin: 12.9 g/dL — ABNORMAL LOW (ref 13.0–17.0)
Potassium: 4 mmol/L (ref 3.5–5.1)
SODIUM: 142 mmol/L (ref 135–145)
TCO2: 28 mmol/L (ref 22–32)

## 2017-10-27 SURGERY — EGD (ESOPHAGOGASTRODUODENOSCOPY)
Anesthesia: Moderate Sedation

## 2017-10-27 MED ORDER — BUTAMBEN-TETRACAINE-BENZOCAINE 2-2-14 % EX AERO
INHALATION_SPRAY | CUTANEOUS | Status: DC | PRN
  Administered 2017-10-27: 1 via TOPICAL

## 2017-10-27 MED ORDER — ONDANSETRON HCL 4 MG/2ML IJ SOLN
INTRAMUSCULAR | Status: DC | PRN
  Administered 2017-10-27: 4 mg via INTRAVENOUS

## 2017-10-27 MED ORDER — GLUCAGON HCL RDNA (DIAGNOSTIC) 1 MG IJ SOLR
1.0000 mg | Freq: Once | INTRAMUSCULAR | Status: AC
  Administered 2017-10-27: 1 mg via INTRAVENOUS
  Filled 2017-10-27: qty 1

## 2017-10-27 MED ORDER — MIDAZOLAM HCL 5 MG/5ML IJ SOLN
INTRAMUSCULAR | Status: AC
  Filled 2017-10-27: qty 10

## 2017-10-27 MED ORDER — MEPERIDINE HCL 100 MG/ML IJ SOLN
INTRAMUSCULAR | Status: DC | PRN
  Administered 2017-10-27: 25 mg

## 2017-10-27 MED ORDER — BUTAMBEN-TETRACAINE-BENZOCAINE 2-2-14 % EX AERO
INHALATION_SPRAY | CUTANEOUS | Status: AC
  Filled 2017-10-27: qty 5

## 2017-10-27 MED ORDER — SODIUM CHLORIDE 0.9 % IV SOLN: INTRAVENOUS | Status: DC

## 2017-10-27 MED ORDER — MIDAZOLAM HCL 5 MG/5ML IJ SOLN
INTRAMUSCULAR | Status: DC | PRN
  Administered 2017-10-27 (×2): 1 mg via INTRAVENOUS

## 2017-10-27 MED ORDER — ONDANSETRON HCL 4 MG/2ML IJ SOLN
INTRAMUSCULAR | Status: AC
  Filled 2017-10-27: qty 2

## 2017-10-27 MED ORDER — MEPERIDINE HCL 100 MG/ML IJ SOLN
INTRAMUSCULAR | Status: AC
  Filled 2017-10-27: qty 2

## 2017-10-27 NOTE — H&P (Signed)
@LOGO @   Primary Care Physician:  Doree Albee, MD Primary Gastroenterologist:  Dr. Laural Golden  Pre-Procedure History & Physical: HPI:  Evan Miller is a 84 y.o. male here for a probable food impaction. Reports eating lunch today; swallowed a piece of chicken and felt it stick behind his breast bone. He has not able to swallow even his saliva since that time. Saw EDP; glucagon given without improvement. EDP call me for disimpaction. History of esophageal stricture  - status post dilation in the past.  History of GERD for which he takes Protonix 40 mg daily.  Past Medical History:  Diagnosis Date  . Arthritis    back,shoulders and hips  . Cancer ([place])    basal cell/ Melanoma 1997 left  shoulder/  PROSTATE  . GERD (gastroesophageal reflux disease)   . Hepatitis    age 57 years  . Hypothyroidism   . PONV (postoperative nausea and vomiting)   . Prostate CA [facility])     Past Surgical History:  Procedure Laterality Date  . CATARACT EXTRACTION W/PHACO  12/16/2011   Procedure: CATARACT EXTRACTION PHACO AND INTRAOCULAR LENS PLACEMENT (IOC);  Surgeon: Tonny Branch, MD;  Location: [facility];  Service: Ophthalmology;  Laterality: Left;  CDE:17.24   . CATARACT EXTRACTION W/PHACO  12/30/2011   Procedure: CATARACT EXTRACTION PHACO AND INTRAOCULAR LENS PLACEMENT (IOC);  Surgeon: Tonny Branch, MD;  Location: [facility];  Service: Ophthalmology;  Laterality: Right;  CDE 15.40  . COLONOSCOPY    . COLONOSCOPY N/A 08/07/2012   Procedure: COLONOSCOPY;  Surgeon: Rogene Houston, MD;  Location: [facility];  Service: Endoscopy;  Laterality: N/A;  10:00  . COLONOSCOPY N/A 11/22/2015   Procedure: COLONOSCOPY;  Surgeon: Rogene Houston, MD;  [facility];  Service: Endoscopy;  Laterality: N/A;  12:00 - moved to 10:30 - Ann to notify  . ESOPHAGEAL DILATION N/A 02/10/2015   Procedure: ESOPHAGEAL DILATION;  Surgeon: Rogene Houston, MD;  [facility];  Service: Endoscopy;  Laterality: N/A;  .  ESOPHAGEAL DILATION N/A 03/29/2015   Procedure: ESOPHAGEAL DILATION;  Surgeon: Rogene Houston, MD;  [facility];  Service: Endoscopy;  Laterality: N/A;  . ESOPHAGEAL DILATION  11/22/2015   Procedure: ESOPHAGEAL DILATION;  Surgeon: Rogene Houston, MD;  [facility];  Service: Endoscopy;;  . ESOPHAGOGASTRODUODENOSCOPY N/A 02/10/2015   Procedure: ESOPHAGOGASTRODUODENOSCOPY (EGD);  Surgeon: Rogene Houston, MD;  [facility];  Service: Endoscopy;  Laterality: N/A;  925 - moved to 10:20 - Ann to notify pt  . ESOPHAGOGASTRODUODENOSCOPY N/A 03/29/2015   Procedure: ESOPHAGOGASTRODUODENOSCOPY (EGD);  Surgeon: Rogene Houston, MD;  [facility];  Service: Endoscopy;  Laterality: N/A;  145 - moved to 8:30 - Ann notified pt  . ESOPHAGOGASTRODUODENOSCOPY N/A 11/22/2015   Procedure: ESOPHAGOGASTRODUODENOSCOPY (EGD);  Surgeon: Rogene Houston, MD;  [facility];  Service: Endoscopy;  Laterality: N/A;  . HERNIA REPAIR    . HYDROCELE EXCISION     bilateral  . JOINT REPLACEMENT     left knee 9/12  . MELANOMA EXCISION N/A 06/11/2017   Procedure: WIDE LOCAL EXCISION WITH ADVANCEMENT FLAP CLOSURE LEFT NECK MELANOMA;  Surgeon: Stark Klein, MD;  Location: [place];  Service: General;  Laterality: N/A;  GENERAL AND LOCAL  . TOTAL KNEE ARTHROPLASTY  06/24/2011   Procedure: TOTAL KNEE ARTHROPLASTY;  Surgeon: Gearlean Alf;  Location: [facility];  Service: Orthopedics;  Laterality: Right;    Prior  to Admission medications   Medication Sig Start Date End Date Taking? Authorizing Provider  aspirin 81 MG tablet Take 1 tablet (81 mg total) by mouth daily. 11/27/15   Rehman, Mechele Dawley, MD  HYDROcodone-acetaminophen (NORCO) 5-325 MG tablet Take 1-2 tablets by mouth every 6 (six) hours as needed for moderate pain or severe pain. 06/11/17   Stark Klein, MD  levothyroxine (SYNTHROID, LEVOTHROID) 100 MCG tablet Take 100 mcg by mouth daily before breakfast.      [provider]  pantoprazole (PROTONIX) 40 MG tablet take 1 tablet once daily 04/11/16   Butch Penny, NP    Allergies as of 10/27/2017 - Review Complete 10/27/2017  Allergen Reaction Noted  . Ciprofloxacin  06/18/2017  . Penicillins Other (See Comments) 06/06/2011  . Sulfa antibiotics Hives 06/06/2011    Family History  Problem Relation Age of Onset  . Breast cancer Mother   . Prostate cancer Brother   . Nephritis Brother   . Rectal cancer Brother   . Cirrhosis Brother   . Liver cancer Brother   . Healthy Daughter   . Healthy Son     Social History   Socioeconomic History  . Marital status: Married    Spouse name: Not on file  . Number of children: Not on file  . Years of education: Not on file  . Highest education level: Not on file  Occupational History  . Not on file  Social Needs  . Financial resource strain: Not on file  . Food insecurity:    Worry: Not on file    Inability: Not on file  . Transportation needs:    Medical: Not on file    Non-medical: Not on file  Tobacco Use  . Smoking status: Former Smoker    Packs/day: 1.00    Years: 30.00    Pack years: 30.00    Types: Cigarettes    Last attempt to quit: 06/16/1962    Years since quitting: 55.4  . Smokeless tobacco: Former Systems developer    Types: [place] date: 06/16/2006  Substance and Sexual Activity  . Alcohol use: No    Comment: quit  60 yrs ago  . Drug use: No  . Sexual activity: Yes    Birth control/protection: None  Lifestyle  . Physical activity:    Days per week: Not on file    Minutes per session: Not on file  . Stress: Not on file  Relationships  . Social connections:    Talks on phone: Not on file    Gets together: Not on file    Attends religious service: Not on file    Active member of club or organization: Not on file    Attends meetings of clubs or organizations: Not on file    Relationship status: Not on file  . Intimate partner violence:    Fear of current or ex  partner: Not on file    Emotionally abused: Not on file    Physically abused: Not on file    Forced sexual activity: Not on file  Other Topics Concern  . Not on file  Social History Narrative  . Not on file    Review of Systems: See HPI, otherwise negative ROS  Physical Exam: BP (!) 145/95 (BP Location: Left Arm)   Pulse 72   Temp 97.8 F (36.6 C) (Oral)   Resp 16   Ht 5\' 2"  (1.575 m)   Wt 177 lb (80.3 kg)   SpO2  99%   BMI 32.37 kg/m  General:   Alert,  pleasant and cooperative in NAD Neck:  Supple; no masses or thyromegaly. No significant cervical adenopathy. Lungs:  Clear throughout to auscultation.   No wheezes, crackles, or rhonchi. No acute distress. Heart:  Regular rate and rhythm; no murmurs, clicks, rubs,  or gallops. Abdomen: Non-distended, normal bowel sounds.  Soft and nontender without appreciable mass or hepatosplenomegaly.  Pulses:  Normal pulses noted. Extremities:  Without clubbing or edema.  Impression:  84 year old gentleman presents with history consistent with esophageal food impaction. History of esophageal stricture and GERD.    Recommendations:  The patient emergency EGD with disimpaction. I explained to the patient and his upper GI tract was not empty, esophageal dilation would not be attempted this evening. He would be offered return appointment to see Dr. Laural Golden for an elective dilation as feasible/appropriate at later date. The risks, benefits, limitations, alternatives and imponderables have been reviewed with the patient. Potential for esophageal dilation, biopsy, etc. have also been reviewed.  Questions have been answered. All parties agreeable.             Notice: This dictation was prepared with Dragon dictation along with smaller phrase technology. Any transcriptional errors that result from this process are unintentional and may not be corrected upon review.

## 2017-10-27 NOTE — ED Notes (Signed)
Pt continues to not be able to handle his saliva  Endo team called in

## 2017-10-27 NOTE — Discharge Instructions (Signed)
EGD Discharge instructions Please read the instructions outlined below and refer to this sheet in the next few weeks. These discharge instructions provide you with general information on caring for yourself after you leave the hospital. Your doctor may also give you specific instructions. While your treatment has been planned according to the most current medical practices available, unavoidable complications occasionally occur. If you have any problems or questions after discharge, please call your doctor. ACTIVITY  You may resume your regular activity but move at a slower pace for the next 24 hours.   Take frequent rest periods for the next 24 hours.   Walking will help expel (get rid of) the air and reduce the bloated feeling in your abdomen.   No driving for 24 hours (because of the anesthesia (medicine) used during the test).   You may shower.   Do not sign any important legal documents or operate any machinery for 24 hours (because of the anesthesia used during the test).  NUTRITION  Drink plenty of fluids.   You may resume your normal diet.   Begin with a light meal and progress to your normal diet.   Avoid alcoholic beverages for 24 hours or as instructed by your caregiver.  MEDICATIONS  You may resume your normal medications unless your caregiver tells you otherwise.  WHAT YOU CAN EXPECT TODAY  You may experience abdominal discomfort such as a feeling of fullness or gas pains.  FOLLOW-UP  Your doctor will discuss the results of your test with you.  SEEK IMMEDIATE MEDICAL ATTENTION IF ANY OF THE FOLLOWING OCCUR:  Excessive nausea (feeling sick to your stomach) and/or vomiting.   Severe abdominal pain and distention (swelling).   Trouble swallowing.   Temperature over 101 F (37.8 C).   Rectal bleeding or vomiting of blood.    GERD information provided  Continue Protonix 40 mg daily  Full liquid/soft diet until your esophagus can be dilated  Contact Dr.  Laural Golden tomorrow to arrange dilation of esophagus in the next 1-2 weeks.  Soft-Food Meal Plan A soft-food meal plan includes foods that are safe and easy to swallow. This meal plan typically is used:  If you are having trouble chewing or swallowing foods.  As a transition meal plan after only having had liquid meals for a long period.  What do I need to know about the soft-food meal plan? A soft-food meal plan includes tender foods that are soft and easy to chew and swallow. In most cases, bite-sized pieces of food are easier to swallow. A bite-sized piece is about  inch or smaller. Foods in this plan do not need to be ground or pureed. Foods that are very hard, crunchy, or sticky should be avoided. Also, breads, cereals, yogurts, and desserts with nuts, seeds, or fruits should be avoided. What foods can I eat? Grains Rice and wild rice. Moist bread, dressing, pasta, and noodles. Well-moistened dry or cooked cereals, such as farina (cooked wheat cereal), oatmeal, or grits. Biscuits, breads, muffins, pancakes, and waffles that have been well moistened. Vegetables Shredded lettuce. Cooked, tender vegetables, including potatoes without skins. Vegetable juices. Broths or creamed soups made with vegetables that are not stringy or chewy. Strained tomatoes (without seeds). Fruits Canned or well-cooked fruits. Soft (ripe), peeled fresh fruits, such as peaches, nectarines, kiwi, cantaloupe, honeydew melon, and watermelon (without seeds). Soft berries with small seeds, such as strawberries. Fruit juices (without pulp). Meats and Other Protein Sources Moist, tender, lean beef. Mutton. Lamb. Veal. Chicken. [place].  Liver. Ham. Fish without bones. Eggs. Dairy Milk, milk drinks, and cream. Plain cream cheese and cottage cheese. Plain yogurt. Sweets/Desserts Flavored gelatin desserts. Custard. Plain ice cream, frozen yogurt, sherbet, milk shakes, and malts. Plain cakes and cookies. Plain hard  candy. Other Butter, margarine (without trans fat), and cooking oils. Mayonnaise. Cream sauces. Mild spices, salt, and sugar. Syrup, molasses, honey, and jelly. The items listed above may not be a complete list of recommended foods or beverages. Contact your dietitian for more options. What foods are not recommended? Grains Dry bread, toast, crackers that have not been moistened. Coarse or dry cereals, such as bran, granola, and shredded wheat. Tough or chewy crusty breads, such as [place] bread or baguettes. Vegetables Corn. Raw vegetables except shredded lettuce. Cooked vegetables that are tough or stringy. Tough, crisp, fried potatoes and potato skins. Fruits Fresh fruits with skins or seeds or both, such as apples, pears, or grapes. Stringy, high-pulp fruits, such as papaya, pineapple, coconut, or mango. Fruit leather, fruit roll-ups, and all dried fruits. Meats and Other Protein Sources Sausages and hot dogs. Meats with gristle. Fish with bones. Nuts, seeds, and chunky peanut or other nut butters. Sweets/Desserts Cakes or cookies that are very dry or chewy. The items listed above may not be a complete list of foods and beverages to avoid. Contact your dietitian for more information. This information is not intended to replace advice given to you by your health care provider. Make sure you discuss any questions you have with your health care provider. Document Released: 09/03/2007 Document Revised: 11/02/2015 Document Reviewed: 04/23/2013 Elsevier Interactive Patient Education  2017 Elsevier Inc[employer] Full Liquid Diet A full liquid diet may be used:  To help you transition from a clear liquid diet to a soft diet.  When your body is healing and can only tolerate foods that are easy to digest.  Before or after certain a procedure, test, or surgery (such as stomach or intestinal surgeries).  If you have trouble swallowing or chewing.  A full liquid diet includes fluids and foods that are  liquid or will become liquid at room temperature. The full liquid diet gives you the proteins, fluids, salts, and minerals that you need for energy. If you continue this diet for more than 72 hours, talk to your health care provider about how many calories you need to consume. If you continue the diet for more than 5 days, talk to your health care provider about taking a multivitamin or a nutritional supplement. What do I need to know about a full liquid diet?  You may have any liquid.  You may have any food that becomes a liquid at room temperature. The food is considered a liquid if it can be poured off a spoon at room temperature.  Drink one serving of citrus or vitamin C-enriched fruit juice daily. What foods can I eat? Grains Any grain food that can be pureed in soup (such as crackers, pasta, and rice). Hot cereal (such as farina or oatmeal) that has been blended. Talk to your health care provider or dietitian about these foods. Vegetables Pulp-free tomato or vegetable juice. Vegetables pureed in soup. Fruits Fruit juice, including nectars and juices with pulp. Meats and Other Protein Sources Eggs in custard, eggnog mix, and eggs used in ice cream or pudding. Strained meats, like in baby food, may be allowed. Consult your health care provider. Dairy Milk and milk-based beverages, including milk shakes and instant breakfast mixes. Smooth yogurt. Pureed cottage cheese. Avoid these  foods if they are not well tolerated. Beverages All beverages, including liquid nutritional supplements. Ask your health care provider if you can have carbonated beverages. They may not be well tolerated. Condiments Iodized salt, pepper, spices, and flavorings. Cocoa powder. Vinegar, ketchup, yellow mustard, smooth sauces (such as hollandaise, cheese sauce, or white sauce), and soy sauce. Sweets and Desserts Custard, smooth pudding. Flavored gelatin. Tapioca, junket. Plain ice cream, sherbet, fruit ices. Frozen  ice pops, frozen fudge pops, pudding pops, and other frozen bars with cream. Syrups, including chocolate syrup. Sugar, honey, jelly. Fats and Oils Margarine, butter, cream, sour cream, and oils. Other Broth and cream soups. Strained, broth-based soups. The items listed above may not be a complete list of recommended foods or beverages. Contact your dietitian for more options. What foods can I not eat? Grains All breads. Grains are not allowed unless they are pureed into soup. Vegetables Vegetables are not allowed unless they are juiced, or cooked and pureed into soup. Fruits Fruits are not allowed unless they are juiced. Meats and Other Protein Sources Any meat or fish. Cooked or raw eggs. Nut butters. Dairy Cheese. Condiments Stone ground mustards. Fats and Oils Fats that are coarse or chunky. Sweets and Desserts Ice cream or other frozen desserts that have any solids in them or on top, such as nuts, chocolate chips, and pieces of cookies. Cakes. Cookies. Candy. Others Soups with chunks or pieces in them. The items listed above may not be a complete list of foods and beverages to avoid. Contact your dietitian for more information. This information is not intended to replace advice given to you by your health care provider. Make sure you discuss any questions you have with your health care provider. Document Released: 05/27/2005 Document Revised: 11/02/2015 Document Reviewed: 04/01/2013 Elsevier Interactive Patient Education  2017 [place].  Gastroesophageal Reflux Disease, Adult Normally, food travels down the esophagus and stays in the stomach to be digested. However, when a person has gastroesophageal reflux disease (GERD), food and stomach acid move back up into the esophagus. When this happens, the esophagus becomes sore and inflamed. Over time, GERD can create small holes (ulcers) in the lining of the esophagus. What are the causes? This condition is caused by a problem  with the muscle between the esophagus and the stomach (lower esophageal sphincter, or LES). Normally, the LES muscle closes after food passes through the esophagus to the stomach. When the LES is weakened or abnormal, it does not close properly, and that allows food and stomach acid to go back up into the esophagus. The LES can be weakened by certain dietary substances, medicines, and medical conditions, including:  Tobacco use.  Pregnancy.  Having a hiatal hernia.  Heavy alcohol use.  Certain foods and beverages, such as coffee, chocolate, onions, and peppermint.  What increases the risk? This condition is more likely to develop in:  People who have an increased body weight.  People who have connective tissue disorders.  People who use NSAID medicines.  What are the signs or symptoms? Symptoms of this condition include:  Heartburn.  Difficult or painful swallowing.  The feeling of having a lump in the throat.  Abitter taste in the mouth.  Bad breath.  Having a large amount of saliva.  Having an upset or bloated stomach.  Belching.  Chest pain.  Shortness of breath or wheezing.  Ongoing (chronic) cough or a night-time cough.  Wearing away of tooth enamel.  Weight loss.  Different conditions can cause chest  pain. Make sure to see your health care provider if you experience chest pain. How is this diagnosed? Your health care provider will take a medical history and perform a physical exam. To determine if you have mild or severe GERD, your health care provider may also monitor how you respond to treatment. You may also have other tests, including:  An endoscopy toexamine your stomach and esophagus with a small camera.  A test thatmeasures the acidity level in your esophagus.  A test thatmeasures how much pressure is on your esophagus.  A barium swallow or modified barium swallow to show the shape, size, and functioning of your esophagus.  How is this  treated? The goal of treatment is to help relieve your symptoms and to prevent complications. Treatment for this condition may vary depending on how severe your symptoms are. Your health care provider may recommend:  Changes to your diet.  Medicine.  Surgery.  Follow these instructions at home: Diet  Follow a diet as recommended by your health care provider. This may involve avoiding foods and drinks such as: ? Coffee and tea (with or without caffeine). ? Drinks that containalcohol. ? Energy drinks and sports drinks. ? Carbonated drinks or sodas. ? Chocolate and cocoa. ? Peppermint and mint flavorings. ? Garlic and onions. ? Horseradish. ? Spicy and acidic foods, including peppers, chili powder, curry powder, vinegar, hot sauces, and barbecue sauce. ? Citrus fruit juices and citrus fruits, such as oranges, lemons, and limes. ? Tomato-based foods, such as red sauce, chili, salsa, and pizza with red sauce. ? Fried and fatty foods, such as donuts, french fries, potato chips, and high-fat dressings. ? High-fat meats, such as hot dogs and fatty cuts of red and white meats, such as rib eye steak, sausage, ham, and bacon. ? High-fat dairy items, such as whole milk, butter, and cream cheese.  Eat small, frequent meals instead of large meals.  Avoid drinking large amounts of liquid with your meals.  Avoid eating meals during the 2-3 hours before bedtime.  Avoid lying down right after you eat.  Do not exercise right after you eat. General instructions  Pay attention to any changes in your symptoms.  Take over-the-counter and prescription medicines only as told by your health care provider. Do not take aspirin, ibuprofen, or other NSAIDs unless your health care provider told you to do so.  Do not use any tobacco products, including cigarettes, chewing tobacco, and e-cigarettes. If you need help quitting, ask your health care provider.  Wear loose-fitting clothing. Do not wear  anything tight around your waist that causes pressure on your abdomen.  Raise (elevate) the head of your bed 6 inches (15cm).  Try to reduce your stress, such as with yoga or meditation. If you need help reducing stress, ask your health care provider.  If you are overweight, reduce your weight to an amount that is healthy for you. Ask your health care provider for guidance about a safe weight loss goal.  Keep all follow-up visits as told by your health care provider. This is important. Contact a health care provider if:  You have new symptoms.  You have unexplained weight loss.  You have difficulty swallowing, or it hurts to swallow.  You have wheezing or a persistent cough.  Your symptoms do not improve with treatment.  You have a hoarse voice. Get help right away if:  You have pain in your arms, neck, jaw, teeth, or back.  You feel sweaty, dizzy, or light-headed.  You have chest pain or shortness of breath.  You vomit and your vomit looks like blood or coffee grounds.  You faint.  Your stool is bloody or black.  You cannot swallow, drink, or eat. This information is not intended to replace advice given to you by your health care provider. Make sure you discuss any questions you have with your health care provider. Document Released: 03/06/2005 Document Revised: 10/25/2015 Document Reviewed: 09/21/2014 Elsevier Interactive Patient Education  [employer].

## 2017-10-27 NOTE — Op Note (Signed)
[facility] Patient Name: Evan Miller Procedure Date: 10/27/2017 8:48 PM MRN: [id number] Date of Birth: [birth year] Attending MD: Norvel Richards , MD CSN: [id number] Age: 84 Admit Type: Outpatient Procedure:                Upper GI endoscopy Indications:              Foreign body in the esophagus Providers:                Norvel Richards, MD, Rosina Lowenstein, RN, Nelma Rothman, Technician, Aram Candela Referring MD:             Hildred Laser, MD Medicines:                Midazolam 2 mg IV, Meperidine 25 mg IV Complications:            No immediate complications. Estimated Blood Loss:     Estimated blood loss was minimal. Procedure:                Pre-Anesthesia Assessment:                           - Prior to the procedure, a History and Physical                            was performed, and patient medications and                            allergies were reviewed. The patient's tolerance of                            previous anesthesia was also reviewed. The risks                            and benefits of the procedure and the sedation                            options and risks were discussed with the patient.                            All questions were answered, and informed consent                            was obtained. Prior Anticoagulants: The patient has                            taken no previous anticoagulant or antiplatelet                            agents. ASA Grade Assessment: III - A patient with                            severe systemic disease. After reviewing the risks  and benefits, the patient was deemed in                            satisfactory condition to undergo the procedure.                           After obtaining informed consent, the endoscope was                            passed under direct vision. Throughout the                            procedure, the patient's blood pressure,  pulse, and                            oxygen saturations were monitored continuously. The                            EG-[id number] ([id number]) scope was introduced through the                            mouth, and advanced to the body of the stomach. The                            upper GI endoscopy was accomplished without                            difficulty. The patient tolerated the procedure                            well. Scope In: 9:05:28 PM Scope Out: 9:09:47 PM Total Procedure Duration: 0 hours 4 minutes 19 seconds  Findings:      Food was found in the middle third of the esophagus. large piece of meat       with smaller pieces and fluid, ball valving in the midesophagus.       prominent Schatzki's ring GE junction. No tumor seen. Largest piece in       esophagus was gently pushed across to shatzkis ring ring - the smaller       pieces followed.      A moderate Schatzki ring was found at the gastroesophageal junction.       limited EGD as described. Complete exam not attempted. No esophageal       dilation attempted per plan.      A medium amount of food (residue) was found in the gastric body. Impression:               - Food in the middle third of the esophagus.                            Disimpaction as described above.                           - Moderate Schatzki ring. Incomplete exam per plan.                           -  A medium amount of food (residue) in the stomach.                           - No specimens collected. Moderate Sedation:      Moderate (conscious) sedation was administered by the endoscopy nurse       and supervised by the endoscopist. The following parameters were       monitored: oxygen saturation, heart rate, blood pressure, respiratory       rate, EKG, adequacy of pulmonary ventilation, and response to care.       Total physician intraservice time was 9 minutes. Recommendation:           - Patient has a contact number available for                             emergencies. The signs and symptoms of potential                            delayed complications were discussed with the                            patient. Return to normal activities tomorrow.                            Written discharge instructions were provided to the                            patient. full liquid/soft diet until esophageal                            dilation can be accomplished. Continue Protonix 40                            mg daily. Follow-up in [place]. [place] in the next 1-2                            weeks. Procedure Code(s):        --- Professional ---                           [phone], 52, Esophagogastroduodenoscopy, flexible,                            transoral; diagnostic, including collection of                            specimen(s) by brushing or washing, when performed                            (separate procedure) Diagnosis Code(s):        --- Professional ---                           S01.093A, Food in esophagus causing other injury,  initial encounter                           K22.2, Esophageal obstruction                           T18.108A, Unspecified foreign body in esophagus                            causing other injury, initial encounter CPT copyright 2017 American Medical Association. All rights reserved. The codes documented in this report are preliminary and upon coder review may  be revised to meet current compliance requirements. Cristopher Estimable. Rourk, MD Norvel Richards, MD 10/27/2017 9:18:44 PM This report has been signed electronically. Number of Addenda: 0

## 2017-10-27 NOTE — ED Notes (Signed)
Pt transported to endo per their request

## 2017-10-27 NOTE — ED Notes (Signed)
Pt eating chicken and felt it lodge Now cannot handle his secretions

## 2017-10-27 NOTE — ED Provider Notes (Signed)
[facility] EMERGENCY DEPARTMENT Provider Note   CSN: [id number] Arrival date & time: 10/27/17  1503     History   Chief Complaint Chief Complaint  Patient presents with  . Foreign Body    Possible Food Bolus    HPI CLARION MOONEYHAN is a 84 y.o. male.  Patient states that he was eating chicken around lunchtime and it it has gotten stuck in his esophagus.  He cannot swallow his own saliva  The history is provided by the patient. No language interpreter was used.  Foreign Body  The current episode started 6 to 12 hours ago. The foreign body is suspected to be swallowed. Suspected object: Chicken. The incident was witnessed. Pertinent negatives include no fever, no congestion, no cough, no chest pain and no abdominal pain.    Past Medical History:  Diagnosis Date  . Arthritis    back,shoulders and hips  . Cancer ([place])    basal cell/ Melanoma 1997 left  shoulder/  PROSTATE  . GERD (gastroesophageal reflux disease)   . Hepatitis    age 63 years  . Hypothyroidism   . PONV (postoperative nausea and vomiting)   . Prostate CA [facility])     Patient Active Problem List   Diagnosis Date Noted  . Dysphagia 09/27/2015  . History of colonic polyps 07/28/2012  . Left sided abdominal pain 07/28/2012  . Hypothyroidism 07/28/2012  . OA (osteoarthritis) of knee 06/24/2011    Past Surgical History:  Procedure Laterality Date  . CATARACT EXTRACTION W/PHACO  12/16/2011   Procedure: CATARACT EXTRACTION PHACO AND INTRAOCULAR LENS PLACEMENT (IOC);  Surgeon: Tonny Branch, MD;  Location: [facility];  Service: Ophthalmology;  Laterality: Left;  CDE:17.24   . CATARACT EXTRACTION W/PHACO  12/30/2011   Procedure: CATARACT EXTRACTION PHACO AND INTRAOCULAR LENS PLACEMENT (IOC);  Surgeon: Tonny Branch, MD;  Location: [facility];  Service: Ophthalmology;  Laterality: Right;  CDE 15.40  . COLONOSCOPY    . COLONOSCOPY N/A 08/07/2012   Procedure: COLONOSCOPY;  Surgeon: Rogene Houston, MD;  [facility];   Service: Endoscopy;  Laterality: N/A;  10:00  . COLONOSCOPY N/A 11/22/2015   Procedure: COLONOSCOPY;  Surgeon: Rogene Houston, MD;  [facility];  Service: Endoscopy;  Laterality: N/A;  12:00 - moved to 10:30 - Ann to notify  . ESOPHAGEAL DILATION N/A 02/10/2015   Procedure: ESOPHAGEAL DILATION;  Surgeon: Rogene Houston, MD;  [facility];  Service: Endoscopy;  Laterality: N/A;  . ESOPHAGEAL DILATION N/A 03/29/2015   Procedure: ESOPHAGEAL DILATION;  Surgeon: Rogene Houston, MD;  [facility];  Service: Endoscopy;  Laterality: N/A;  . ESOPHAGEAL DILATION  11/22/2015   Procedure: ESOPHAGEAL DILATION;  Surgeon: Rogene Houston, MD;  [facility];  Service: Endoscopy;;  . ESOPHAGOGASTRODUODENOSCOPY N/A 02/10/2015   Procedure: ESOPHAGOGASTRODUODENOSCOPY (EGD);  Surgeon: Rogene Houston, MD;  [facility];  Service: Endoscopy;  Laterality: N/A;  925 - moved to 10:20 - Ann to notify pt  . ESOPHAGOGASTRODUODENOSCOPY N/A 03/29/2015   Procedure: ESOPHAGOGASTRODUODENOSCOPY (EGD);  Surgeon: Rogene Houston, MD;  [facility];  Service: Endoscopy;  Laterality: N/A;  145 - moved to 8:30 - Ann notified pt  . ESOPHAGOGASTRODUODENOSCOPY N/A 11/22/2015   Procedure: ESOPHAGOGASTRODUODENOSCOPY (EGD);  Surgeon: Rogene Houston, MD;  [facility];  Service: Endoscopy;  Laterality: N/A;  . HERNIA REPAIR    . HYDROCELE EXCISION     bilateral  . JOINT REPLACEMENT  left knee 9/12  . MELANOMA EXCISION N/A 06/11/2017   Procedure: WIDE LOCAL EXCISION WITH ADVANCEMENT FLAP CLOSURE LEFT NECK MELANOMA;  Surgeon: Stark Klein, MD;  Location: [place];  Service: General;  Laterality: N/A;  GENERAL AND LOCAL  . TOTAL KNEE ARTHROPLASTY  06/24/2011   Procedure: TOTAL KNEE ARTHROPLASTY;  Surgeon: Gearlean Alf;  Location: [facility];  Service: Orthopedics;  Laterality: Right;        Home Medications    Prior to Admission medications     Medication Sig Start Date End Date Taking? Authorizing Provider  aspirin 81 MG tablet Take 1 tablet (81 mg total) by mouth daily. 11/27/15   Rehman, Mechele Dawley, MD  HYDROcodone-acetaminophen (NORCO) 5-325 MG tablet Take 1-2 tablets by mouth every 6 (six) hours as needed for moderate pain or severe pain. 06/11/17   Stark Klein, MD  levothyroxine (SYNTHROID, LEVOTHROID) 100 MCG tablet Take 100 mcg by mouth daily before breakfast.     [provider]  pantoprazole (PROTONIX) 40 MG tablet take 1 tablet once daily 04/11/16   Setzer, Rona Ravens, NP    Family History Family History  Problem Relation Age of Onset  . Breast cancer Mother   . Prostate cancer Brother   . Nephritis Brother   . Rectal cancer Brother   . Cirrhosis Brother   . Liver cancer Brother   . Healthy Daughter   . Healthy Son     Social History Social History   Tobacco Use  . Smoking status: Former Smoker    Packs/day: 1.00    Years: 30.00    Pack years: 30.00    Types: Cigarettes    Last attempt to quit: 06/16/1962    Years since quitting: 55.4  . Smokeless tobacco: Former Systems developer    Types: [place] date: 06/16/2006  Substance Use Topics  . Alcohol use: No    Comment: quit  60 yrs ago  . Drug use: No     Allergies   Ciprofloxacin; Penicillins; and Sulfa antibiotics   Review of Systems Review of Systems  Constitutional: Negative for appetite change, fatigue and fever.  HENT: Negative for congestion, ear discharge and sinus pressure.        Feels like something in throat  Eyes: Negative for discharge.  Respiratory: Negative for cough.   Cardiovascular: Negative for chest pain.  Gastrointestinal: Negative for abdominal pain and diarrhea.  Genitourinary: Negative for frequency and hematuria.  Musculoskeletal: Negative for back pain.  Skin: Negative for rash.  Neurological: Negative for seizures and headaches.  Psychiatric/Behavioral: Negative for hallucinations.     Physical Exam Updated Vital  Signs BP (!) 145/95 (BP Location: Left Arm)   Pulse 72   Temp 97.8 F (36.6 C) (Oral)   Resp 16   Ht 5\' 2"  (1.575 m)   Wt 80.3 kg (177 lb)   SpO2 99%   BMI 32.37 kg/m   Physical Exam  Constitutional: He is oriented to person, place, and time. He appears well-developed.  HENT:  Head: Normocephalic.  Eyes: Conjunctivae and EOM are normal. No scleral icterus.  Neck: Neck supple. No thyromegaly present.  Cardiovascular: Normal rate and regular rhythm. Exam reveals no gallop and no friction rub.  No murmur heard. Pulmonary/Chest: No stridor. He has no wheezes. He has no rales. He exhibits no tenderness.  Abdominal: He exhibits no distension. There is no tenderness. There is no rebound.  Musculoskeletal: Normal range of motion. He exhibits no edema.  Lymphadenopathy:    He has no cervical adenopathy.  Neurological: He is oriented to person, place, and time. He exhibits normal muscle tone. Coordination normal.  Skin: No rash noted. No erythema.  Psychiatric: He has a normal mood and affect. His behavior is normal.     ED Treatments / Results  Labs (all labs ordered are listed, but only abnormal results are displayed) Labs Reviewed  I-STAT CHEM 8, ED - Abnormal; Notable for the following components:      Result Value   Glucose, Bld 107 (*)    Hemoglobin 12.9 (*)    HCT 38.0 (*)    All other components within normal limits    EKG None  Radiology No results found.  Procedures Procedures (including critical care time)  Medications Ordered in ED Medications  glucagon (human recombinant) (GLUCAGEN) injection 1 mg (1 mg Intravenous Given 10/27/17 1934)     Initial Impression / Assessment and Plan / ED Course  I have reviewed the triage vital signs and the nursing notes.  Pertinent labs & imaging results that were available during my care of the patient were reviewed by me and considered in my medical decision making (see chart for details).     Patient with meat  impaction in esophagus.  He did not get any help from glucagon IV.  I spoke with the GI doctor and he will take the patient to Endo tonight to remove the meat impaction  Final Clinical Impressions(s) / ED Diagnoses   Final diagnoses:  Food impaction of esophagus, initial encounter    ED Discharge Orders    None       Milton Ferguson, MD 10/27/17 2019

## 2017-10-27 NOTE — ED Triage Notes (Signed)
PAtient reports of 3 episodes since Thursday and eating and not able to swallow food well. Patient states he was eating chicken at 1230 today and has food stuck in throat. Patient states he is unable to keep drink down.

## 2017-10-27 NOTE — ED Notes (Signed)
Pt is visiting with pastor

## 2017-11-06 ENCOUNTER — Encounter (INDEPENDENT_AMBULATORY_CARE_PROVIDER_SITE_OTHER): Payer: Self-pay | Admitting: Internal Medicine

## 2017-11-06 ENCOUNTER — Encounter (INDEPENDENT_AMBULATORY_CARE_PROVIDER_SITE_OTHER): Payer: Self-pay | Admitting: *Deleted

## 2017-11-06 ENCOUNTER — Ambulatory Visit (INDEPENDENT_AMBULATORY_CARE_PROVIDER_SITE_OTHER): Payer: Medicare Other | Admitting: Internal Medicine

## 2017-11-06 VITALS — BP 118/64 | HR 68 | Temp 97.7°F | Ht 62.0 in | Wt 176.4 lb

## 2017-11-06 DIAGNOSIS — R1310 Dysphagia, unspecified: Secondary | ICD-10-CM | POA: Diagnosis not present

## 2017-11-06 DIAGNOSIS — R1319 Other dysphagia: Secondary | ICD-10-CM | POA: Insufficient documentation

## 2017-11-06 DIAGNOSIS — K222 Esophageal obstruction: Secondary | ICD-10-CM | POA: Diagnosis not present

## 2017-11-06 DIAGNOSIS — T18128A Food in esophagus causing other injury, initial encounter: Secondary | ICD-10-CM

## 2017-11-06 NOTE — Patient Instructions (Signed)
The risks of bleeding, perforation and infection were reviewed with patient.  

## 2017-11-06 NOTE — Progress Notes (Signed)
Subjective:    Patient ID: Evan Miller, male    DOB: 1925/12/12, 84 y.o.   MRN: [id number]  HPI Here today for f/u after recent visit to the ED (10/27/2017)  for probable food impaction.Dr. Gala Romney performed and EGD which revealed food in the middle 3rd of the esophagus. Moderate Schatzki ring. Incomplete exam. Medium amt of food reside in stomach. States had been having trouble swallowing for about 3 months.  Appetite is okay. He is on a soft diet (Baby food) since EGD. States prepared food now is ground up in a blender. Has undergone 4 EGD/ED in the past.      Review of Systems Past Medical History:  Diagnosis Date  . Arthritis    back,shoulders and hips  . Cancer ([place])    basal cell/ Melanoma 1997 left  shoulder/  PROSTATE  . GERD (gastroesophageal reflux disease)   . Hepatitis    age 57 years  . Hypothyroidism   . PONV (postoperative nausea and vomiting)   . Prostate CA [facility])     Past Surgical History:  Procedure Laterality Date  . CATARACT EXTRACTION W/PHACO  12/16/2011   Procedure: CATARACT EXTRACTION PHACO AND INTRAOCULAR LENS PLACEMENT (IOC);  Surgeon: Tonny Branch, MD;  [facility];  Service: Ophthalmology;  Laterality: Left;  CDE:17.24   . CATARACT EXTRACTION W/PHACO  12/30/2011   Procedure: CATARACT EXTRACTION PHACO AND INTRAOCULAR LENS PLACEMENT (IOC);  Surgeon: Tonny Branch, MD;  [facility];  Service: Ophthalmology;  Laterality: Right;  CDE 15.40  . COLONOSCOPY    . COLONOSCOPY N/A 08/07/2012   Procedure: COLONOSCOPY;  Surgeon: Rogene Houston, MD;  [facility];  Service: Endoscopy;  Laterality: N/A;  10:00  . COLONOSCOPY N/A 11/22/2015   Procedure: COLONOSCOPY;  Surgeon: Rogene Houston, MD;  [facility];  Service: Endoscopy;  Laterality: N/A;  12:00 - moved to 10:30 - Ann to notify  . ESOPHAGEAL DILATION N/A 02/10/2015   Procedure: ESOPHAGEAL DILATION;  Surgeon: Rogene Houston, MD;  [facility];  Service: Endoscopy;   Laterality: N/A;  . ESOPHAGEAL DILATION N/A 03/29/2015   Procedure: ESOPHAGEAL DILATION;  Surgeon: Rogene Houston, MD;  [facility];  Service: Endoscopy;  Laterality: N/A;  . ESOPHAGEAL DILATION  11/22/2015   Procedure: ESOPHAGEAL DILATION;  Surgeon: Rogene Houston, MD;  [facility];  Service: Endoscopy;;  . ESOPHAGOGASTRODUODENOSCOPY N/A 02/10/2015   Procedure: ESOPHAGOGASTRODUODENOSCOPY (EGD);  Surgeon: Rogene Houston, MD;  [facility];  Service: Endoscopy;  Laterality: N/A;  925 - moved to 10:20 - Ann to notify pt  . ESOPHAGOGASTRODUODENOSCOPY N/A 03/29/2015   Procedure: ESOPHAGOGASTRODUODENOSCOPY (EGD);  Surgeon: Rogene Houston, MD;  [facility];  Service: Endoscopy;  Laterality: N/A;  145 - moved to 8:30 - Ann notified pt  . ESOPHAGOGASTRODUODENOSCOPY N/A 11/22/2015   Procedure: ESOPHAGOGASTRODUODENOSCOPY (EGD);  Surgeon: Rogene Houston, MD;  [facility];  Service: Endoscopy;  Laterality: N/A;  . HERNIA REPAIR    . HYDROCELE EXCISION     bilateral  . JOINT REPLACEMENT     left knee 9/12  . MELANOMA EXCISION N/A 06/11/2017   Procedure: WIDE LOCAL EXCISION WITH ADVANCEMENT FLAP CLOSURE LEFT NECK MELANOMA;  Surgeon: Stark Klein, MD;  Location: [place];  Service: General;  Laterality: N/A;  GENERAL AND LOCAL  . TOTAL KNEE ARTHROPLASTY  06/24/2011   Procedure: TOTAL KNEE ARTHROPLASTY;  Surgeon: Gearlean Alf;  [facility];  Service: Orthopedics;  Laterality: Right;    Allergies  Allergen Reactions  . Ciprofloxacin     Hives at IV infusion site  . Penicillins Other (See Comments)    Reaction: large knots on body Has patient had a PCN reaction causing immediate rash, facial/tongue/throat swelling, SOB or lightheadedness with hypotension: No Has patient had a PCN reaction causing severe rash involving mucus membranes or skin necrosis: No Has patient had a PCN reaction that required hospitalization No Has  patient had a PCN reaction occurring within the last 10 years: No If all of the above answers are "NO", then may proceed with Cephalosporin use.   . Sulfa Antibiotics Hives    Current Outpatient Medications on File Prior to Visit  Medication Sig Dispense Refill  . aspirin 81 MG tablet Take 1 tablet (81 mg total) by mouth daily. 30 tablet   . cholecalciferol (VITAMIN D) 1000 units tablet Take 1,000 Units by mouth daily.    Marland Kitchen levothyroxine (SYNTHROID, LEVOTHROID) 100 MCG tablet Take 100 mcg by mouth daily before breakfast.     . pantoprazole (PROTONIX) 40 MG tablet take 1 tablet once daily 30 tablet 5   No current facility-administered medications on file prior to visit.         Objective:   Physical Exam Blood pressure 118/64, pulse 68, temperature 97.7 F (36.5 C), height 5\' 2"  (1.575 m), weight 176 lb 6.4 oz (80 kg).  Alert and oriented. Skin warm and dry. Oral mucosa is moist. Dentures noted . Sclera anicteric, conjunctivae is pink. Thyroid not enlarged. No cervical lymphadenopathy. Lungs clear. Heart regular rate and rhythm.  Abdomen is soft. Bowel sounds are positive. No hepatomegaly. No abdominal masses felt. No tenderness.  No edema to lower extremities.         Assessment & Plan:  Food impaction/dysphagia. Will schedule an EGD/ED as early as possible. Patient to remain on a liquid to soft diet

## 2017-11-07 ENCOUNTER — Encounter (HOSPITAL_COMMUNITY): Payer: Self-pay | Admitting: Internal Medicine

## 2017-11-19 ENCOUNTER — Other Ambulatory Visit (INDEPENDENT_AMBULATORY_CARE_PROVIDER_SITE_OTHER): Payer: Self-pay | Admitting: *Deleted

## 2017-11-19 ENCOUNTER — Encounter (HOSPITAL_COMMUNITY)
Admission: RE | Disposition: A | Discharge status: Home / Self Care | Payer: Self-pay | Source: Ambulatory Visit | Attending: Internal Medicine

## 2017-11-19 ENCOUNTER — Other Ambulatory Visit: Payer: Self-pay

## 2017-11-19 ENCOUNTER — Encounter (INDEPENDENT_AMBULATORY_CARE_PROVIDER_SITE_OTHER): Payer: Self-pay | Admitting: *Deleted

## 2017-11-19 ENCOUNTER — Encounter (HOSPITAL_COMMUNITY): Payer: Self-pay | Admitting: *Deleted

## 2017-11-19 ENCOUNTER — Ambulatory Visit (HOSPITAL_COMMUNITY)
Admission: RE | Admit: 2017-11-19 | Discharge: 2017-11-19 | Disposition: A | Discharge status: Home / Self Care | Payer: Medicare Other | Source: Ambulatory Visit | Attending: Internal Medicine | Admitting: Internal Medicine

## 2017-11-19 DIAGNOSIS — K222 Esophageal obstruction: Secondary | ICD-10-CM | POA: Insufficient documentation

## 2017-11-19 DIAGNOSIS — Z9842 Cataract extraction status, left eye: Secondary | ICD-10-CM | POA: Diagnosis not present

## 2017-11-19 DIAGNOSIS — Z7982 Long term (current) use of aspirin: Secondary | ICD-10-CM | POA: Diagnosis not present

## 2017-11-19 DIAGNOSIS — Z85820 Personal history of malignant melanoma of skin: Secondary | ICD-10-CM | POA: Insufficient documentation

## 2017-11-19 DIAGNOSIS — K449 Diaphragmatic hernia without obstruction or gangrene: Secondary | ICD-10-CM | POA: Insufficient documentation

## 2017-11-19 DIAGNOSIS — M160 Bilateral primary osteoarthritis of hip: Secondary | ICD-10-CM | POA: Insufficient documentation

## 2017-11-19 DIAGNOSIS — Z79899 Other long term (current) drug therapy: Secondary | ICD-10-CM | POA: Insufficient documentation

## 2017-11-19 DIAGNOSIS — K219 Gastro-esophageal reflux disease without esophagitis: Secondary | ICD-10-CM | POA: Diagnosis not present

## 2017-11-19 DIAGNOSIS — Z800 Family history of malignant neoplasm of digestive organs: Secondary | ICD-10-CM | POA: Insufficient documentation

## 2017-11-19 DIAGNOSIS — Z881 Allergy status to other antibiotic agents status: Secondary | ICD-10-CM | POA: Insufficient documentation

## 2017-11-19 DIAGNOSIS — Z880 Allergy status to penicillin: Secondary | ICD-10-CM | POA: Insufficient documentation

## 2017-11-19 DIAGNOSIS — M4690 Unspecified inflammatory spondylopathy, site unspecified: Secondary | ICD-10-CM | POA: Diagnosis not present

## 2017-11-19 DIAGNOSIS — K31819 Angiodysplasia of stomach and duodenum without bleeding: Secondary | ICD-10-CM | POA: Insufficient documentation

## 2017-11-19 DIAGNOSIS — Z9841 Cataract extraction status, right eye: Secondary | ICD-10-CM | POA: Insufficient documentation

## 2017-11-19 DIAGNOSIS — Z8546 Personal history of malignant neoplasm of prostate: Secondary | ICD-10-CM | POA: Insufficient documentation

## 2017-11-19 DIAGNOSIS — E039 Hypothyroidism, unspecified: Secondary | ICD-10-CM | POA: Insufficient documentation

## 2017-11-19 DIAGNOSIS — Z882 Allergy status to sulfonamides status: Secondary | ICD-10-CM | POA: Insufficient documentation

## 2017-11-19 DIAGNOSIS — Z96653 Presence of artificial knee joint, bilateral: Secondary | ICD-10-CM | POA: Insufficient documentation

## 2017-11-19 DIAGNOSIS — Z87891 Personal history of nicotine dependence: Secondary | ICD-10-CM | POA: Insufficient documentation

## 2017-11-19 DIAGNOSIS — M19012 Primary osteoarthritis, left shoulder: Secondary | ICD-10-CM | POA: Diagnosis not present

## 2017-11-19 DIAGNOSIS — Z8379 Family history of other diseases of the digestive system: Secondary | ICD-10-CM | POA: Diagnosis not present

## 2017-11-19 DIAGNOSIS — Z8042 Family history of malignant neoplasm of prostate: Secondary | ICD-10-CM | POA: Insufficient documentation

## 2017-11-19 DIAGNOSIS — R1319 Other dysphagia: Secondary | ICD-10-CM | POA: Insufficient documentation

## 2017-11-19 DIAGNOSIS — T18128A Food in esophagus causing other injury, initial encounter: Secondary | ICD-10-CM | POA: Insufficient documentation

## 2017-11-19 DIAGNOSIS — Z803 Family history of malignant neoplasm of breast: Secondary | ICD-10-CM | POA: Diagnosis not present

## 2017-11-19 DIAGNOSIS — M19011 Primary osteoarthritis, right shoulder: Secondary | ICD-10-CM | POA: Insufficient documentation

## 2017-11-19 DIAGNOSIS — R1310 Dysphagia, unspecified: Secondary | ICD-10-CM | POA: Insufficient documentation

## 2017-11-19 HISTORY — PX: ESOPHAGEAL DILATION: SHX303

## 2017-11-19 HISTORY — PX: ESOPHAGOGASTRODUODENOSCOPY: SHX5428

## 2017-11-19 SURGERY — EGD (ESOPHAGOGASTRODUODENOSCOPY)
Anesthesia: Moderate Sedation

## 2017-11-19 MED ORDER — STERILE WATER FOR IRRIGATION IR SOLN
Status: DC | PRN
  Administered 2017-11-19: 08:00:00

## 2017-11-19 MED ORDER — MIDAZOLAM HCL 5 MG/5ML IJ SOLN
INTRAMUSCULAR | Status: DC | PRN
  Administered 2017-11-19 (×2): 1 mg via INTRAVENOUS

## 2017-11-19 MED ORDER — SODIUM CHLORIDE 0.9 % IV SOLN
INTRAVENOUS | Status: DC
  Administered 2017-11-19: 07:00:00 via INTRAVENOUS

## 2017-11-19 MED ORDER — MIDAZOLAM HCL 5 MG/5ML IJ SOLN
INTRAMUSCULAR | Status: AC
  Filled 2017-11-19: qty 10

## 2017-11-19 MED ORDER — LIDOCAINE VISCOUS HCL 2 % MT SOLN
OROMUCOSAL | Status: AC
  Filled 2017-11-19: qty 15

## 2017-11-19 MED ORDER — LIDOCAINE VISCOUS HCL 2 % MT SOLN
OROMUCOSAL | Status: DC | PRN
  Administered 2017-11-19: 4 mL via OROMUCOSAL

## 2017-11-19 MED ORDER — MEPERIDINE HCL 50 MG/ML IJ SOLN
INTRAMUSCULAR | Status: DC | PRN
  Administered 2017-11-19: 25 mg via INTRAVENOUS

## 2017-11-19 MED ORDER — MEPERIDINE HCL 50 MG/ML IJ SOLN
INTRAMUSCULAR | Status: AC
  Filled 2017-11-19: qty 1

## 2017-11-19 NOTE — Op Note (Signed)
[facility] Patient Name: Evan Miller Procedure Date: 11/19/2017 7:06 AM MRN: [id number] Date of Birth: 1925/10/01 Attending MD: Hildred Laser , MD CSN: [id number] Age: 84 Admit Type: Outpatient Procedure:                Upper GI endoscopy Indications:              For therapy of esophageal stricture Providers:                Hildred Laser, MD, Otis Peak B. Sharon Seller, RN, Aram Candela Referring MD:             Doree Albee, MD Medicines:                Lidocaine spray, Meperidine 25 mg IV, Midazolam 2                            mg IV Complications:            No immediate complications. Estimated Blood Loss:     Estimated blood loss was minimal. Procedure:                Pre-Anesthesia Assessment:                           - Prior to the procedure, a History and Physical                            was performed, and patient medications and                            allergies were reviewed. The patient's tolerance of                            previous anesthesia was also reviewed. The risks                            and benefits of the procedure and the sedation                            options and risks were discussed with the patient.                            All questions were answered, and informed consent                            was obtained. Prior Anticoagulants: The patient                            last took aspirin 3 days prior to the procedure.                            ASA Grade Assessment: II - A patient with mild  systemic disease. After reviewing the risks and                            benefits, the patient was deemed in satisfactory                            condition to undergo the procedure.                           After obtaining informed consent, the endoscope was                            passed under direct vision. Throughout the                            procedure, the patient's blood  pressure, pulse, and                            oxygen saturations were monitored continuously. The                            EG-[id number] ([id number]) scope was introduced through the                            mouth, and advanced to the second part of duodenum.                            The upper GI endoscopy was accomplished without                            difficulty. The patient tolerated the procedure                            well. Scope In: 7:41:42 AM Scope Out: 7:49:27 AM Total Procedure Duration: 0 hours 7 minutes 45 seconds  Findings:      The examined esophagus was normal.      One benign-appearing, intrinsic severe stenosis was found 34 cm from the       incisors. This stenosis measured 9 mm (inner diameter) x less than one       cm (in length). The stenosis was traversed after dilation. A TTS dilator       was passed through the scope. Dilation with a 15-16.5-18 mm balloon       dilator was performed to 15 mm and 16.5 mm. The dilation site was       examined and showed mild mucosal disruption, moderate improvement in       luminal narrowing and no perforation.      A 4 cm hiatal hernia was present.      The entire examined stomach was normal.      The duodenal bulb was normal.      A single small angioectasia without bleeding was found in the second       portion of the duodenum. Impression:               - Normal esophagus.                           -  Benign-appearing esophageal stenosis. Dilated.                           - 4 cm hiatal hernia.                           - Normal stomach.                           - Normal duodenal bulb.                           - A single non-bleeding angioectasia in the                            duodenum.                           - No specimens collected. Moderate Sedation:      Moderate (conscious) sedation was administered by the endoscopy nurse       and supervised by the endoscopist. The following parameters were        monitored: oxygen saturation, heart rate, blood pressure, CO2       capnography and response to care. Total physician intraservice time was       10 minutes. Recommendation:           - Patient has a contact number available for                            emergencies. The signs and symptoms of potential                            delayed complications were discussed with the                            patient. Return to normal activities tomorrow.                            Written discharge instructions were provided to the                            patient.                           - Resume previous diet today.                           - Continue present medications.                           - No aspirin, ibuprofen, naproxen, or other                            non-steroidal anti-inflammatory drugs for 3 days.                           - Repeat upper endoscopy in 6 weeks for retreatment. Procedure Code(s):        ---  Professional ---                           [phone], Esophagogastroduodenoscopy, flexible,                            transoral; with transendoscopic balloon dilation of                            esophagus (less than 30 mm diameter)                           G0500, Moderate sedation services provided by the                            same physician or other qualified health care                            professional performing a gastrointestinal                            endoscopic service that sedation supports,                            requiring the presence of an independent trained                            observer to assist in the monitoring of the                            patient's level of consciousness and physiological                            status; initial 15 minutes of intra-service time;                            patient age 11 years or older (additional time may                            be reported with [phone], as appropriate) Diagnosis Code(s):         --- Professional ---                           K22.2, Esophageal obstruction                           K44.9, Diaphragmatic hernia without obstruction or                            gangrene                           K31.819, Angiodysplasia of stomach and duodenum                            without bleeding CPT copyright 2017 American Medical  Association. All rights reserved. The codes documented in this report are preliminary and upon coder review may  be revised to meet current compliance requirements. Hildred Laser, MD Hildred Laser, MD 11/19/2017 7:59:38 AM This report has been signed electronically. Number of Addenda: 0

## 2017-11-19 NOTE — H&P (Signed)
Evan Miller is an 84 y.o. male.   Chief Complaint: Patient is here for EGD and ED. Evan Miller:[id number] is 84 year old Caucasian male with chronic GERD complicated byesophageal stricture who presented 3 weeks ago with esophageal food impaction.He underwent emergency EGD by Evan Miller. Gala Miller.he was noted to have esophageal stricture with inflammatory changes. He recommended elective EGD for dilation. Patient states he has been soft diet He has lost 45 pounds. He states heartburn is well controlled with PPI. He says he is a fast eater which he should not do. He denies nausea vomiting melenaor abdominal pain.  Past Medical History:  Diagnosis Date  . Arthritis    back,shoulders and hips  . Cancer ([place])    basal cell/ Melanoma 1997 left  shoulder/  PROSTATE  . GERD (gastroesophageal reflux disease)   . Hepatitis    age 78 years  . Hypothyroidism   . PONV (postoperative nausea and vomiting)   . Prostate CA [facility])     Past Surgical History:  Procedure Laterality Date  . CATARACT EXTRACTION W/PHACO  12/16/2011   Procedure: CATARACT EXTRACTION PHACO AND INTRAOCULAR LENS PLACEMENT (IOC);  Surgeon: Evan Branch, Evan Miller;  Location: [facility];  Service: Ophthalmology;  Laterality: Left;  CDE:17.24   . CATARACT EXTRACTION W/PHACO  12/30/2011   Procedure: CATARACT EXTRACTION PHACO AND INTRAOCULAR LENS PLACEMENT (IOC);  Surgeon: Evan Branch, Evan Miller;  Location: [facility];  Service: Ophthalmology;  Laterality: Right;  CDE 15.40  . COLONOSCOPY    . COLONOSCOPY N/A 08/07/2012   Procedure: COLONOSCOPY;  Surgeon: Evan Houston, Evan Miller;  Location: [facility];  Service: Endoscopy;  Laterality: N/A;  10:00  . COLONOSCOPY N/A 11/22/2015   Procedure: COLONOSCOPY;  Surgeon: Evan Houston, Evan Miller;  [facility];  Service: Endoscopy;  Laterality: N/A;  12:00 - moved to 10:30 - Ann to notify  . ESOPHAGEAL DILATION N/A 02/10/2015   Procedure: ESOPHAGEAL DILATION;  Surgeon: Evan Houston, Evan Miller;  [facility];  Service: Endoscopy;   Laterality: N/A;  . ESOPHAGEAL DILATION N/A 03/29/2015   Procedure: ESOPHAGEAL DILATION;  Surgeon: Evan Houston, Evan Miller;  [facility];  Service: Endoscopy;  Laterality: N/A;  . ESOPHAGEAL DILATION  11/22/2015   Procedure: ESOPHAGEAL DILATION;  Surgeon: Evan Houston, Evan Miller;  [facility];  Service: Endoscopy;;  . ESOPHAGOGASTRODUODENOSCOPY N/A 02/10/2015   Procedure: ESOPHAGOGASTRODUODENOSCOPY (EGD);  Surgeon: Evan Houston, Evan Miller;  [facility];  Service: Endoscopy;  Laterality: N/A;  925 - moved to 10:20 - Ann to notify pt  . ESOPHAGOGASTRODUODENOSCOPY N/A 03/29/2015   Procedure: ESOPHAGOGASTRODUODENOSCOPY (EGD);  Surgeon: Evan Houston, Evan Miller;  [facility];  Service: Endoscopy;  Laterality: N/A;  145 - moved to 8:30 - Ann notified pt  . ESOPHAGOGASTRODUODENOSCOPY N/A 11/22/2015   Procedure: ESOPHAGOGASTRODUODENOSCOPY (EGD);  Surgeon: Evan Houston, Evan Miller;  [facility];  Service: Endoscopy;  Laterality: N/A;  . ESOPHAGOGASTRODUODENOSCOPY N/A 10/27/2017   Procedure: ESOPHAGOGASTRODUODENOSCOPY (EGD);  Surgeon: Evan Dolin, Evan Miller;  [facility];  Service: Endoscopy;  Laterality: N/A;  removal of food impaction  . HERNIA REPAIR    . HYDROCELE EXCISION     bilateral  . JOINT REPLACEMENT     left knee 9/12  . MELANOMA EXCISION N/A 06/11/2017   Procedure: WIDE LOCAL EXCISION WITH ADVANCEMENT FLAP CLOSURE LEFT NECK MELANOMA;  Surgeon: Evan Klein, Evan Miller;  Location: [facility];  Service: General;  Laterality: N/A;  GENERAL AND LOCAL  .  TOTAL KNEE ARTHROPLASTY  06/24/2011   Procedure: TOTAL KNEE ARTHROPLASTY;  Surgeon: Gearlean Alf;  Location: [facility];  Service: Orthopedics;  Laterality: Right;    Family History  Problem Relation Age of Onset  . Breast cancer Mother   . Prostate cancer Brother   . Nephritis Brother   . Rectal cancer Brother   . Cirrhosis Brother   . Liver cancer Brother   . Healthy Daughter   . Healthy  Son    Social History:  reports that he quit smoking about 55 years ago. His smoking use included cigarettes. He has a 30.00 pack-year smoking history. He quit smokeless tobacco use about 11 years ago. His smokeless tobacco use included chew. He reports that he does not drink alcohol or use drugs.  Allergies:  Allergies  Allergen Reactions  . Ciprofloxacin Hives    Hives at IV infusion site  . Penicillins Other (See Comments)    Reaction: large knots on head Has patient had a PCN reaction causing immediate rash, facial/tongue/throat swelling, SOB or lightheadedness with hypotension: No Has patient had a PCN reaction causing severe rash involving mucus membranes or skin necrosis: No Has patient had a PCN reaction that required hospitalization No Has patient had a PCN reaction occurring within the last 10 years: No If all of the above answers are "NO", then may proceed with Cephalosporin use.   . Sulfa Antibiotics Hives    Medications Prior to Admission  Medication Sig Dispense Refill  . aspirin 81 MG tablet Take 1 tablet (81 mg total) by mouth daily. 30 tablet   . cholecalciferol (VITAMIN D) 1000 units tablet Take 1,000 Units by mouth daily.    . Evan Miller THYROID 90 MG tablet Take 90 mg by mouth daily.  3  . pantoprazole (PROTONIX) 40 MG tablet take 1 tablet once daily 30 tablet 5  . Polyethyl Glycol-Propyl Glycol (SYSTANE OP) Place 1 drop into both eyes at bedtime.      No results found for this or any previous visit (from the past 48 hour(s)). No results found.  ROS  Blood pressure (!) 152/82, pulse 70, temperature 98 F (36.7 C), temperature source Oral, resp. rate 16, height 5\' 2"  (1.575 m), weight 176 lb (79.8 kg), SpO2 100 %. Physical Exam  Constitutional: He appears well-developed and well-nourished.  HENT:  Mouth/Throat: Oropharynx is clear and moist.  He has a vertical scar at forehead. He is edentulous. He does have dentures.  Eyes: Conjunctivae are normal.  Neck: Neck  supple.  Cardiovascular: Normal rate, regular rhythm and normal heart sounds.  No murmur heard. Respiratory: Effort normal and breath sounds normal.  GI: Soft. He exhibits no distension and no mass. There is no tenderness.  Musculoskeletal: He exhibits no edema.  Lymphadenopathy:    He has no cervical adenopathy.  Neurological: He is alert.  Skin: Skin is warm and dry.     Assessment/Plan Esophageal dysphagia secondary to known stricture. Recent food impaction. EGD with ED.  Hildred Laser, Evan Miller 11/19/2017, 7:33 AM

## 2017-11-19 NOTE — Discharge Instructions (Signed)
Resume aspirin on 11/22/2017. Resume other medications as before. Resume usual diet. No driving for 24 hours. Repeats dilation and 4-6 weeks.   Upper Endoscopy, Care After Refer to this sheet in the next few weeks. These instructions provide you with information about caring for yourself after your procedure. Your health care provider may also give you more specific instructions. Your treatment has been planned according to current medical practices, but problems sometimes occur. Call your health care provider if you have any problems or questions after your procedure. What can I expect after the procedure? After the procedure, it is common to have:  A sore throat.  Bloating.  Nausea.  Follow these instructions at home:  Follow instructions from your health care provider about what to eat or drink after your procedure.  Return to your normal activities as told by your health care provider. Ask your health care provider what activities are safe for you.  Take over-the-counter and prescription medicines only as told by your health care provider.  Do not drive for 24 hours if you received a sedative.  Keep all follow-up visits as told by your health care provider. This is important. Contact a health care provider if:  You have a sore throat that lasts longer than one day.  You have trouble swallowing. Get help right away if:  You have a fever.  You vomit blood or your vomit looks like coffee grounds.  You have bloody, black, or tarry stools.  You have a severe sore throat or you cannot swallow.  You have difficulty breathing.  You have severe pain in your chest or belly. This information is not intended to replace advice given to you by your health care provider. Make sure you discuss any questions you have with your health care provider. Document Released: 11/26/2011 Document Revised: 11/02/2015 Document Reviewed: 03/09/2015 Elsevier Interactive Patient Education  [employer].

## 2017-11-27 ENCOUNTER — Encounter (HOSPITAL_COMMUNITY): Payer: Self-pay | Admitting: Internal Medicine

## 2017-12-29 ENCOUNTER — Encounter (HOSPITAL_COMMUNITY): Payer: Self-pay | Admitting: *Deleted

## 2017-12-29 ENCOUNTER — Other Ambulatory Visit: Payer: Self-pay

## 2017-12-29 ENCOUNTER — Encounter (HOSPITAL_COMMUNITY)
Admission: RE | Disposition: A | Discharge status: Home / Self Care | Payer: Self-pay | Source: Ambulatory Visit | Attending: Internal Medicine

## 2017-12-29 ENCOUNTER — Ambulatory Visit (HOSPITAL_COMMUNITY)
Admission: RE | Admit: 2017-12-29 | Discharge: 2017-12-29 | Disposition: A | Discharge status: Home / Self Care | Payer: Medicare Other | Source: Ambulatory Visit | Attending: Internal Medicine | Admitting: Internal Medicine

## 2017-12-29 DIAGNOSIS — Z8619 Personal history of other infectious and parasitic diseases: Secondary | ICD-10-CM | POA: Insufficient documentation

## 2017-12-29 DIAGNOSIS — K3189 Other diseases of stomach and duodenum: Secondary | ICD-10-CM

## 2017-12-29 DIAGNOSIS — E039 Hypothyroidism, unspecified: Secondary | ICD-10-CM | POA: Diagnosis not present

## 2017-12-29 DIAGNOSIS — Z881 Allergy status to other antibiotic agents status: Secondary | ICD-10-CM | POA: Insufficient documentation

## 2017-12-29 DIAGNOSIS — Z882 Allergy status to sulfonamides status: Secondary | ICD-10-CM | POA: Diagnosis not present

## 2017-12-29 DIAGNOSIS — M19011 Primary osteoarthritis, right shoulder: Secondary | ICD-10-CM | POA: Insufficient documentation

## 2017-12-29 DIAGNOSIS — Z85828 Personal history of other malignant neoplasm of skin: Secondary | ICD-10-CM | POA: Insufficient documentation

## 2017-12-29 DIAGNOSIS — K222 Esophageal obstruction: Secondary | ICD-10-CM

## 2017-12-29 DIAGNOSIS — K219 Gastro-esophageal reflux disease without esophagitis: Secondary | ICD-10-CM | POA: Insufficient documentation

## 2017-12-29 DIAGNOSIS — Z8546 Personal history of malignant neoplasm of prostate: Secondary | ICD-10-CM | POA: Diagnosis not present

## 2017-12-29 DIAGNOSIS — Z96653 Presence of artificial knee joint, bilateral: Secondary | ICD-10-CM | POA: Diagnosis not present

## 2017-12-29 DIAGNOSIS — Z800 Family history of malignant neoplasm of digestive organs: Secondary | ICD-10-CM | POA: Diagnosis not present

## 2017-12-29 DIAGNOSIS — Z880 Allergy status to penicillin: Secondary | ICD-10-CM | POA: Diagnosis not present

## 2017-12-29 DIAGNOSIS — K449 Diaphragmatic hernia without obstruction or gangrene: Secondary | ICD-10-CM | POA: Insufficient documentation

## 2017-12-29 DIAGNOSIS — M479 Spondylosis, unspecified: Secondary | ICD-10-CM | POA: Insufficient documentation

## 2017-12-29 DIAGNOSIS — Z85820 Personal history of malignant melanoma of skin: Secondary | ICD-10-CM | POA: Insufficient documentation

## 2017-12-29 DIAGNOSIS — Z87891 Personal history of nicotine dependence: Secondary | ICD-10-CM | POA: Diagnosis not present

## 2017-12-29 DIAGNOSIS — M160 Bilateral primary osteoarthritis of hip: Secondary | ICD-10-CM | POA: Diagnosis not present

## 2017-12-29 DIAGNOSIS — Z7982 Long term (current) use of aspirin: Secondary | ICD-10-CM | POA: Insufficient documentation

## 2017-12-29 DIAGNOSIS — Z79899 Other long term (current) drug therapy: Secondary | ICD-10-CM | POA: Insufficient documentation

## 2017-12-29 DIAGNOSIS — M19012 Primary osteoarthritis, left shoulder: Secondary | ICD-10-CM | POA: Insufficient documentation

## 2017-12-29 HISTORY — PX: ESOPHAGOGASTRODUODENOSCOPY: SHX5428

## 2017-12-29 HISTORY — PX: ESOPHAGEAL DILATION: SHX303

## 2017-12-29 SURGERY — EGD (ESOPHAGOGASTRODUODENOSCOPY)
Anesthesia: Moderate Sedation

## 2017-12-29 MED ORDER — MEPERIDINE HCL 50 MG/ML IJ SOLN
INTRAMUSCULAR | Status: AC
  Filled 2017-12-29: qty 1

## 2017-12-29 MED ORDER — MIDAZOLAM HCL 5 MG/5ML IJ SOLN
INTRAMUSCULAR | Status: AC
  Filled 2017-12-29: qty 10

## 2017-12-29 MED ORDER — SODIUM CHLORIDE 0.9 % IV SOLN
INTRAVENOUS | Status: DC
  Administered 2017-12-29: 07:00:00 via INTRAVENOUS

## 2017-12-29 MED ORDER — LIDOCAINE VISCOUS HCL 2 % MT SOLN
OROMUCOSAL | Status: AC
  Filled 2017-12-29: qty 15

## 2017-12-29 MED ORDER — MIDAZOLAM HCL 5 MG/5ML IJ SOLN
INTRAMUSCULAR | Status: DC | PRN
  Administered 2017-12-29 (×2): 1 mg via INTRAVENOUS

## 2017-12-29 MED ORDER — MEPERIDINE HCL 50 MG/ML IJ SOLN
INTRAMUSCULAR | Status: DC | PRN
  Administered 2017-12-29: 15 mg

## 2017-12-29 MED ORDER — LIDOCAINE VISCOUS HCL 2 % MT SOLN
OROMUCOSAL | Status: DC | PRN
  Administered 2017-12-29: 1 via OROMUCOSAL

## 2017-12-29 NOTE — Discharge Instructions (Signed)
Esophagogastroduodenoscopy Esophagogastroduodenoscopy (EGD) is a procedure to examine the lining of the esophagus, stomach, and first part of the small intestine (duodenum). This procedure is done to check for problems such as inflammation, bleeding, ulcers, or growths. During this procedure, a long, flexible, lighted tube with a camera attached (endoscope) is inserted down the throat. Tell a health care provider about:  Any allergies you have.  All medicines you are taking, including vitamins, herbs, eye drops, creams, and over-the-counter medicines.  Any problems you or family members have had with anesthetic medicines.  Any blood disorders you have.  Any surgeries you have had.  Any medical conditions you have.  Whether you are pregnant or may be pregnant. What are the risks? Generally, this is a safe procedure. However, problems may occur, including:  Infection.  Bleeding.  A tear (perforation) in the esophagus, stomach, or duodenum.  Trouble breathing.  Excessive sweating.  Spasms of the larynx.  A slowed heartbeat.  Low blood pressure.  What happens before the procedure?  Follow instructions from your health care provider about eating or drinking restrictions.  Ask your health care provider about: ? Changing or stopping your regular medicines. This is especially important if you are taking diabetes medicines or blood thinners. ? Taking medicines such as aspirin and ibuprofen. These medicines can thin your blood. Do not take these medicines before your procedure if your health care provider instructs you not to.  Plan to have someone take you home after the procedure.  If you wear dentures, be ready to remove them before the procedure. What happens during the procedure?  To reduce your risk of infection, your health care team will wash or sanitize their hands.  An IV tube will be put in a vein in your hand or arm. You will get medicines and fluids through  this tube.  You will be given one or more of the following: ? A medicine to help you relax (sedative). ? A medicine to numb the area (local anesthetic). This medicine may be sprayed into your throat. It will make you feel more comfortable and keep you from gagging or coughing during the procedure. ? A medicine for pain.  A mouth guard may be placed in your mouth to protect your teeth and to keep you from biting on the endoscope.  You will be asked to lie on your left side.  The endoscope will be lowered down your throat into your esophagus, stomach, and duodenum.  Air will be put into the endoscope. This will help your health care provider see better.  The lining of your esophagus, stomach, and duodenum will be examined.  Your health care provider may: ? Take a tissue sample so it can be looked at in a lab (biopsy). ? Remove growths. ? Remove objects (foreign bodies) that are stuck. ? Treat any bleeding with medicines or other devices that stop tissue from bleeding. ? Widen (dilate) or stretch narrowed areas of your esophagus and stomach.  The endoscope will be taken out. The procedure may vary among health care providers and hospitals. What happens after the procedure?  Your blood pressure, heart rate, breathing rate, and blood oxygen level will be monitored often until the medicines you were given have worn off.  Do not eat or drink anything until the numbing medicine has worn off and your gag reflex has returned. This information is not intended to replace advice given to you by your health care provider. Make sure you discuss any questions  you have with your health care provider. Document Released: 09/27/2004 Document Revised: 11/02/2015 Document Reviewed: 04/20/2015 Elsevier Interactive Patient Education  2018 [employer]. Resume aspirin on 01/01/2018 Resume other medications as before. Resume usual diet. No driving for 24 hours. Call office if swallowing difficulty  recurs.

## 2017-12-29 NOTE — H&P (Signed)
Evan Miller is an 84 y.o. male.   Chief Complaint: Patient is here for EGD and ED. HPI: Patient is a 84 year old Caucasian male who was chronic GERD complicated by esophageal stricture who is here for repeat dilation.  His last dilation was about 6 weeks ago.  Stricture was dilated to 16.5 mm.  He states he has done well but he is watching his foods.  He denies nausea vomiting hematemesis melena or abdominal pain. Last aspirin dose was 3 days ago.  Past Medical History:  Diagnosis Date  . Arthritis    back,shoulders and hips  . Cancer ([place])    basal cell/ Melanoma 1997 left  shoulder/  PROSTATE  . GERD (gastroesophageal reflux disease)   . Hepatitis    age 23 years  . Hypothyroidism   . PONV (postoperative nausea and vomiting)   . Prostate CA [facility])     Past Surgical History:  Procedure Laterality Date  . CATARACT EXTRACTION W/PHACO  12/16/2011   Procedure: CATARACT EXTRACTION PHACO AND INTRAOCULAR LENS PLACEMENT (IOC);  Surgeon: Tonny Branch, MD;  Location: [facility];  Service: Ophthalmology;  Laterality: Left;  CDE:17.24   . CATARACT EXTRACTION W/PHACO  12/30/2011   Procedure: CATARACT EXTRACTION PHACO AND INTRAOCULAR LENS PLACEMENT (IOC);  Surgeon: Tonny Branch, MD;  Location: [facility];  Service: Ophthalmology;  Laterality: Right;  CDE 15.40  . COLONOSCOPY    . COLONOSCOPY N/A 08/07/2012   Procedure: COLONOSCOPY;  Surgeon: Rogene Houston, MD;  [facility];  Service: Endoscopy;  Laterality: N/A;  10:00  . COLONOSCOPY N/A 11/22/2015   Procedure: COLONOSCOPY;  Surgeon: Rogene Houston, MD;  [facility];  Service: Endoscopy;  Laterality: N/A;  12:00 - moved to 10:30 - Ann to notify  . ESOPHAGEAL DILATION N/A 02/10/2015   Procedure: ESOPHAGEAL DILATION;  Surgeon: Rogene Houston, MD;  [facility];  Service: Endoscopy;  Laterality: N/A;  . ESOPHAGEAL DILATION N/A 03/29/2015   Procedure: ESOPHAGEAL DILATION;  Surgeon: Rogene Houston, MD;  [facility];  Service: Endoscopy;  Laterality: N/A;  . ESOPHAGEAL DILATION  11/22/2015   Procedure: ESOPHAGEAL DILATION;  Surgeon: Rogene Houston, MD;  [facility];  Service: Endoscopy;;  . ESOPHAGEAL DILATION N/A 11/19/2017   Procedure: ESOPHAGEAL DILATION;  Surgeon: Rogene Houston, MD;  [facility];  Service: Endoscopy;  Laterality: N/A;  . ESOPHAGOGASTRODUODENOSCOPY N/A 02/10/2015   Procedure: ESOPHAGOGASTRODUODENOSCOPY (EGD);  Surgeon: Rogene Houston, MD;  [facility];  Service: Endoscopy;  Laterality: N/A;  925 - moved to 10:20 - Ann to notify pt  . ESOPHAGOGASTRODUODENOSCOPY N/A 03/29/2015   Procedure: ESOPHAGOGASTRODUODENOSCOPY (EGD);  Surgeon: Rogene Houston, MD;  [facility];  Service: Endoscopy;  Laterality: N/A;  145 - moved to 8:30 - Ann notified pt  . ESOPHAGOGASTRODUODENOSCOPY N/A 11/22/2015   Procedure: ESOPHAGOGASTRODUODENOSCOPY (EGD);  Surgeon: Rogene Houston, MD;  [facility];  Service: Endoscopy;  Laterality: N/A;  . ESOPHAGOGASTRODUODENOSCOPY N/A 10/27/2017   Procedure: ESOPHAGOGASTRODUODENOSCOPY (EGD);  Surgeon: Daneil Dolin, MD;  [facility];  Service: Endoscopy;  Laterality: N/A;  removal of food impaction  . ESOPHAGOGASTRODUODENOSCOPY N/A 11/19/2017   Procedure: ESOPHAGOGASTRODUODENOSCOPY (EGD);  Surgeon: Rogene Houston, MD;  [facility];  Service: Endoscopy;  Laterality: N/A;  7:30  . HERNIA REPAIR    . HYDROCELE EXCISION     bilateral  . JOINT REPLACEMENT     left  knee 9/12  . MELANOMA EXCISION N/A 06/11/2017   Procedure: WIDE LOCAL EXCISION WITH ADVANCEMENT FLAP CLOSURE LEFT NECK MELANOMA;  Surgeon: Stark Klein, MD;  Location: [place];  Service: General;  Laterality: N/A;  GENERAL AND LOCAL  . TOTAL KNEE ARTHROPLASTY  06/24/2011   Procedure: TOTAL KNEE ARTHROPLASTY;  Surgeon: Gearlean Alf;  Location: [facility];  Service: Orthopedics;  Laterality: Right;    Family  History  Problem Relation Age of Onset  . Breast cancer Mother   . Prostate cancer Brother   . Nephritis Brother   . Rectal cancer Brother   . Cirrhosis Brother   . Liver cancer Brother   . Healthy Daughter   . Healthy Son    Social History:  reports that he quit smoking about 55 years ago. His smoking use included cigarettes. He has a 30.00 pack-year smoking history. He quit smokeless tobacco use about 11 years ago. His smokeless tobacco use included chew. He reports that he does not drink alcohol or use drugs.  Allergies:  Allergies  Allergen Reactions  . Ciprofloxacin Hives    Hives at IV infusion site  . Penicillins Other (See Comments)    Reaction: large knots on head Has patient had a PCN reaction causing immediate rash, facial/tongue/throat swelling, SOB or lightheadedness with hypotension: No Has patient had a PCN reaction causing severe rash involving mucus membranes or skin necrosis: No Has patient had a PCN reaction that required hospitalization No Has patient had a PCN reaction occurring within the last 10 years: No If all of the above answers are "NO", then may proceed with Cephalosporin use.   . Sulfa Antibiotics Hives    Medications Prior to Admission  Medication Sig Dispense Refill  . aspirin 81 MG tablet Take 1 tablet (81 mg total) by mouth daily. 30 tablet   . Cholecalciferol (VITAMIN D3) 5000 units CAPS Take 10,000 Units by mouth daily.    . NP THYROID 90 MG tablet Take 90 mg by mouth daily.  3  . pantoprazole (PROTONIX) 40 MG tablet take 1 tablet once daily 30 tablet 5  . Polyethyl Glycol-Propyl Glycol (SYSTANE OP) Place 1 drop into both eyes daily as needed (dry eyes).     Marland Kitchen acetaminophen (TYLENOL) 500 MG tablet Take 500-1,000 mg by mouth daily as needed for moderate pain or headache.      No results found for this or any previous visit (from the past 48 hour(s)). No results found.  ROS  Blood pressure (!) 144/69, pulse 65, temperature 97.9 F (36.6  C), temperature source Oral, resp. rate 17, height 5\' 2"  (1.575 m), weight 176 lb (79.8 kg), SpO2 99 %. Physical Exam  Constitutional: He appears well-developed and well-nourished.  HENT:  Mouth/Throat: Oropharynx is clear and moist.  He has hearing impairment.  Eyes: Conjunctivae are normal. No scleral icterus.  Neck: No thyromegaly present.  Cardiovascular: Normal rate, regular rhythm and normal heart sounds.  No murmur heard. Respiratory: Effort normal and breath sounds normal.  GI: Soft. He exhibits no distension and no mass. There is no tenderness.  Musculoskeletal: He exhibits edema.  There is edema around ankles.  Lymphadenopathy:    He has no cervical adenopathy.  Neurological: He is alert.  Skin: Skin is warm and dry.     Assessment/Plan Distal esophageal stricture. EGD with esophageal dilation. Goal is to dilate the stricture to 18 mm today.  Hildred Laser, MD 12/29/2017, 7:30 AM

## 2017-12-29 NOTE — Op Note (Signed)
[facility] Patient Name: Evan Miller Procedure Date: 12/29/2017 7:08 AM MRN: [id number] Date of Birth: 27-Jul-1925 Attending MD: Hildred Laser , MD CSN: [id number] Age: 84 Admit Type: Outpatient Procedure:                Upper GI endoscopy Indications:              For therapy of esophageal stricture Providers:                Hildred Laser, MD, Janeece Riggers, RN, Aram Candela Referring MD:             Doree Albee, MD Medicines:                Lidocaine spray, Meperidine 15 mg IV, Midazolam 2                            mg IV Complications:            No immediate complications. Estimated Blood Loss:     Estimated blood loss was minimal. Procedure:                Pre-Anesthesia Assessment:                           - Prior to the procedure, a History and Physical                            was performed, and patient medications and                            allergies were reviewed. The patient's tolerance of                            previous anesthesia was also reviewed. The risks                            and benefits of the procedure and the sedation                            options and risks were discussed with the patient.                            All questions were answered, and informed consent                            was obtained. Prior Anticoagulants: The patient                            last took aspirin 3 days prior to the procedure.                            ASA Grade Assessment: II - A patient with mild                            systemic disease. After reviewing the risks and  benefits, the patient was deemed in satisfactory                            condition to undergo the procedure.                           After obtaining informed consent, the endoscope was                            passed under direct vision. Throughout the                            procedure, the patient's blood pressure, pulse, and                             oxygen saturations were monitored continuously. The                            GIF-H190 ([id number]) was introduced through the                            mouth, and advanced to the second part of duodenum.                            The upper GI endoscopy was accomplished without                            difficulty. The patient tolerated the procedure                            well. Scope In: 7:41:16 AM Scope Out: 6:38:75 AM Total Procedure Duration: 0 hours 5 minutes 42 seconds  Findings:      The examined esophagus was normal.      One benign-appearing, intrinsic moderate (circumferential scarring or       stenosis; an endoscope may pass) stenosis was found 34 cm from the       incisors. This stenosis measured less than one cm (in length). The       stenosis was traversed. A TTS dilator was passed through the scope.       Dilation with a 15-16.5-18 mm balloon dilator was performed to 15 mm,       16.5 mm and 18 mm. The dilation site was examined and showed mild       mucosal disruption, complete resolution of luminal narrowing and no       perforation.      A 4 cm hiatal hernia was present.      Patchy mildly erythematous mucosa without bleeding was found in the       gastric antrum.      The exam of the stomach was otherwise normal.      The duodenal bulb and second portion of the duodenum were normal. Impression:               - Normal esophagus except benign-appearing  esophageal stenosis at GEJ. Dilated.                           - 4 cm hiatal hernia.                           - Erythematous mucosa in the antrum.                           - Normal duodenal bulb and second portion of the                            duodenum.                           - No specimens collected. Moderate Sedation:      Moderate (conscious) sedation was administered by the endoscopy nurse       and supervised by the endoscopist. The following parameters were        monitored: oxygen saturation, heart rate, blood pressure, CO2       capnography and response to care. Total physician intraservice time was       10 minutes. Recommendation:           - Patient has a contact number available for                            emergencies. The signs and symptoms of potential                            delayed complications were discussed with the                            patient. Return to normal activities tomorrow.                            Written discharge instructions were provided to the                            patient.                           - Resume previous diet today.                           - Continue present medications.                           - Resume aspirin at prior dose in 3 days.                           - Repeat upper endoscopy as needed.                           . Procedure Code(s):        --- Professional ---  [phone], Esophagogastroduodenoscopy, flexible,                            transoral; with transendoscopic balloon dilation of                            esophagus (less than 30 mm diameter)                           G0500, Moderate sedation services provided by the                            same physician or other qualified health care                            professional performing a gastrointestinal                            endoscopic service that sedation supports,                            requiring the presence of an independent trained                            observer to assist in the monitoring of the                            patient's level of consciousness and physiological                            status; initial 15 minutes of intra-service time;                            patient age 33 years or older (additional time may                            be reported with [phone], as appropriate) Diagnosis Code(s):        --- Professional ---                            K22.2, Esophageal obstruction                           K44.9, Diaphragmatic hernia without obstruction or                            gangrene                           K31.89, Other diseases of stomach and duodenum CPT copyright 2017 [employer]. All rights reserved. The codes documented in this report are preliminary and upon coder review may  be revised to meet current compliance requirements. Hildred Laser, MD Hildred Laser, MD 12/29/2017 7:56:28 AM This report has been signed electronically. Number of Addenda: 0

## 2018-01-01 ENCOUNTER — Encounter (HOSPITAL_COMMUNITY): Payer: Self-pay | Admitting: Internal Medicine

## 2018-02-18 DIAGNOSIS — E039 Hypothyroidism, unspecified: Secondary | ICD-10-CM | POA: Diagnosis not present

## 2018-02-18 DIAGNOSIS — I10 Essential (primary) hypertension: Secondary | ICD-10-CM | POA: Diagnosis not present

## 2018-02-18 DIAGNOSIS — R5383 Other fatigue: Secondary | ICD-10-CM | POA: Diagnosis not present

## 2018-02-18 DIAGNOSIS — E559 Vitamin D deficiency, unspecified: Secondary | ICD-10-CM | POA: Diagnosis not present

## 2018-02-18 DIAGNOSIS — E119 Type 2 diabetes mellitus without complications: Secondary | ICD-10-CM | POA: Diagnosis not present

## 2018-02-18 DIAGNOSIS — Z23 Encounter for immunization: Secondary | ICD-10-CM | POA: Diagnosis not present

## 2018-03-06 ENCOUNTER — Ambulatory Visit: Payer: Medicare Other | Admitting: Urology

## 2018-03-06 DIAGNOSIS — C61 Malignant neoplasm of prostate: Secondary | ICD-10-CM | POA: Diagnosis not present

## 2018-03-06 DIAGNOSIS — N401 Enlarged prostate with lower urinary tract symptoms: Secondary | ICD-10-CM

## 2018-03-06 DIAGNOSIS — N3941 Urge incontinence: Secondary | ICD-10-CM

## 2018-03-06 DIAGNOSIS — R351 Nocturia: Secondary | ICD-10-CM

## 2018-03-18 DIAGNOSIS — L03032 Cellulitis of left toe: Secondary | ICD-10-CM | POA: Diagnosis not present

## 2018-03-18 DIAGNOSIS — M79675 Pain in left toe(s): Secondary | ICD-10-CM | POA: Diagnosis not present

## 2018-03-18 DIAGNOSIS — L600 Ingrowing nail: Secondary | ICD-10-CM | POA: Diagnosis not present

## 2018-04-01 DIAGNOSIS — L600 Ingrowing nail: Secondary | ICD-10-CM | POA: Diagnosis not present

## 2018-04-01 DIAGNOSIS — M79675 Pain in left toe(s): Secondary | ICD-10-CM | POA: Diagnosis not present

## 2018-04-01 DIAGNOSIS — L03032 Cellulitis of left toe: Secondary | ICD-10-CM | POA: Diagnosis not present

## 2018-05-05 DIAGNOSIS — Z85820 Personal history of malignant melanoma of skin: Secondary | ICD-10-CM | POA: Diagnosis not present

## 2018-05-05 DIAGNOSIS — L570 Actinic keratosis: Secondary | ICD-10-CM | POA: Diagnosis not present

## 2018-05-05 DIAGNOSIS — L820 Inflamed seborrheic keratosis: Secondary | ICD-10-CM | POA: Diagnosis not present

## 2018-05-05 DIAGNOSIS — L814 Other melanin hyperpigmentation: Secondary | ICD-10-CM | POA: Diagnosis not present

## 2018-05-05 DIAGNOSIS — L821 Other seborrheic keratosis: Secondary | ICD-10-CM | POA: Diagnosis not present

## 2018-05-27 DIAGNOSIS — N183 Chronic kidney disease, stage 3 (moderate): Secondary | ICD-10-CM | POA: Diagnosis not present

## 2018-05-27 DIAGNOSIS — E039 Hypothyroidism, unspecified: Secondary | ICD-10-CM | POA: Diagnosis not present

## 2018-05-27 DIAGNOSIS — L299 Pruritus, unspecified: Secondary | ICD-10-CM | POA: Diagnosis not present

## 2018-07-20 DIAGNOSIS — M545 Low back pain: Secondary | ICD-10-CM | POA: Diagnosis not present

## 2018-08-05 DIAGNOSIS — E039 Hypothyroidism, unspecified: Secondary | ICD-10-CM | POA: Diagnosis not present

## 2018-08-05 DIAGNOSIS — R5383 Other fatigue: Secondary | ICD-10-CM | POA: Diagnosis not present

## 2018-08-05 DIAGNOSIS — E559 Vitamin D deficiency, unspecified: Secondary | ICD-10-CM | POA: Diagnosis not present

## 2018-09-14 ENCOUNTER — Encounter (HOSPITAL_COMMUNITY): Payer: Self-pay | Admitting: Emergency Medicine

## 2018-09-14 ENCOUNTER — Emergency Department (HOSPITAL_COMMUNITY)
Admission: EM | Admit: 2018-09-14 | Discharge: 2018-09-14 | Disposition: A | Discharge status: Home / Self Care | Payer: Medicare Other | Attending: Emergency Medicine | Admitting: Emergency Medicine

## 2018-09-14 ENCOUNTER — Other Ambulatory Visit: Payer: Self-pay

## 2018-09-14 ENCOUNTER — Emergency Department (HOSPITAL_COMMUNITY): Payer: Medicare Other

## 2018-09-14 DIAGNOSIS — Y929 Unspecified place or not applicable: Secondary | ICD-10-CM | POA: Diagnosis not present

## 2018-09-14 DIAGNOSIS — W312XXA Contact with powered woodworking and forming machines, initial encounter: Secondary | ICD-10-CM | POA: Diagnosis not present

## 2018-09-14 DIAGNOSIS — Y9389 Activity, other specified: Secondary | ICD-10-CM | POA: Insufficient documentation

## 2018-09-14 DIAGNOSIS — S61310A Laceration without foreign body of right index finger with damage to nail, initial encounter: Secondary | ICD-10-CM | POA: Diagnosis present

## 2018-09-14 DIAGNOSIS — Z23 Encounter for immunization: Secondary | ICD-10-CM | POA: Insufficient documentation

## 2018-09-14 DIAGNOSIS — E039 Hypothyroidism, unspecified: Secondary | ICD-10-CM | POA: Insufficient documentation

## 2018-09-14 DIAGNOSIS — S62630B Displaced fracture of distal phalanx of right index finger, initial encounter for open fracture: Secondary | ICD-10-CM | POA: Diagnosis not present

## 2018-09-14 DIAGNOSIS — S61210A Laceration without foreign body of right index finger without damage to nail, initial encounter: Secondary | ICD-10-CM

## 2018-09-14 DIAGNOSIS — Z96652 Presence of left artificial knee joint: Secondary | ICD-10-CM | POA: Diagnosis not present

## 2018-09-14 DIAGNOSIS — Z85828 Personal history of other malignant neoplasm of skin: Secondary | ICD-10-CM | POA: Diagnosis not present

## 2018-09-14 DIAGNOSIS — Z79899 Other long term (current) drug therapy: Secondary | ICD-10-CM | POA: Diagnosis not present

## 2018-09-14 DIAGNOSIS — Z8546 Personal history of malignant neoplasm of prostate: Secondary | ICD-10-CM | POA: Insufficient documentation

## 2018-09-14 DIAGNOSIS — Z87891 Personal history of nicotine dependence: Secondary | ICD-10-CM | POA: Insufficient documentation

## 2018-09-14 DIAGNOSIS — S62630A Displaced fracture of distal phalanx of right index finger, initial encounter for closed fracture: Secondary | ICD-10-CM

## 2018-09-14 DIAGNOSIS — Y999 Unspecified external cause status: Secondary | ICD-10-CM | POA: Diagnosis not present

## 2018-09-14 MED ORDER — DOXYCYCLINE HYCLATE 100 MG PO CAPS: 100.0000 mg | ORAL_CAPSULE | Freq: Two times a day (BID) | ORAL | 0 refills | Status: DC

## 2018-09-14 MED ORDER — LIDOCAINE HCL (PF) 2 % IJ SOLN
INTRAMUSCULAR | Status: AC
  Administered 2018-09-14: 10 mL
  Filled 2018-09-14: qty 20

## 2018-09-14 MED ORDER — HYDROCODONE-ACETAMINOPHEN 5-325 MG PO TABS: 1.0000 | ORAL_TABLET | Freq: Four times a day (QID) | ORAL | 0 refills | Status: DC | PRN

## 2018-09-14 MED ORDER — ONDANSETRON HCL 4 MG PO TABS
4.0000 mg | ORAL_TABLET | Freq: Once | ORAL | Status: AC
  Administered 2018-09-14: 4 mg via ORAL
  Filled 2018-09-14: qty 1

## 2018-09-14 MED ORDER — TETANUS-DIPHTH-ACELL PERTUSSIS 5-2.5-18.5 LF-MCG/0.5 IM SUSP
0.5000 mL | Freq: Once | INTRAMUSCULAR | Status: AC
  Administered 2018-09-14: 0.5 mL via INTRAMUSCULAR
  Filled 2018-09-14: qty 0.5

## 2018-09-14 MED ORDER — DOXYCYCLINE HYCLATE 100 MG PO TABS
100.0000 mg | ORAL_TABLET | Freq: Once | ORAL | Status: AC
  Administered 2018-09-14: 100 mg via ORAL
  Filled 2018-09-14: qty 1

## 2018-09-14 MED ORDER — LIDOCAINE HCL (PF) 1 % IJ SOLN
INTRAMUSCULAR | Status: AC
  Filled 2018-09-14: qty 2

## 2018-09-14 MED ORDER — ACETAMINOPHEN 500 MG PO TABS
1000.0000 mg | ORAL_TABLET | Freq: Once | ORAL | Status: AC
  Administered 2018-09-14: 1000 mg via ORAL
  Filled 2018-09-14: qty 2

## 2018-09-14 MED ORDER — LIDOCAINE HCL (PF) 1 % IJ SOLN: 10.0000 mL | Freq: Once | INTRAMUSCULAR | Status: DC

## 2018-09-14 NOTE — ED Provider Notes (Signed)
[facility] EMERGENCY DEPARTMENT Provider Note   CSN: [id number] Arrival date & time: 09/14/18  1606    History   Chief Complaint Chief Complaint  Patient presents with   Finger Injury    HPI Evan Miller is a 85 y.o. male.     Patient is a 85 year old male who presents to the emergency department with a complaint of laceration to the index finger.  The patient states he was working with a circular saw, when he accidentally injured the index finger.  He applied a tight dressing to the finger.  His family member is a healthcare provider, and told him that he probably needed a few stitches.  He then came to the emergency department for evaluation.  The patient denies being on any anticoagulation medications.  He is not had any recent operations or procedures involving the right hand.  The history is provided by the patient.    Past Medical History:  Diagnosis Date   Arthritis    back,shoulders and hips   Cancer ([place])    basal cell/ Melanoma 1997 left  shoulder/  PROSTATE   GERD (gastroesophageal reflux disease)    Hepatitis    age 64 years   Hypothyroidism    PONV (postoperative nausea and vomiting)    Prostate CA [facility])     Patient Active Problem List   Diagnosis Date Noted   Esophageal stricture 11/19/2017   Esophageal dysphagia 11/06/2017   Food impaction of esophagus 11/06/2017   Dysphagia 09/27/2015   History of colonic polyps 07/28/2012   Left sided abdominal pain 07/28/2012   Hypothyroidism 07/28/2012   OA (osteoarthritis) of knee 06/24/2011    Past Surgical History:  Procedure Laterality Date   CATARACT EXTRACTION W/PHACO  12/16/2011   Procedure: CATARACT EXTRACTION PHACO AND INTRAOCULAR LENS PLACEMENT ([place]);  Surgeon: Tonny Branch, MD;  Location: [facility];  Service: Ophthalmology;  Laterality: Left;  CDE:17.24    CATARACT EXTRACTION W/PHACO  12/30/2011   Procedure: CATARACT EXTRACTION PHACO AND INTRAOCULAR LENS PLACEMENT ([facility]);  Surgeon:  Tonny Branch, MD;  Location: [facility];  Service: Ophthalmology;  Laterality: Right;  CDE 15.40   COLONOSCOPY     COLONOSCOPY N/A 08/07/2012   Procedure: COLONOSCOPY;  Surgeon: Rogene Houston, MD;  Location: [facility];  Service: Endoscopy;  Laterality: N/A;  10:00   COLONOSCOPY N/A 11/22/2015   Procedure: COLONOSCOPY;  Surgeon: Rogene Houston, MD;  Location: [facility];  Service: Endoscopy;  Laterality: N/A;  12:00 - moved to 10:30 - Ann to notify   ESOPHAGEAL DILATION N/A 02/10/2015   Procedure: ESOPHAGEAL DILATION;  Surgeon: Rogene Houston, MD;  Location: [facility];  Service: Endoscopy;  Laterality: N/A;   ESOPHAGEAL DILATION N/A 03/29/2015   Procedure: ESOPHAGEAL DILATION;  Surgeon: Rogene Houston, MD;  Location: [facility];  Service: Endoscopy;  Laterality: N/A;   ESOPHAGEAL DILATION  11/22/2015   Procedure: ESOPHAGEAL DILATION;  Surgeon: Rogene Houston, MD;  Location: [facility];  Service: Endoscopy;;   ESOPHAGEAL DILATION N/A 11/19/2017   Procedure: ESOPHAGEAL DILATION;  Surgeon: Rogene Houston, MD;  [facility];  Service: Endoscopy;  Laterality: N/A;   ESOPHAGEAL DILATION N/A 12/29/2017   Procedure: ESOPHAGEAL DILATION;  Surgeon: Rogene Houston, MD;  [facility];  Service: Endoscopy;  Laterality: N/A;   ESOPHAGOGASTRODUODENOSCOPY N/A 02/10/2015   Procedure: ESOPHAGOGASTRODUODENOSCOPY (EGD);  Surgeon: Rogene Houston, MD;  [facility];  Service: Endoscopy;  Laterality: N/A;  925 - moved to 10:20 - Ann to notify pt   ESOPHAGOGASTRODUODENOSCOPY N/A 03/29/2015   Procedure: ESOPHAGOGASTRODUODENOSCOPY (EGD);  Surgeon: Rogene Houston, MD;  [facility];  Service: Endoscopy;  Laterality: N/A;  145 - moved to 8:30 - Ann notified pt   ESOPHAGOGASTRODUODENOSCOPY N/A 11/22/2015   Procedure: ESOPHAGOGASTRODUODENOSCOPY (EGD);  Surgeon: Rogene Houston, MD;  [facility];  Service: Endoscopy;  Laterality: N/A;    ESOPHAGOGASTRODUODENOSCOPY N/A 10/27/2017   Procedure: ESOPHAGOGASTRODUODENOSCOPY (EGD);  Surgeon: Daneil Dolin, MD;  [facility];  Service: Endoscopy;  Laterality: N/A;  removal of food impaction   ESOPHAGOGASTRODUODENOSCOPY N/A 11/19/2017   Procedure: ESOPHAGOGASTRODUODENOSCOPY (EGD);  Surgeon: Rogene Houston, MD;  [facility];  Service: Endoscopy;  Laterality: N/A;  7:30   ESOPHAGOGASTRODUODENOSCOPY N/A 12/29/2017   Procedure: ESOPHAGOGASTRODUODENOSCOPY (EGD);  Surgeon: Rogene Houston, MD;  [facility];  Service: Endoscopy;  Laterality: N/A;  [place]     bilateral   JOINT REPLACEMENT     left knee 9/12   MELANOMA EXCISION N/A 06/11/2017   Procedure: WIDE LOCAL EXCISION WITH ADVANCEMENT FLAP CLOSURE LEFT NECK MELANOMA;  Surgeon: Stark Klein, MD;  Location: [place];  Service: General;  Laterality: N/A;  GENERAL AND LOCAL   TOTAL KNEE ARTHROPLASTY  06/24/2011   Procedure: TOTAL KNEE ARTHROPLASTY;  Surgeon: Gearlean Alf;  Location: [facility];  Service: Orthopedics;  Laterality: Right;        Home Medications    Prior to Admission medications   Medication Sig Start Date End Date Taking? Authorizing Provider  acetaminophen (TYLENOL) 500 MG tablet Take 500-1,000 mg by mouth daily as needed for moderate pain or headache.    [provider]  aspirin 81 MG tablet Take 1 tablet (81 mg total) by mouth daily. 01/01/18   Rogene Houston, MD  Cholecalciferol (VITAMIN D3) 5000 units CAPS Take 10,000 Units by mouth daily.    [provider]  NP THYROID 90 MG tablet Take 90 mg by mouth daily. 11/01/17   [provider]  pantoprazole (PROTONIX) 40 MG tablet take 1 tablet once daily 04/11/16   Setzer, Rona Ravens, NP  Polyethyl Glycol-Propyl Glycol (SYSTANE OP) Place 1 drop into both eyes daily as needed (dry eyes).     [provider]    Family History Family History    Problem Relation Age of Onset   Breast cancer Mother    Prostate cancer Brother    Nephritis Brother    Rectal cancer Brother    Cirrhosis Brother    Liver cancer Brother    Healthy Daughter    Healthy Son     Social History Social History   Tobacco Use   Smoking status: Former Smoker    Packs/day: 1.00    Years: 30.00    Pack years: 30.00    Types: Cigarettes    Last attempt to quit: 06/16/1962    Years since quitting: 56.2   Smokeless tobacco: Former Systems developer    Types: Chew    Quit date: 06/16/2006  Substance Use Topics   Alcohol use: No    Comment: quit  60 yrs ago   Drug use: No     Allergies   Ciprofloxacin; Penicillins; and Sulfa antibiotics   Review of Systems Review of Systems  Constitutional: Negative for activity change.       All ROS Neg except as  noted in HPI  HENT: Negative for nosebleeds.   Eyes: Negative for photophobia and discharge.  Respiratory: Negative for cough, shortness of breath and wheezing.   Cardiovascular: Negative for chest pain and palpitations.  Gastrointestinal: Negative for abdominal pain and blood in stool.  Genitourinary: Negative for dysuria, frequency and hematuria.  Musculoskeletal: Positive for arthralgias. Negative for back pain and neck pain.  Skin: Negative.   Neurological: Negative for dizziness, seizures and speech difficulty.  Psychiatric/Behavioral: Negative for confusion and hallucinations.     Physical Exam Updated Vital Signs BP 104/60    Pulse 74    Temp 97.6 F (36.4 C) (Tympanic)    Resp 18    Ht 5\' 8"  (1.727 m)    Wt 81.6 kg    SpO2 99%    BMI 27.37 kg/m   Physical Exam Vitals signs and nursing note reviewed.  Constitutional:      Appearance: He is well-developed. He is not toxic-appearing.  HENT:     Head: Normocephalic.     Right Ear: Tympanic membrane and external ear normal.     Left Ear: Tympanic membrane and external ear normal.  Eyes:     General: Lids are normal.     Pupils: Pupils  are equal, round, and reactive to light.  Neck:     Musculoskeletal: Normal range of motion and neck supple.     Vascular: No carotid bruit.  Cardiovascular:     Rate and Rhythm: Normal rate and regular rhythm.     Pulses: Normal pulses.     Heart sounds: Normal heart sounds.  Pulmonary:     Effort: No respiratory distress.     Breath sounds: Normal breath sounds.  Abdominal:     General: Bowel sounds are normal.     Palpations: Abdomen is soft.     Tenderness: There is no abdominal tenderness. There is no guarding.  Musculoskeletal: Normal range of motion.        General: Signs of injury present.       Hands:  Lymphadenopathy:     Head:     Right side of head: No submandibular adenopathy.     Left side of head: No submandibular adenopathy.     Cervical: No cervical adenopathy.  Skin:    General: Skin is warm and dry.  Neurological:     Mental Status: He is alert and oriented to person, place, and time.     Cranial Nerves: No cranial nerve deficit.     Sensory: No sensory deficit.  Psychiatric:        Speech: Speech normal.      ED Treatments / Results  Labs (all labs ordered are listed, but only abnormal results are displayed) Labs Reviewed - No data to display  EKG None  Radiology No results found.  Procedures .Marland KitchenLaceration Repair Date/Time: 09/14/2018 5:44 PM Performed by: Lily Kocher, PA-C Authorized by: Lily Kocher, PA-C   Consent:    Consent obtained:  Verbal   Consent given by:  Patient   Risks discussed:  Infection, pain, retained foreign body, poor cosmetic result and poor wound healing Universal protocol:    Procedure explained and questions answered to patient or proxy's satisfaction: yes     Imaging studies available: yes     Immediately prior to procedure, a time out was called: yes     Patient identity confirmed:  Arm band Anesthesia (see MAR for exact dosages):    Anesthesia method:  Nerve block   Block  location:  Right index finger    Block needle gauge:  25 G   Block anesthetic:  Lidocaine 2% w/o epi   Block technique:  Digital   Block injection procedure:  Anatomic landmarks identified, introduced needle, incremental injection and anatomic landmarks palpated   Block outcome:  Anesthesia achieved Laceration details:    Location:  Finger   Finger location:  R index finger   Length (cm):  2 Repair type:    Repair type:  Intermediate Pre-procedure details:    Preparation:  Patient was prepped and draped in usual sterile fashion and imaging obtained to evaluate for foreign bodies Exploration:    Hemostasis achieved with:  Direct pressure   Wound exploration: wound explored through full range of motion     Wound extent: underlying fracture     Wound extent: no tendon damage noted     Wound extent comment:  Nail of index finger involved   Contaminated: no   Treatment:    Area cleansed with:  Betadine   Amount of cleaning:  Extensive   Irrigation solution:  Sterile saline   Irrigation volume:  556ml   Irrigation method:  Syringe   Visualized foreign bodies/material removed: no   Skin repair:    Repair method:  Sutures   Suture size:  4-0   Wound skin closure material used: Vicryl Rapide.   Suture technique:  Simple interrupted   Number of sutures:  7 Approximation:    Approximation:  Close Post-procedure details:    Dressing:  Sterile dressing and splint for protection   Patient tolerance of procedure:  Tolerated well, no immediate complications   (including critical care time)  Medications Ordered in ED Medications  lidocaine (PF) (XYLOCAINE) 1 % injection 10 mL (has no administration in time range)  acetaminophen (TYLENOL) tablet 1,000 mg (has no administration in time range)  ondansetron (ZOFRAN) tablet 4 mg (has no administration in time range)  doxycycline (VIBRA-TABS) tablet 100 mg (has no administration in time range)     Initial Impression / Assessment and Plan / ED Course  I have reviewed the  triage vital signs and the nursing notes.  Pertinent labs & imaging results that were available during my care of the patient were reviewed by me and considered in my medical decision making (see chart for details).        Patient seen with me by Dr. Eulis Foster Final Clinical Impressions(s) / ED Diagnoses MDM  Vital signs are within normal limits.  Pulse oximetry is 99% on room air.  Within normal limits by my interpretation.  Patient sustained a laceration with a circular saw.  Patient started on antibiotics.  X-ray obtained to evaluate the distal tuft.  X-ray of the index finger shows a focal splinter type fracture of the distal aspect of the right fourth digit.  There is also noted a radiopaque density within the laceration that questions a possible bone fragment.  The patient was started on doxycycline.  A finger splint was applied for protection.  The patient is referred to orthopedics for evaluation and management.  I have informed the patient that the sutures were dissolvable, but to return to the emergency department immediately if any unusual red streaks going up the hand, pus like drainage from the suture site, signs of advancing infection, changes in his general condition, problems or concerns.  Patient is in agreement with this plan.   Final diagnoses:  Closed displaced fracture of distal phalanx of right index finger, initial encounter  Laceration of right index finger without foreign body, nail damage status unspecified, initial encounter    ED Discharge Orders         Ordered    doxycycline (VIBRAMYCIN) 100 MG capsule  2 times daily     09/14/18 1838    HYDROcodone-acetaminophen (NORCO/VICODIN) 5-325 MG tablet  Every 6 hours PRN     09/14/18 1838           Lily Kocher, PA-C 09/15/18 1214    Daleen Bo, MD 09/19/18 1255

## 2018-09-14 NOTE — ED Notes (Signed)
Patient given discharge instruction, verbalized understand. Patient ambulatory out of the department.  

## 2018-09-14 NOTE — Discharge Instructions (Addendum)
You have a tiny splinter fracture of the tip of your index finger as a result of the saw injury.  You have a laceration of that same finger.  Please use the doxycycline 2 times daily with food until all taken.  Please call Dr. Aline Brochure for an orthopedic evaluation of your finger.  Your wound was repaired with dissolvable stitches.  These should come out in about 7 to 10 days.  Please see your primary physician or return to the emergency department if any pus like drainage from the area, or red streaks going up your hand, fever, or changes in your general condition.  Use Tylenol every 4 hours for mild pain, use Norco for severe pain.  This medication may cause drowsiness, and/or lightheadedness.  Please do not handle machinery, drive a vehicle, or participate in activities requiring concentration when taking this medication.  Please use the splint until seen by Dr. Aline Brochure or an orthopedic consultant.  Change the dressing daily.

## 2018-09-14 NOTE — ED Notes (Signed)
Pt ambulatory to The bathroom

## 2018-09-14 NOTE — ED Triage Notes (Signed)
Patient was working with circular saw and cut right hand index finger.

## 2018-09-14 NOTE — ED Provider Notes (Signed)
  Face-to-face evaluation   History: Patient accidentally nicked his finger with a circular saw, running, the right second finger.  Physical exam: Alert, elderly male.  Right second finger with distal longitudinal laceration, ulnar aspect of distal finger, entering nail proper.  Intact flexion.  Neurovascularly intact.  Medical screening examination/treatment/procedure(s) were conducted as a shared visit with non-physician practitioner(s) and myself.  I personally evaluated the patient during the encounter   Daleen Bo, MD 09/19/18 1255

## 2018-10-30 ENCOUNTER — Ambulatory Visit (INDEPENDENT_AMBULATORY_CARE_PROVIDER_SITE_OTHER): Payer: Medicare Other | Admitting: Urology

## 2018-10-30 DIAGNOSIS — R9721 Rising PSA following treatment for malignant neoplasm of prostate: Secondary | ICD-10-CM | POA: Diagnosis not present

## 2018-10-30 DIAGNOSIS — N401 Enlarged prostate with lower urinary tract symptoms: Secondary | ICD-10-CM

## 2018-10-30 DIAGNOSIS — R351 Nocturia: Secondary | ICD-10-CM | POA: Diagnosis not present

## 2018-10-30 DIAGNOSIS — C61 Malignant neoplasm of prostate: Secondary | ICD-10-CM

## 2018-10-30 DIAGNOSIS — N3941 Urge incontinence: Secondary | ICD-10-CM

## 2019-05-04 ENCOUNTER — Other Ambulatory Visit (HOSPITAL_COMMUNITY): Payer: Self-pay | Admitting: Internal Medicine

## 2019-07-06 ENCOUNTER — Other Ambulatory Visit (HOSPITAL_COMMUNITY): Payer: Self-pay | Admitting: Internal Medicine

## 2019-07-06 DIAGNOSIS — R1310 Dysphagia, unspecified: Secondary | ICD-10-CM

## 2019-08-12 ENCOUNTER — Encounter (INDEPENDENT_AMBULATORY_CARE_PROVIDER_SITE_OTHER): Payer: Self-pay | Admitting: Nurse Practitioner

## 2019-08-12 ENCOUNTER — Other Ambulatory Visit: Payer: Self-pay

## 2019-08-12 ENCOUNTER — Ambulatory Visit (INDEPENDENT_AMBULATORY_CARE_PROVIDER_SITE_OTHER): Payer: Medicare Other | Admitting: Nurse Practitioner

## 2019-08-12 VITALS — BP 120/70 | HR 85 | Temp 99.4°F | Ht 65.0 in | Wt 191.8 lb

## 2019-08-12 DIAGNOSIS — R011 Cardiac murmur, unspecified: Secondary | ICD-10-CM | POA: Insufficient documentation

## 2019-08-12 DIAGNOSIS — Z85820 Personal history of malignant melanoma of skin: Secondary | ICD-10-CM

## 2019-08-12 DIAGNOSIS — L0292 Furuncle, unspecified: Secondary | ICD-10-CM | POA: Diagnosis not present

## 2019-08-12 MED ORDER — DOXYCYCLINE HYCLATE 100 MG PO TABS: 100.0000 mg | ORAL_TABLET | Freq: Two times a day (BID) | ORAL | 0 refills | Status: DC

## 2019-08-12 NOTE — Progress Notes (Signed)
Subjective:  Patient ID: Evan Miller, male    DOB: 08-21-1925  Age: 86 y.o. MRN: [id number]  CC:  Chief Complaint  Patient presents with  . sore on the back      HPI  This patient arrives today for a acute visit for the above.  He is accompanied by his daughter today.  She tells me that he has a sore on his back.  She is not sure when it first erupted.  They noticed the sore today.  The patient does say that it is painful.  The daughter tells me that it is draining a little bit.  She is very concerned because of his history of malignant melanoma.   Past Medical History:  Diagnosis Date  . Arthritis    back,shoulders and hips  . Cancer ([place])    basal cell/ Melanoma 1997 left  shoulder/  PROSTATE  . GERD (gastroesophageal reflux disease)   . Hepatitis    age 56 years  . Hypothyroidism   . PONV (postoperative nausea and vomiting)   . Prostate CA [facility])       Family History  Problem Relation Age of Onset  . Breast cancer Mother   . Prostate cancer Brother   . Nephritis Brother   . Rectal cancer Brother   . Cirrhosis Brother   . Liver cancer Brother   . Healthy Daughter   . Healthy Son     Social History   Social History Narrative  . Not on file   Social History   Tobacco Use  . Smoking status: Former Smoker    Packs/day: 1.00    Years: 30.00    Pack years: 30.00    Types: Cigarettes    Quit date: 06/16/1962    Years since quitting: 57.1  . Smokeless tobacco: Former Systems developer    Types: [place] date: 06/16/2006  Substance Use Topics  . Alcohol use: No    Comment: quit  60 yrs ago     Current Meds  Medication Sig  . aspirin 81 MG tablet Take 81 mg by mouth every morning.   . Cholecalciferol (VITAMIN D3) 5000 units CAPS Take 10,000 Units by mouth daily.  . NP THYROID 90 MG tablet TAKE 1 TABLET BY MOUTH DAILY  . pantoprazole (PROTONIX) 40 MG tablet TAKE 1 TABLET BY MOUTH  DAILY    ROS:  Review of Systems  Constitutional: Negative for chills  and fever.  Respiratory: Negative for cough, shortness of breath and wheezing.   Cardiovascular: Negative for chest pain and palpitations.     Objective:   Today's Vitals: BP 120/70 (BP Location: Left Arm, Patient Position: Sitting, Cuff Size: Normal)   Pulse 85   Temp 99.4 F (37.4 C) (Temporal)   Ht 5\' 5"  (1.651 m)   Wt 191 lb 12.8 oz (87 kg)   SpO2 95%   BMI 31.92 kg/m  Vitals with BMI 08/12/2019 09/14/2018 09/14/2018  Height 5\' 5"  - -  Weight 191 lbs 13 oz - -  BMI [id number] - -  Systolic 123456 [id number] 123456  Diastolic 70 76 60  Pulse 85 79 74     Physical Exam Vitals reviewed.  Constitutional:      Appearance: Normal appearance.  HENT:     Head: Normocephalic and atraumatic.  Cardiovascular:     Rate and Rhythm: Normal rate and regular rhythm.     Heart sounds: Murmur present.  Pulmonary:     Effort:  Pulmonary effort is normal.     Breath sounds: Normal breath sounds.  Musculoskeletal:     Cervical back: Neck supple.  Skin:    General: Skin is warm and dry.     Findings: Abscess and erythema present.       Neurological:     Mental Status: He is alert and oriented to person, place, and time.  Psychiatric:        Mood and Affect: Mood normal.        Behavior: Behavior normal.        Thought Content: Thought content normal.        Judgment: Judgment normal.          Assessment   1. Furuncle   2. History of melanoma   3. Murmur       Tests ordered Orders Placed This Encounter  Procedures  . Ambulatory referral to Dermatology     Plan: 1., 2. Patient has allergies to penicillins and sulfa antibiotics.  I will prescribe him a course of doxycycline.  He was also encouraged to apply hot compress 3 times daily.  Due to the patient's daughter's concern of recurrence of skin cancer, I will refer patient back to his dermatologist for further evaluation.  They were told to call the office if his pain or lesion does not improve or worsens within the next 3 to 5  days.  3.  I noted a soft systolic murmur on exam.  Patient and daughter tell me that they have not been told that he has had a murmur in the past.  I will order cardiac echocardiogram for further evaluation.   They will follow up in 2 weeks for close follow-up in addition to office visit.  Meds ordered this encounter  Medications  . doxycycline (VIBRA-TABS) 100 MG tablet    Sig: Take 1 tablet (100 mg total) by mouth 2 (two) times daily.    Dispense:  20 tablet    Refill:  0    Order Specific Question:   Supervising Provider    Answer:   Doree Albee [id number]    Patient to follow-up in 2 weeks  Ailene Ards, NP

## 2019-08-19 ENCOUNTER — Other Ambulatory Visit: Payer: Self-pay

## 2019-08-19 ENCOUNTER — Ambulatory Visit (HOSPITAL_COMMUNITY)
Admission: RE | Admit: 2019-08-19 | Discharge: 2019-08-19 | Disposition: A | Discharge status: Home / Self Care | Payer: Medicare Other | Source: Ambulatory Visit | Attending: Nurse Practitioner | Admitting: Nurse Practitioner

## 2019-08-19 DIAGNOSIS — R011 Cardiac murmur, unspecified: Secondary | ICD-10-CM

## 2019-08-19 NOTE — Progress Notes (Signed)
*  PRELIMINARY RESULTS* Echocardiogram 2D Echocardiogram has been performed.  Evan Miller 08/19/2019, 3:56 PM

## 2019-08-24 ENCOUNTER — Other Ambulatory Visit: Payer: Self-pay

## 2019-08-24 ENCOUNTER — Encounter (INDEPENDENT_AMBULATORY_CARE_PROVIDER_SITE_OTHER): Payer: Self-pay | Admitting: Internal Medicine

## 2019-08-24 ENCOUNTER — Ambulatory Visit (INDEPENDENT_AMBULATORY_CARE_PROVIDER_SITE_OTHER): Payer: Medicare Other | Admitting: Internal Medicine

## 2019-08-24 VITALS — BP 120/70 | HR 87 | Temp 97.6°F | Resp 18 | Ht 67.0 in | Wt 191.0 lb

## 2019-08-24 DIAGNOSIS — E559 Vitamin D deficiency, unspecified: Secondary | ICD-10-CM | POA: Diagnosis not present

## 2019-08-24 DIAGNOSIS — R5381 Other malaise: Secondary | ICD-10-CM | POA: Diagnosis not present

## 2019-08-24 DIAGNOSIS — R5383 Other fatigue: Secondary | ICD-10-CM | POA: Diagnosis not present

## 2019-08-24 DIAGNOSIS — L0292 Furuncle, unspecified: Secondary | ICD-10-CM

## 2019-08-24 MED ORDER — LEVOFLOXACIN 500 MG PO TABS: 500.0000 mg | ORAL_TABLET | Freq: Every day | ORAL | 0 refills | Status: DC

## 2019-08-24 NOTE — Progress Notes (Signed)
Metrics: Intervention Frequency ACO  Documented Smoking Status Yearly  Screened one or more times in 24 months  Cessation Counseling or  Active cessation medication Past 24 months  Past 24 months   Guideline developer: UpToDate (See UpToDate for funding source) Date Released: 2014       Wellness Office Visit  Subjective:  Patient ID: Evan Miller, male    DOB: [birth year]  Age: 86 y.o. MRN: [id number]  CC: This man comes in for follow-up of skin infection he was diagnosed with the last visit with Evan Miller and also follow-up about results from an echocardiogram. HPI  His echocardiogram does show moderate aortic stenosis but the valve area is not less than 1 cm.  He does not have any symptoms of severe aortic stenosis so I am not really convinced that we need to take further action or refer him to cardiology at this point.  I will discuss this with his daughter who is a Designer, jewellery. He has finished a course of doxycycline for his furuncle. Past Medical History:  Diagnosis Date  . Arthritis    back,shoulders and hips  . Cancer ([place])    basal cell/ Melanoma 1997 left  shoulder/  PROSTATE  . GERD (gastroesophageal reflux disease)   . Hepatitis    age 27 years  . Hypothyroidism   . PONV (postoperative nausea and vomiting)   . Prostate CA [facility])       Family History  Problem Relation Age of Onset  . Breast cancer Mother   . Prostate cancer Brother   . Nephritis Brother   . Rectal cancer Brother   . Cirrhosis Brother   . Liver cancer Brother   . Healthy Daughter   . Healthy Son     Social History   Social History Narrative  . Not on file   Social History   Tobacco Use  . Smoking status: Former Smoker    Packs/day: 1.00    Years: 30.00    Pack years: 30.00    Types: Cigarettes    Quit date: 06/16/1962    Years since quitting: 57.2  . Smokeless tobacco: Former Systems developer    Types: [place] date: 06/16/2006  Substance Use Topics  . Alcohol use: No    Comment:  quit  60 yrs ago    Current Meds  Medication Sig  . aspirin 81 MG tablet Take 81 mg by mouth every morning.   . Cholecalciferol (VITAMIN D3) 5000 units CAPS Take 10,000 Units by mouth daily.  Marland Kitchen doxycycline (VIBRA-TABS) 100 MG tablet Take 1 tablet (100 mg total) by mouth 2 (two) times daily.  . NP THYROID 90 MG tablet TAKE 1 TABLET BY MOUTH DAILY  . pantoprazole (PROTONIX) 40 MG tablet TAKE 1 TABLET BY MOUTH  DAILY       Objective:   Today's Vitals: BP 120/70 (BP Location: Right Arm, Patient Position: Sitting, Cuff Size: Normal)   Pulse 87   Temp 97.6 F (36.4 C)   Resp 18   Ht 5\' 7"  (1.702 m)   Wt 191 lb (86.6 kg)   SpO2 98%   BMI 29.91 kg/m  Vitals with BMI 08/24/2019 08/12/2019 09/14/2018  Height 5\' 7"  5\' 5"  -  Weight 191 lbs 191 lbs 13 oz -  BMI [id number] XX123456 -  Systolic 123456 123456 XX123456  Diastolic 70 70 76  Pulse 87 85 79     Physical Exam  He look systemically well.  Blood pressure is well  controlled.  Examination of his upper back shows significant cellulitis still.     Assessment   1. Furuncle   2. Vitamin D deficiency disease   3. Malaise and fatigue       Tests ordered Orders Placed This Encounter  Procedures  . COMPLETE METABOLIC PANEL WITH GFR  . CBC  . VITAMIN D 25 Hydroxy (Vit-D Deficiency, Fractures)  . T3, free  . TSH     Plan: 1. Blood work is ordered. 2. I think he needs further antibiotics for his furuncle/cellulitis.  I will try Levaquin but in the meantime he must see a dermatologist as I am not sure if there is anything more sinister behind this. 3. I will discussed the heart murmur and results of the echocardiogram with his daughter to see if she wants cardiology referral but I do not think at this point we need to do this. 4. Further recommendations will depend on results and follow-up with [name] in 3 months time.   Meds ordered this encounter  Medications  . levofloxacin (LEVAQUIN) 500 MG tablet    Sig: Take 1 tablet (500 mg total)  by mouth daily.    Dispense:  7 tablet    Refill:  0    Alesandra Smart Luther Parody, MD

## 2019-08-25 LAB — CBC
HCT: 38.8 % (ref 38.5–50.0)
Hemoglobin: 12.9 g/dL — ABNORMAL LOW (ref 13.2–17.1)
MCH: 30 pg (ref 27.0–33.0)
MCHC: 33.2 g/dL (ref 32.0–36.0)
MCV: 90.2 fL (ref 80.0–100.0)
MPV: 12 fL (ref 7.5–12.5)
Platelets: 269 10*3/uL (ref 140–400)
RBC: 4.3 10*6/uL (ref 4.20–5.80)
RDW: 13.6 % (ref 11.0–15.0)
WBC: 6.4 10*3/uL (ref 3.8–10.8)

## 2019-08-25 LAB — COMPLETE METABOLIC PANEL WITH GFR
AG Ratio: 1.6 (calc) (ref 1.0–2.5)
ALT: 12 U/L (ref 9–46)
AST: 18 U/L (ref 10–35)
Albumin: 4.2 g/dL (ref 3.6–5.1)
Alkaline phosphatase (APISO): 74 U/L (ref 35–144)
BUN: 15 mg/dL (ref 7–25)
CO2: 25 mmol/L (ref 20–32)
Calcium: 9.8 mg/dL (ref 8.6–10.3)
Chloride: 102 mmol/L (ref 98–110)
Creat: 1.09 mg/dL (ref 0.70–1.11)
GFR, Est African American: 68 mL/min/{1.73_m2} (ref >=60)
GFR, Est Non African American: 59 mL/min/{1.73_m2} — ABNORMAL LOW (ref >=60)
Globulin: 2.6 g/dL (calc) (ref 1.9–3.7)
Glucose, Bld: 97 mg/dL (ref 65–99)
Potassium: 4.2 mmol/L (ref 3.5–5.3)
Sodium: 139 mmol/L (ref 135–146)
Total Bilirubin: 0.6 mg/dL (ref 0.2–1.2)
Total Protein: 6.8 g/dL (ref 6.1–8.1)

## 2019-08-25 LAB — VITAMIN D 25 HYDROXY (VIT D DEFICIENCY, FRACTURES): Vit D, 25-Hydroxy: 43 ng/mL (ref 30–100)

## 2019-08-25 LAB — TSH: TSH: 31.68 mIU/L — ABNORMAL HIGH (ref 0.40–4.50)

## 2019-08-25 LAB — T3, FREE: T3, Free: 2 pg/mL — ABNORMAL LOW (ref 2.3–4.2)

## 2019-08-26 NOTE — Progress Notes (Signed)
Patient called. Ask if he was taking thr thyroid medication. Pt said he is taking all his medications.

## 2019-09-06 ENCOUNTER — Other Ambulatory Visit (HOSPITAL_COMMUNITY): Payer: Self-pay | Admitting: Internal Medicine

## 2019-09-14 ENCOUNTER — Telehealth (INDEPENDENT_AMBULATORY_CARE_PROVIDER_SITE_OTHER): Payer: Self-pay

## 2019-09-14 NOTE — Telephone Encounter (Signed)
Return call to Dr [facility] office. That we would not be able to give this medication to the patient. They also understood, neither have they given in the also.

## 2019-09-14 NOTE — Telephone Encounter (Signed)
As discussed, the dermatology office needs to administer this injection, I have no experience with this medication.

## 2019-09-15 ENCOUNTER — Ambulatory Visit
Admission: EM | Admit: 2019-09-15 | Discharge: 2019-09-15 | Disposition: A | Discharge status: Home / Self Care | Payer: Medicare Other

## 2019-09-15 ENCOUNTER — Other Ambulatory Visit: Payer: Self-pay

## 2019-09-15 NOTE — ED Notes (Signed)
Patient has a documented allergy to penicillin.  Patient reports "knots on head".  Informed patient that provider had reviewed documentation and medical record.  Unable to provide patient with request.

## 2019-09-27 ENCOUNTER — Encounter (HOSPITAL_COMMUNITY): Payer: Self-pay | Admitting: *Deleted

## 2019-09-27 ENCOUNTER — Other Ambulatory Visit: Payer: Self-pay

## 2019-09-27 ENCOUNTER — Emergency Department (HOSPITAL_COMMUNITY)
Admission: EM | Admit: 2019-09-27 | Discharge: 2019-09-27 | Disposition: A | Discharge status: Home / Self Care | Payer: Medicare Other | Attending: Emergency Medicine | Admitting: Emergency Medicine

## 2019-09-27 DIAGNOSIS — Z8546 Personal history of malignant neoplasm of prostate: Secondary | ICD-10-CM | POA: Insufficient documentation

## 2019-09-27 DIAGNOSIS — Z87891 Personal history of nicotine dependence: Secondary | ICD-10-CM | POA: Diagnosis not present

## 2019-09-27 DIAGNOSIS — X58XXXA Exposure to other specified factors, initial encounter: Secondary | ICD-10-CM | POA: Diagnosis not present

## 2019-09-27 DIAGNOSIS — Z96653 Presence of artificial knee joint, bilateral: Secondary | ICD-10-CM | POA: Diagnosis not present

## 2019-09-27 DIAGNOSIS — E039 Hypothyroidism, unspecified: Secondary | ICD-10-CM | POA: Diagnosis not present

## 2019-09-27 DIAGNOSIS — Y9389 Activity, other specified: Secondary | ICD-10-CM | POA: Diagnosis not present

## 2019-09-27 DIAGNOSIS — T18128A Food in esophagus causing other injury, initial encounter: Secondary | ICD-10-CM | POA: Insufficient documentation

## 2019-09-27 DIAGNOSIS — Z7982 Long term (current) use of aspirin: Secondary | ICD-10-CM | POA: Insufficient documentation

## 2019-09-27 DIAGNOSIS — Y999 Unspecified external cause status: Secondary | ICD-10-CM | POA: Diagnosis not present

## 2019-09-27 DIAGNOSIS — Z79899 Other long term (current) drug therapy: Secondary | ICD-10-CM | POA: Insufficient documentation

## 2019-09-27 DIAGNOSIS — Z85820 Personal history of malignant melanoma of skin: Secondary | ICD-10-CM | POA: Insufficient documentation

## 2019-09-27 DIAGNOSIS — Y929 Unspecified place or not applicable: Secondary | ICD-10-CM | POA: Insufficient documentation

## 2019-09-27 DIAGNOSIS — R1310 Dysphagia, unspecified: Secondary | ICD-10-CM | POA: Diagnosis present

## 2019-09-27 LAB — COMPREHENSIVE METABOLIC PANEL
ALT: 18 U/L (ref 0–44)
AST: 21 U/L (ref 15–41)
Albumin: 4.3 g/dL (ref 3.5–5.0)
Alkaline Phosphatase: 59 U/L (ref 38–126)
Anion gap: 10 (ref 5–15)
BUN: 16 mg/dL (ref 8–23)
CO2: 27 mmol/L (ref 22–32)
Calcium: 9.4 mg/dL (ref 8.9–10.3)
Chloride: 102 mmol/L (ref 98–111)
Creatinine, Ser: 1.01 mg/dL (ref 0.61–1.24)
GFR calc Af Amer: >60 mL/min (ref >60)
GFR calc non Af Amer: >60 mL/min (ref >60)
Glucose, Bld: 104 mg/dL — ABNORMAL HIGH (ref 70–99)
Potassium: 4.1 mmol/L (ref 3.5–5.1)
Sodium: 139 mmol/L (ref 135–145)
Total Bilirubin: 0.7 mg/dL (ref 0.3–1.2)
Total Protein: 7.4 g/dL (ref 6.5–8.1)

## 2019-09-27 LAB — CBC WITH DIFFERENTIAL/PLATELET
Abs Immature Granulocytes: 0.01 10*3/uL (ref 0.00–0.07)
Basophils Absolute: 0.1 10*3/uL (ref 0.0–0.1)
Basophils Relative: 2 %
Eosinophils Absolute: 0.2 10*3/uL (ref 0.0–0.5)
Eosinophils Relative: 4 %
HCT: 41.4 % (ref 39.0–52.0)
Hemoglobin: 13.1 g/dL (ref 13.0–17.0)
Immature Granulocytes: 0 %
Lymphocytes Relative: 27 %
Lymphs Abs: 1.3 10*3/uL (ref 0.7–4.0)
MCH: 29.6 pg (ref 26.0–34.0)
MCHC: 31.6 g/dL (ref 30.0–36.0)
MCV: 93.7 fL (ref 80.0–100.0)
Monocytes Absolute: 0.4 10*3/uL (ref 0.1–1.0)
Monocytes Relative: 8 %
Neutro Abs: 3 10*3/uL (ref 1.7–7.7)
Neutrophils Relative %: 59 %
Platelets: 172 10*3/uL (ref 150–400)
RBC: 4.42 MIL/uL (ref 4.22–5.81)
RDW: 14.2 % (ref 11.5–15.5)
WBC: 5 10*3/uL (ref 4.0–10.5)
nRBC: 0 % (ref 0.0–0.2)

## 2019-09-27 MED ORDER — GLUCAGON HCL RDNA (DIAGNOSTIC) 1 MG IJ SOLR
1.0000 mg | Freq: Once | INTRAMUSCULAR | Status: AC
  Administered 2019-09-27: 1 mg via INTRAVENOUS
  Filled 2019-09-27: qty 1

## 2019-09-27 NOTE — ED Provider Notes (Signed)
[facility] EMERGENCY DEPARTMENT Provider Note   CSN: [id number] Arrival date & time: 09/27/19  1152     History Chief Complaint  Patient presents with  . Dysphagia    Evan Miller is a 86 y.o. male.  HPI      Evan Miller is a 86 y.o. male who presents to the Emergency Department complaining of possible food impaction of the esophagus.  States that he was eating chicken yesterday and states that something "got stuck in my throat."  Today, he states he feels a fullness to his throat and upper chest and has had difficulty swallowing fluids.  He states he has had his esophagus stretched several times in the past and his current symptoms feel similar.  He denies chest pain, shortness of breath, fever or chills.  He takes Protonix daily  Past Medical History:  Diagnosis Date  . Arthritis    back,shoulders and hips  . Cancer ([place])    basal cell/ Melanoma 1997 left  shoulder/  PROSTATE  . GERD (gastroesophageal reflux disease)   . Hepatitis    age 19 years  . Hypothyroidism   . PONV (postoperative nausea and vomiting)   . Prostate CA [facility])     Patient Active Problem List   Diagnosis Date Noted  . Murmur 08/12/2019  . Esophageal stricture 11/19/2017  . Esophageal dysphagia 11/06/2017  . Food impaction of esophagus 11/06/2017  . Dysphagia 09/27/2015  . History of colonic polyps 07/28/2012  . Left sided abdominal pain 07/28/2012  . Hypothyroidism 07/28/2012  . OA (osteoarthritis) of knee 06/24/2011    Past Surgical History:  Procedure Laterality Date  . CATARACT EXTRACTION W/PHACO  12/16/2011   Procedure: CATARACT EXTRACTION PHACO AND INTRAOCULAR LENS PLACEMENT (IOC);  Surgeon: Tonny Branch, MD;  Location: [facility];  Service: Ophthalmology;  Laterality: Left;  CDE:17.24   . CATARACT EXTRACTION W/PHACO  12/30/2011   Procedure: CATARACT EXTRACTION PHACO AND INTRAOCULAR LENS PLACEMENT (IOC);  Surgeon: Tonny Branch, MD;  Location: [facility];  Service: Ophthalmology;   Laterality: Right;  CDE 15.40  . COLONOSCOPY    . COLONOSCOPY N/A 08/07/2012   Procedure: COLONOSCOPY;  Surgeon: Rogene Houston, MD;  Location: [facility];  Service: Endoscopy;  Laterality: N/A;  10:00  . COLONOSCOPY N/A 11/22/2015   Procedure: COLONOSCOPY;  Surgeon: Rogene Houston, MD;  Location: [facility];  Service: Endoscopy;  Laterality: N/A;  12:00 - moved to 10:30 - Ann to notify  . ESOPHAGEAL DILATION N/A 02/10/2015   Procedure: ESOPHAGEAL DILATION;  Surgeon: Rogene Houston, MD;  Location: [facility];  Service: Endoscopy;  Laterality: N/A;  . ESOPHAGEAL DILATION N/A 03/29/2015   Procedure: ESOPHAGEAL DILATION;  Surgeon: Rogene Houston, MD;  Location: [facility];  Service: Endoscopy;  Laterality: N/A;  . ESOPHAGEAL DILATION  11/22/2015   Procedure: ESOPHAGEAL DILATION;  Surgeon: Rogene Houston, MD;  Location: [facility];  Service: Endoscopy;;  . ESOPHAGEAL DILATION N/A 11/19/2017   Procedure: ESOPHAGEAL DILATION;  Surgeon: Rogene Houston, MD;  [facility]: [facility];  Service: Endoscopy;  Laterality: N/A;  . ESOPHAGEAL DILATION N/A 12/29/2017   Procedure: ESOPHAGEAL DILATION;  Surgeon: Rogene Houston, MD;  [facility]: [facility];  Service: Endoscopy;  Laterality: N/A;  . ESOPHAGOGASTRODUODENOSCOPY N/A 02/10/2015   Procedure: ESOPHAGOGASTRODUODENOSCOPY (EGD);  Surgeon: Rogene Houston, MD;  Location: [facility];  Service: Endoscopy;  Laterality: N/A;  925 - moved to 10:20 - Ann to notify pt  .  ESOPHAGOGASTRODUODENOSCOPY N/A 03/29/2015   Procedure: ESOPHAGOGASTRODUODENOSCOPY (EGD);  Surgeon: Rogene Houston, MD;  [facility]: [facility];  Service: Endoscopy;  Laterality: N/A;  145 - moved to 8:30 - Ann notified pt  . ESOPHAGOGASTRODUODENOSCOPY N/A 11/22/2015   Procedure: ESOPHAGOGASTRODUODENOSCOPY (EGD);  Surgeon: Rogene Houston, MD;  [facility];  Service: Endoscopy;  Laterality: N/A;  . ESOPHAGOGASTRODUODENOSCOPY N/A 10/27/2017   Procedure:  ESOPHAGOGASTRODUODENOSCOPY (EGD);  Surgeon: Daneil Dolin, MD;  [facility]: [facility];  Service: Endoscopy;  Laterality: N/A;  removal of food impaction  . ESOPHAGOGASTRODUODENOSCOPY N/A 11/19/2017   Procedure: ESOPHAGOGASTRODUODENOSCOPY (EGD);  Surgeon: Rogene Houston, MD;  [facility]: [facility];  Service: Endoscopy;  Laterality: N/A;  7:30  . ESOPHAGOGASTRODUODENOSCOPY N/A 12/29/2017   Procedure: ESOPHAGOGASTRODUODENOSCOPY (EGD);  Surgeon: Rogene Houston, MD;  [facility]: [facility];  Service: Endoscopy;  Laterality: N/A;  730  . HERNIA REPAIR    . HYDROCELE EXCISION     bilateral  . JOINT REPLACEMENT     left knee 9/12  . MELANOMA EXCISION N/A 06/11/2017   Procedure: WIDE LOCAL EXCISION WITH ADVANCEMENT FLAP CLOSURE LEFT NECK MELANOMA;  Surgeon: Stark Klein, MD;  Location: [facility];  Service: General;  Laterality: N/A;  GENERAL AND LOCAL  . TOTAL KNEE ARTHROPLASTY  06/24/2011   Procedure: TOTAL KNEE ARTHROPLASTY;  Surgeon: Gearlean Alf;  Location: [facility];  Service: Orthopedics;  Laterality: Right;       Family History  Problem Relation Age of Onset  . Breast cancer Mother   . Prostate cancer Brother   . Nephritis Brother   . Rectal cancer Brother   . Cirrhosis Brother   . Liver cancer Brother   . Healthy Daughter   . Healthy Son     Social History   Tobacco Use  . Smoking status: Former Smoker    Packs/day: 1.00    Years: 30.00    Pack years: 30.00    Types: Cigarettes    Quit date: 06/16/1962    Years since quitting: 57.3  . Smokeless tobacco: Former Systems developer    Types: [place] date: 06/16/2006  Substance Use Topics  . Alcohol use: No    Comment: quit  60 yrs ago  . Drug use: No    Home Medications Prior to Admission medications   Medication Sig Start Date End Date Taking? Authorizing Provider  aspirin 81 MG tablet Take 81 mg by mouth every morning.  01/01/18   Rehman, Mechele Dawley, MD  Cholecalciferol (VITAMIN D3) 5000 units CAPS Take  10,000 Units by mouth daily.    [provider]  doxycycline (VIBRA-TABS) 100 MG tablet Take 1 tablet (100 mg total) by mouth 2 (two) times daily. 08/12/19   Ailene Ards, NP  levofloxacin (LEVAQUIN) 500 MG tablet Take 1 tablet (500 mg total) by mouth daily. 08/24/19   Doree Albee, MD  NP THYROID 90 MG tablet TAKE 1 TABLET BY MOUTH DAILY 09/07/19   Anastasio Champion, Nimish C, MD  pantoprazole (PROTONIX) 40 MG tablet TAKE 1 TABLET BY MOUTH  DAILY 07/06/19   Doree Albee, MD  NP THYROID 90 MG tablet TAKE 1 TABLET BY MOUTH DAILY 05/04/19   Doree Albee, MD    Allergies    Ciprofloxacin, Penicillins, and Sulfa antibiotics  Review of Systems   Review of Systems  Constitutional: Negative for appetite change, chills and fever.  HENT: Positive for trouble swallowing. Negative for sore throat and  voice change.   Respiratory: Negative for shortness of breath.   Cardiovascular: Negative for chest pain.  Gastrointestinal: Negative for abdominal pain, blood in stool, nausea and vomiting.  Genitourinary: Negative for decreased urine volume, difficulty urinating, dysuria and flank pain.  Musculoskeletal: Negative for back pain.  Skin: Negative for color change and rash.  Neurological: Negative for dizziness, weakness and numbness.  Hematological: Negative for adenopathy.    Physical Exam Updated Vital Signs BP 135/68 (BP Location: Right Arm)   Pulse (!) 58   Temp (!) 97.5 F (36.4 C) (Oral)   Resp 16   SpO2 99%   Physical Exam Vitals and nursing note reviewed.  Constitutional:      General: He is not in acute distress.    Appearance: Normal appearance. He is not ill-appearing.  HENT:     Mouth/Throat:     Mouth: Mucous membranes are moist.     Pharynx: Oropharynx is clear.  Cardiovascular:     Rate and Rhythm: Normal rate and regular rhythm.     Pulses: Normal pulses.  Pulmonary:     Effort: Pulmonary effort is normal.     Breath sounds: Normal breath sounds.  Chest:      Chest wall: No tenderness.  Abdominal:     General: There is no distension.     Palpations: Abdomen is soft.     Tenderness: There is no abdominal tenderness.  Musculoskeletal:        General: Normal range of motion.  Skin:    General: Skin is warm.     Findings: No rash.  Neurological:     General: No focal deficit present.     Mental Status: He is alert.     ED Results / Procedures / Treatments   Labs (all labs ordered are listed, but only abnormal results are displayed) Labs Reviewed  COMPREHENSIVE METABOLIC PANEL - Abnormal; Notable for the following components:      Result Value   Glucose, Bld 104 (*)    All other components within normal limits  CBC WITH DIFFERENTIAL/PLATELET    EKG EKG Interpretation  Date/Time:  Monday September 27 2019 14:59:38 EDT Ventricular Rate:  68 PR Interval:    QRS Duration: 88 QT Interval:  438 QTC Calculation: 465 R Axis:   -39 Text Interpretation: Normal sinus rhythm Left axis deviation Moderate voltage criteria for LVH, may be normal variant ( R in aVL , Cornell product ) Abnormal ECG Confirmed by Virgel Manifold [phone]) on 09/27/2019 3:47:05 PM   Radiology No results found.  Procedures Procedures (including critical care time)  Medications Ordered in ED Medications  glucagon (human recombinant) (GLUCAGEN) injection 1 mg (has no administration in time range)    ED Course  I have reviewed the triage vital signs and the nursing notes.  Pertinent labs & imaging results that were available during my care of the patient were reviewed by me and considered in my medical decision making (see chart for details).    MDM Rules/Calculators/A&P                      Pt with likely food impaction of the esophagus that occurred after eating chicken last evening.   He was given oral fluid challenge here with water and tolerated well.  Differential diagnosis would also include ACS, but this is doubtful given onset yesterday and occurred after  eating chicken.  I will obtain EKG and basic labs  He was also given soda  and IV glucagon, observed in the dept and on recheck reports that he is feeling better and has tolerated oral fluids without difficulty.     Fullness of the mid upper chest reported initially, has now resolved.  Likely food impaction that spontaneously resolved with oral fluids and glucagon.  He appears appropriate for d/c.  He is a pt of Dr. Olevia Perches and agrees to arrange close f/u tomorrow.  Will continue his protonix    Final Clinical Impression(s) / ED Diagnoses Final diagnoses:  Food impaction of esophagus, initial encounter    Rx / DC Orders ED Discharge Orders    None       Kem Parkinson, PA-C 09/27/19 1615    Virgel Manifold, MD 09/29/19 [phone]

## 2019-09-27 NOTE — Discharge Instructions (Addendum)
Small frequent sips of fluids today and tomorrow.  Soft food diet, try to avoid meat.  Call Dr. Coral Else office tomorrow to arrange a follow-up appointment.  Return to the emergency department if you develop any worsening symptoms.

## 2019-09-27 NOTE — ED Notes (Signed)
Pt stated that he hasn't ate or drank since last night.  Is not having any pain just discomfort in mid chest area.

## 2019-09-27 NOTE — ED Triage Notes (Signed)
C/o trouble swallowing onset yesterday, has a history of same and has had esophagus stretcehd

## 2019-09-28 ENCOUNTER — Telehealth (INDEPENDENT_AMBULATORY_CARE_PROVIDER_SITE_OTHER): Payer: Self-pay | Admitting: Internal Medicine

## 2019-09-28 NOTE — Telephone Encounter (Signed)
From ED note   Likely food impaction that spontaneously resolved with oral fluids and glucagon.  He appears appropriate for d/c.  He is a pt of Dr. Olevia Perches and agrees to arrange close f/u tomorrow.  Will continue his protonix

## 2019-09-28 NOTE — Telephone Encounter (Signed)
Patient called wanted to let Dr Laural Golden know he had to go to [facility] ED yesterday because of food impaction in his esophagus - please advise - [phone]

## 2019-09-29 ENCOUNTER — Other Ambulatory Visit (INDEPENDENT_AMBULATORY_CARE_PROVIDER_SITE_OTHER): Payer: Self-pay | Admitting: *Deleted

## 2019-09-29 NOTE — Telephone Encounter (Signed)
Patient was called and given the recommendation of Dr.Rehman. He is to eat his meals slowly and to chew the food really good before swallowing.

## 2019-12-02 ENCOUNTER — Other Ambulatory Visit: Payer: Self-pay

## 2019-12-02 ENCOUNTER — Encounter (INDEPENDENT_AMBULATORY_CARE_PROVIDER_SITE_OTHER): Payer: Self-pay | Admitting: Nurse Practitioner

## 2019-12-02 ENCOUNTER — Ambulatory Visit (INDEPENDENT_AMBULATORY_CARE_PROVIDER_SITE_OTHER): Payer: Medicare Other | Admitting: Nurse Practitioner

## 2019-12-02 VITALS — BP 130/80 | HR 74 | Temp 97.3°F | Ht 67.0 in | Wt 190.4 lb

## 2019-12-02 DIAGNOSIS — R5381 Other malaise: Secondary | ICD-10-CM | POA: Diagnosis not present

## 2019-12-02 DIAGNOSIS — E039 Hypothyroidism, unspecified: Secondary | ICD-10-CM

## 2019-12-02 DIAGNOSIS — I350 Nonrheumatic aortic (valve) stenosis: Secondary | ICD-10-CM

## 2019-12-02 DIAGNOSIS — L0292 Furuncle, unspecified: Secondary | ICD-10-CM | POA: Diagnosis not present

## 2019-12-02 DIAGNOSIS — E559 Vitamin D deficiency, unspecified: Secondary | ICD-10-CM | POA: Diagnosis not present

## 2019-12-02 DIAGNOSIS — R5383 Other fatigue: Secondary | ICD-10-CM

## 2019-12-02 LAB — T3, FREE: T3, Free: 1.8 pg/mL — ABNORMAL LOW (ref 2.3–4.2)

## 2019-12-02 LAB — T4, FREE: Free T4: 0.3 ng/dL — ABNORMAL LOW (ref 0.8–1.8)

## 2019-12-02 LAB — TSH: TSH: 97.88 mIU/L — ABNORMAL HIGH (ref 0.40–4.50)

## 2019-12-02 NOTE — Progress Notes (Signed)
Subjective:  Patient ID: Evan Miller, male    DOB: 02-12-1926  Age: 86 y.o. MRN: [id number]  CC:  Chief Complaint  Patient presents with  . Vitamin D Deficiency  . Furuncle  . Follow-up  . Other    Murmur (AS)  . Hypothyroidism      HPI  This patient comes in today for the above.  He continues on his vitamin D3 supplement.  Last serum level was checked back in March and it was in the 71s.  He also had a furuncle to his upper back and was referred to dermatology.  He tells me that he has seen dermatology, and they have cleared him to follow-up with them in 1 year.  He does have a cardiac murmur and he was found to have aortic stenosis, however he is not having any significant symptoms such as progressive fatigue, shortness of breath, exercise intolerance, or chest pain.  He also has a history of hypothyroidism he continues on his desiccated thyroid for the treatment of this, he is due for thyroid panel to be collected today.   Past Medical History:  Diagnosis Date  . Arthritis    back,shoulders and hips  . Cancer ([place])    basal cell/ Melanoma 1997 left  shoulder/  PROSTATE  . GERD (gastroesophageal reflux disease)   . Hepatitis    age 47 years  . Hypothyroidism   . PONV (postoperative nausea and vomiting)   . Prostate CA [facility])       Family History  Problem Relation Age of Onset  . Breast cancer Mother   . Prostate cancer Brother   . Nephritis Brother   . Rectal cancer Brother   . Cirrhosis Brother   . Liver cancer Brother   . Healthy Daughter   . Healthy Son     Social History   Social History Narrative  . Not on file   Social History   Tobacco Use  . Smoking status: Former Smoker    Packs/day: 1.00    Years: 30.00    Pack years: 30.00    Types: Cigarettes    Quit date: 06/16/1962    Years since quitting: 57.5  . Smokeless tobacco: Former Systems developer    Types: [place] date: 06/16/2006  Substance Use Topics  . Alcohol use: No    Comment: quit   60 yrs ago     Current Meds  Medication Sig  . aspirin 81 MG tablet Take 81 mg by mouth every morning.   . Cholecalciferol (VITAMIN D3) 5000 units CAPS Take 10,000 Units by mouth daily.  . NP THYROID 90 MG tablet TAKE 1 TABLET BY MOUTH DAILY  . pantoprazole (PROTONIX) 40 MG tablet TAKE 1 TABLET BY MOUTH  DAILY  . [DISCONTINUED] doxycycline (VIBRA-TABS) 100 MG tablet Take 1 tablet (100 mg total) by mouth 2 (two) times daily.    ROS:  Review of Systems  Constitutional: Negative for fever.  Eyes: Negative for blurred vision.  Respiratory: Negative for shortness of breath.   Cardiovascular: Negative for chest pain and palpitations.  Neurological: Negative for dizziness and headaches.     Objective:   Today's Vitals: BP 130/80 (BP Location: Left Arm, Patient Position: Sitting, Cuff Size: Normal)   Pulse 74   Temp (!) 97.3 F (36.3 C) (Temporal)   Ht 5\' 7"  (1.702 m)   Wt 190 lb 6.4 oz (86.4 kg)   SpO2 94%   BMI 29.82 kg/m  Vitals with BMI 12/02/2019 09/27/2019 09/27/2019  Height 5\' 7"  - -  Weight 190 lbs 6 oz - -  BMI 44.81 - -  Systolic 856 314 970  Diastolic 80 62 68  Pulse 74 69 58     Physical Exam Vitals reviewed.  Constitutional:      Appearance: Normal appearance.  HENT:     Head: Normocephalic and atraumatic.  Cardiovascular:     Rate and Rhythm: Normal rate and regular rhythm.     Heart sounds: Murmur heard.   Pulmonary:     Effort: Pulmonary effort is normal.     Breath sounds: Normal breath sounds.  Musculoskeletal:     Cervical back: Neck supple.  Skin:    General: Skin is warm and dry.  Neurological:     Mental Status: He is alert and oriented to person, place, and time.  Psychiatric:        Mood and Affect: Mood normal.        Behavior: Behavior normal.        Thought Content: Thought content normal.        Judgment: Judgment normal.          Assessment and Plan   1. Malaise and fatigue   2. Vitamin D deficiency disease   3. Aortic  valve stenosis, etiology of cardiac valve disease unspecified   4. Furuncle   5. Hypothyroidism, unspecified type      Plan: 1.,  5.  We will check thyroid panel today for further evaluation.  Recommendations will be made based upon those results.  2.  He will continue on his supplement.  3.  No significant symptoms of heart failure noted, we will continue to monitor.  4.  Seems to have resolved at this time, no further work-up recommended today.   Tests ordered Orders Placed This Encounter  Procedures  . TSH  . T3, Free  . T4, Free      No orders of the defined types were placed in this encounter.   Patient to follow-up in 3 months  Ailene Ards, NP

## 2019-12-03 ENCOUNTER — Other Ambulatory Visit (INDEPENDENT_AMBULATORY_CARE_PROVIDER_SITE_OTHER): Payer: Self-pay | Admitting: Nurse Practitioner

## 2019-12-03 DIAGNOSIS — E039 Hypothyroidism, unspecified: Secondary | ICD-10-CM

## 2019-12-03 MED ORDER — THYROID 120 MG PO TABS: 120.0000 mg | ORAL_TABLET | Freq: Every day | ORAL | 1 refills | Status: DC

## 2019-12-07 ENCOUNTER — Other Ambulatory Visit (HOSPITAL_COMMUNITY): Payer: Self-pay | Admitting: Internal Medicine

## 2020-01-19 ENCOUNTER — Ambulatory Visit: Payer: Self-pay | Admitting: "Endocrinology

## 2020-01-24 ENCOUNTER — Emergency Department (HOSPITAL_COMMUNITY)
Admission: EM | Admit: 2020-01-24 | Discharge: 2020-01-25 | Disposition: A | Discharge status: Home / Self Care | Payer: Medicare Other | Attending: Emergency Medicine | Admitting: Emergency Medicine

## 2020-01-24 ENCOUNTER — Encounter (HOSPITAL_COMMUNITY): Payer: Self-pay | Admitting: Emergency Medicine

## 2020-01-24 ENCOUNTER — Other Ambulatory Visit: Payer: Self-pay

## 2020-01-24 DIAGNOSIS — X58XXXA Exposure to other specified factors, initial encounter: Secondary | ICD-10-CM | POA: Insufficient documentation

## 2020-01-24 DIAGNOSIS — Y939 Activity, unspecified: Secondary | ICD-10-CM | POA: Insufficient documentation

## 2020-01-24 DIAGNOSIS — T17920A Food in respiratory tract, part unspecified causing asphyxiation, initial encounter: Secondary | ICD-10-CM | POA: Insufficient documentation

## 2020-01-24 DIAGNOSIS — Y999 Unspecified external cause status: Secondary | ICD-10-CM | POA: Insufficient documentation

## 2020-01-24 DIAGNOSIS — Y929 Unspecified place or not applicable: Secondary | ICD-10-CM | POA: Insufficient documentation

## 2020-01-24 DIAGNOSIS — Z5321 Procedure and treatment not carried out due to patient leaving prior to being seen by health care provider: Secondary | ICD-10-CM | POA: Diagnosis not present

## 2020-01-24 NOTE — ED Triage Notes (Signed)
Pt states when he eats he gets choked a lot. Pt states this is not new.

## 2020-03-16 ENCOUNTER — Other Ambulatory Visit: Payer: Self-pay

## 2020-03-16 ENCOUNTER — Encounter (INDEPENDENT_AMBULATORY_CARE_PROVIDER_SITE_OTHER): Payer: Self-pay | Admitting: Internal Medicine

## 2020-03-16 ENCOUNTER — Ambulatory Visit (INDEPENDENT_AMBULATORY_CARE_PROVIDER_SITE_OTHER): Payer: Medicare Other | Admitting: Internal Medicine

## 2020-03-16 VITALS — BP 129/67 | HR 118 | Temp 96.3°F | Resp 18 | Ht 65.0 in | Wt 188.0 lb

## 2020-03-16 DIAGNOSIS — E039 Hypothyroidism, unspecified: Secondary | ICD-10-CM | POA: Diagnosis not present

## 2020-03-16 DIAGNOSIS — K2100 Gastro-esophageal reflux disease with esophagitis, without bleeding: Secondary | ICD-10-CM

## 2020-03-16 NOTE — Progress Notes (Signed)
Metrics: Intervention Frequency ACO  Documented Smoking Status Yearly  Screened one or more times in 24 months  Cessation Counseling or  Active cessation medication Past 24 months  Past 24 months   Guideline developer: UpToDate (See UpToDate for funding source) Date Released: 2014       Wellness Office Visit  Subjective:  Patient ID: Evan Miller, male    DOB: [birth year]  Age: 86 y.o. MRN: [id number]  CC: This man comes in for follow-up of hypothyroidism and gastroesophageal reflux disease. HPI  In the past, we have noted an abdominal hernia but this is really not causing him as much problem as the gastroesophageal reflux disease is present time.  He has had esophageal stricture and dilatation approximately 2 years ago by Dr. Hildred Laser.  He continues on Protonix for his gastroesophageal reflux disease. Unfortunately, he did not take his thyroid medicine this morning.  His last blood work showed severe hypothyroidism but I am not sure that he was taking his medication at that time. Past Medical History:  Diagnosis Date  . Arthritis    back,shoulders and hips  . Cancer ([place])    basal cell/ Melanoma 1997 left  shoulder/  PROSTATE  . GERD (gastroesophageal reflux disease)   . Hepatitis    age 89 years  . Hypothyroidism   . PONV (postoperative nausea and vomiting)   . Prostate CA [facility])    Past Surgical History:  Procedure Laterality Date  . CATARACT EXTRACTION W/PHACO  12/16/2011   Procedure: CATARACT EXTRACTION PHACO AND INTRAOCULAR LENS PLACEMENT (IOC);  Surgeon: Tonny Branch, MD;  [facility]: [facility];  Service: Ophthalmology;  Laterality: Left;  CDE:17.24   . CATARACT EXTRACTION W/PHACO  12/30/2011   Procedure: CATARACT EXTRACTION PHACO AND INTRAOCULAR LENS PLACEMENT (IOC);  Surgeon: Tonny Branch, MD;  [facility]: [facility];  Service: Ophthalmology;  Laterality: Right;  CDE 15.40  . COLONOSCOPY    . COLONOSCOPY N/A 08/07/2012   Procedure: COLONOSCOPY;  Surgeon: Rogene Houston, MD;   [facility];  Service: Endoscopy;  Laterality: N/A;  10:00  . COLONOSCOPY N/A 11/22/2015   Procedure: COLONOSCOPY;  Surgeon: Rogene Houston, MD;  [facility];  Service: Endoscopy;  Laterality: N/A;  12:00 - moved to 10:30 - Ann to notify  . ESOPHAGEAL DILATION N/A 02/10/2015   Procedure: ESOPHAGEAL DILATION;  Surgeon: Rogene Houston, MD;  [facility];  Service: Endoscopy;  Laterality: N/A;  . ESOPHAGEAL DILATION N/A 03/29/2015   Procedure: ESOPHAGEAL DILATION;  Surgeon: Rogene Houston, MD;  [facility];  Service: Endoscopy;  Laterality: N/A;  . ESOPHAGEAL DILATION  11/22/2015   Procedure: ESOPHAGEAL DILATION;  Surgeon: Rogene Houston, MD;  [facility];  Service: Endoscopy;;  . ESOPHAGEAL DILATION N/A 11/19/2017   Procedure: ESOPHAGEAL DILATION;  Surgeon: Rogene Houston, MD;  [facility];  Service: Endoscopy;  Laterality: N/A;  . ESOPHAGEAL DILATION N/A 12/29/2017   Procedure: ESOPHAGEAL DILATION;  Surgeon: Rogene Houston, MD;  [facility];  Service: Endoscopy;  Laterality: N/A;  . ESOPHAGOGASTRODUODENOSCOPY N/A 02/10/2015   Procedure: ESOPHAGOGASTRODUODENOSCOPY (EGD);  Surgeon: Rogene Houston, MD;  [facility];  Service: Endoscopy;  Laterality: N/A;  925 - moved to 10:20 - Ann to notify pt  . ESOPHAGOGASTRODUODENOSCOPY N/A 03/29/2015   Procedure: ESOPHAGOGASTRODUODENOSCOPY (EGD);  Surgeon: Rogene Houston, MD;  [facility];  Service: Endoscopy;  Laterality: N/A;  145 - moved to 8:30 -  Ann notified pt  . ESOPHAGOGASTRODUODENOSCOPY N/A 11/22/2015   Procedure: ESOPHAGOGASTRODUODENOSCOPY (EGD);  Surgeon: Rogene Houston, MD;  [facility];  Service: Endoscopy;  Laterality: N/A;  . ESOPHAGOGASTRODUODENOSCOPY N/A 10/27/2017   Procedure: ESOPHAGOGASTRODUODENOSCOPY (EGD);  Surgeon: Daneil Dolin, MD;  [facility];  Service: Endoscopy;  Laterality: N/A;  removal of food  impaction  . ESOPHAGOGASTRODUODENOSCOPY N/A 11/19/2017   Procedure: ESOPHAGOGASTRODUODENOSCOPY (EGD);  Surgeon: Rogene Houston, MD;  [facility];  Service: Endoscopy;  Laterality: N/A;  7:30  . ESOPHAGOGASTRODUODENOSCOPY N/A 12/29/2017   Procedure: ESOPHAGOGASTRODUODENOSCOPY (EGD);  Surgeon: Rogene Houston, MD;  [facility];  Service: Endoscopy;  Laterality: N/A;  730  . HERNIA REPAIR    . HYDROCELE EXCISION     bilateral  . JOINT REPLACEMENT     left knee 9/12  . MELANOMA EXCISION N/A 06/11/2017   Procedure: WIDE LOCAL EXCISION WITH ADVANCEMENT FLAP CLOSURE LEFT NECK MELANOMA;  Surgeon: Stark Klein, MD;  Location: [place];  Service: General;  Laterality: N/A;  GENERAL AND LOCAL  . TOTAL KNEE ARTHROPLASTY  06/24/2011   Procedure: TOTAL KNEE ARTHROPLASTY;  Surgeon: Gearlean Alf;  [facility];  Service: Orthopedics;  Laterality: Right;     Family History  Problem Relation Age of Onset  . Breast cancer Mother   . Prostate cancer Brother   . Nephritis Brother   . Rectal cancer Brother   . Cirrhosis Brother   . Liver cancer Brother   . Healthy Daughter   . Healthy Son     Social History   Social History Narrative  . Not on file   Social History   Tobacco Use  . Smoking status: Former Smoker    Packs/day: 1.00    Years: 30.00    Pack years: 30.00    Types: Cigarettes    Quit date: 06/16/1962    Years since quitting: 57.7  . Smokeless tobacco: Former Systems developer    Types: [place] date: 06/16/2006  Substance Use Topics  . Alcohol use: No    Comment: quit  60 yrs ago    Current Meds  Medication Sig  . aspirin 81 MG tablet Take 81 mg by mouth every morning.   . Cholecalciferol (VITAMIN D3) 5000 units CAPS Take 10,000 Units by mouth daily.  . pantoprazole (PROTONIX) 40 MG tablet TAKE 1 TABLET BY MOUTH  DAILY  . thyroid (NP THYROID) 120 MG tablet Take 1 tablet (120 mg total) by mouth daily.  . [DISCONTINUED] NP THYROID 90 MG  tablet TAKE 1 TABLET BY MOUTH DAILY      Depression screen [facility] 2/9 08/12/2019  Decreased Interest 0  Down, Depressed, Hopeless 0  PHQ - 2 Score 0     Objective:   Today's Vitals: BP 129/67 (BP Location: Right Arm, Patient Position: Sitting, Cuff Size: Normal)   Pulse (!) 118   Temp (!) 96.3 F (35.7 C) (Temporal)   Resp 18   Ht 5\' 5"  (1.651 m)   Wt 188 lb (85.3 kg)   SpO2 91%   BMI 31.28 kg/m  Vitals with BMI 03/16/2020 01/24/2020 12/02/2019  Height 5\' 5"  5\' 5"  5\' 7"   Weight 188 lbs 180 lbs 190 lbs 6 oz  BMI 31.28 85.27 78.24  Systolic 235 361 443  Diastolic 67 83 80  Pulse 154 56 74     Physical Exam  He looks systemically well.  His weight is stable.  Blood pressure is excellent.     Assessment   1. Hypothyroidism, unspecified type   2. Gastroesophageal reflux disease with esophagitis without hemorrhage       Tests ordered Orders Placed This Encounter  Procedures  . Ambulatory referral to Gastroenterology     Plan: 1. I encouraged him to make sure he does take his NP thyroid 120 mg daily and when he comes to the next appointment he should have taken it in the morning. 2. I will refer him back to gastroenterology for his gastroesophageal reflux disease as I think he does need another stretch of his esophageal stricture. 3. Follow-up in about 4 weeks.  At that time we will do blood work.   No orders of the defined types were placed in this encounter.   Doree Albee, MD

## 2020-03-25 ENCOUNTER — Other Ambulatory Visit (INDEPENDENT_AMBULATORY_CARE_PROVIDER_SITE_OTHER): Payer: Self-pay | Admitting: Nurse Practitioner

## 2020-03-25 DIAGNOSIS — E039 Hypothyroidism, unspecified: Secondary | ICD-10-CM

## 2020-04-17 ENCOUNTER — Encounter (INDEPENDENT_AMBULATORY_CARE_PROVIDER_SITE_OTHER): Payer: Self-pay | Admitting: Gastroenterology

## 2020-04-17 ENCOUNTER — Ambulatory Visit (INDEPENDENT_AMBULATORY_CARE_PROVIDER_SITE_OTHER): Payer: Medicare Other | Admitting: Gastroenterology

## 2020-04-17 ENCOUNTER — Other Ambulatory Visit: Payer: Self-pay

## 2020-04-17 VITALS — BP 100/56 | HR 82 | Temp 98.1°F | Ht 65.0 in | Wt 186.2 lb

## 2020-04-17 DIAGNOSIS — R1319 Other dysphagia: Secondary | ICD-10-CM

## 2020-04-17 DIAGNOSIS — K222 Esophageal obstruction: Secondary | ICD-10-CM | POA: Diagnosis not present

## 2020-04-17 MED ORDER — OMEPRAZOLE 40 MG PO CPDR: 40.0000 mg | DELAYED_RELEASE_CAPSULE | Freq: Every day | ORAL | 3 refills | Status: DC

## 2020-04-17 NOTE — Progress Notes (Signed)
Evan Miller, M.D. Gastroenterology & Hepatology [facility] [address] [place], [place] [address]  Primary Care Physician: Doree Albee, MD [place] [id number]  I will communicate my assessment and recommendations to the referring MD via EMR. "Note: Occasional unusual wording and randomly placed punctuation marks may result from the use of speech recognition technology to transcribe this document"  Problems: 1. History of Schatzki's ring 2. Recurrent dysphagia  History of Present Illness: Evan Miller is a 86 y.o. male with past medical history of hypothyroidism, arthritis, history of Schatzki's ring status post dilation and GERD, who presents for follow up of dysphagia.  The patient was last seen on 11/06/2017. At that time, the patient was presenting recurrent dysphagia.  He had previous history of food impaction and he required dilation in the past by Dr. Tobin Chad.  He underwent an EGD on 12/29/2017 and he was dilated up to 18 mm for management of stricture at the GE junction..  The patient comes with his wife.  She reports that he has presented recurrent dysphagia since the last 2 months, mostly to solid food.  She states that liquids do not cause any problem.  States that the symptoms have worsened severely and he has had 2 episodes of food impaction for which she had to take him to the ER but fortunately in both cases the food went down on its own and he did not require to have an emergent EGD.  Has not presented any weight loss, nausea, vomiting, fever, chills, hematochezia, melena, hematemesis, abdominal distention, abdominal pain, diarrhea, jaundice, pruritus, odynophagia or weight loss.  He is compliant to the intake of pantoprazole 40 mg every day, but she states that he takes the medication sometimes after eating breakfast or at any point in the morning.  Last EGD: 12/29/2017 - presence of stenosis at 34  cm from incisors at GE junction, dilated up to 18 mmHg with balloon with mucosal disruption  Past Medical History: Past Medical History:  Diagnosis Date  . Arthritis    back,shoulders and hips  . Cancer ([place])    basal cell/ Melanoma 1997 left  shoulder/  PROSTATE  . GERD (gastroesophageal reflux disease)   . Hepatitis    age 38 years  . Hypothyroidism   . PONV (postoperative nausea and vomiting)   . Prostate CA [facility])     Past Surgical History: Past Surgical History:  Procedure Laterality Date  . CATARACT EXTRACTION W/PHACO  12/16/2011   Procedure: CATARACT EXTRACTION PHACO AND INTRAOCULAR LENS PLACEMENT (IOC);  Surgeon: Tonny Branch, MD;  [facility]: [facility];  Service: Ophthalmology;  Laterality: Left;  CDE:17.24   . CATARACT EXTRACTION W/PHACO  12/30/2011   Procedure: CATARACT EXTRACTION PHACO AND INTRAOCULAR LENS PLACEMENT (IOC);  Surgeon: Tonny Branch, MD;  [facility];  Service: Ophthalmology;  Laterality: Right;  CDE 15.40  . COLONOSCOPY    . COLONOSCOPY N/A 08/07/2012   Procedure: COLONOSCOPY;  Surgeon: Rogene Houston, MD;  [facility];  Service: Endoscopy;  Laterality: N/A;  10:00  . COLONOSCOPY N/A 11/22/2015   Procedure: COLONOSCOPY;  Surgeon: Rogene Houston, MD;  [facility];  Service: Endoscopy;  Laterality: N/A;  12:00 - moved to 10:30 - Ann to notify  . ESOPHAGEAL DILATION N/A 02/10/2015   Procedure: ESOPHAGEAL DILATION;  Surgeon: Rogene Houston, MD;  [facility];  Service: Endoscopy;  Laterality: N/A;  . ESOPHAGEAL DILATION N/A 03/29/2015  Procedure: ESOPHAGEAL DILATION;  Surgeon: Rogene Houston, MD;  [facility];  Service: Endoscopy;  Laterality: N/A;  . ESOPHAGEAL DILATION  11/22/2015   Procedure: ESOPHAGEAL DILATION;  Surgeon: Rogene Houston, MD;  [facility];  Service: Endoscopy;;  . ESOPHAGEAL DILATION N/A 11/19/2017   Procedure: ESOPHAGEAL DILATION;  Surgeon: Rogene Houston, MD;  [facility];   Service: Endoscopy;  Laterality: N/A;  . ESOPHAGEAL DILATION N/A 12/29/2017   Procedure: ESOPHAGEAL DILATION;  Surgeon: Rogene Houston, MD;  [facility];  Service: Endoscopy;  Laterality: N/A;  . ESOPHAGOGASTRODUODENOSCOPY N/A 02/10/2015   Procedure: ESOPHAGOGASTRODUODENOSCOPY (EGD);  Surgeon: Rogene Houston, MD;  [facility];  Service: Endoscopy;  Laterality: N/A;  925 - moved to 10:20 - Ann to notify pt  . ESOPHAGOGASTRODUODENOSCOPY N/A 03/29/2015   Procedure: ESOPHAGOGASTRODUODENOSCOPY (EGD);  Surgeon: Rogene Houston, MD;  [facility];  Service: Endoscopy;  Laterality: N/A;  145 - moved to 8:30 - Ann notified pt  . ESOPHAGOGASTRODUODENOSCOPY N/A 11/22/2015   Procedure: ESOPHAGOGASTRODUODENOSCOPY (EGD);  Surgeon: Rogene Houston, MD;  [facility];  Service: Endoscopy;  Laterality: N/A;  . ESOPHAGOGASTRODUODENOSCOPY N/A 10/27/2017   Procedure: ESOPHAGOGASTRODUODENOSCOPY (EGD);  Surgeon: Daneil Dolin, MD;  [facility];  Service: Endoscopy;  Laterality: N/A;  removal of food impaction  . ESOPHAGOGASTRODUODENOSCOPY N/A 11/19/2017   Procedure: ESOPHAGOGASTRODUODENOSCOPY (EGD);  Surgeon: Rogene Houston, MD;  [facility];  Service: Endoscopy;  Laterality: N/A;  7:30  . ESOPHAGOGASTRODUODENOSCOPY N/A 12/29/2017   Procedure: ESOPHAGOGASTRODUODENOSCOPY (EGD);  Surgeon: Rogene Houston, MD;  [facility];  Service: Endoscopy;  Laterality: N/A;  730  . HERNIA REPAIR    . HYDROCELE EXCISION     bilateral  . JOINT REPLACEMENT     left knee 9/12  . MELANOMA EXCISION N/A 06/11/2017   Procedure: WIDE LOCAL EXCISION WITH ADVANCEMENT FLAP CLOSURE LEFT NECK MELANOMA;  Surgeon: Stark Klein, MD;  Location: [place];  Service: General;  Laterality: N/A;  GENERAL AND LOCAL  . TOTAL KNEE ARTHROPLASTY  06/24/2011   Procedure: TOTAL KNEE ARTHROPLASTY;  Surgeon: Gearlean Alf;  [facility];  Service:  Orthopedics;  Laterality: Right;    Family History: Family History  Problem Relation Age of Onset  . Breast cancer Mother   . Prostate cancer Brother   . Nephritis Brother   . Rectal cancer Brother   . Cirrhosis Brother   . Liver cancer Brother   . Healthy Daughter   . Healthy Son     Social History: Social History   Tobacco Use  Smoking Status Former Smoker  . Packs/day: 1.00  . Years: 30.00  . Pack years: 30.00  . Types: Cigarettes  . Quit date: 06/16/1962  . Years since quitting: 64.8  Smokeless Tobacco Former Systems developer  . Types: Chew  . Quit date: 06/16/2006   Social History   Substance and Sexual Activity  Alcohol Use No   Comment: quit  60 yrs ago   Social History   Substance and Sexual Activity  Drug Use No    Allergies: Allergies  Allergen Reactions  . Ciprofloxacin Hives    Hives at IV infusion site  . Penicillins Other (See Comments)    Reaction: large knots on head Has patient had a PCN reaction causing immediate rash, facial/tongue/throat swelling, SOB or lightheadedness with hypotension: No Has patient had a PCN reaction causing severe rash  involving mucus membranes or skin necrosis: No Has patient had a PCN reaction that required hospitalization No Has patient had a PCN reaction occurring within the last 10 years: No If all of the above answers are "NO", then may proceed with Cephalosporin use.   . Sulfa Antibiotics Hives    Medications: Current Outpatient Medications  Medication Sig Dispense Refill  . aspirin 81 MG tablet Take 81 mg by mouth every morning.  30 tablet   . Cholecalciferol (VITAMIN D3) 5000 units CAPS Take 10,000 Units by mouth daily.    . NP THYROID 120 MG tablet TAKE 1 TABLET(120 MG) BY MOUTH DAILY 30 tablet 3  . omeprazole (PRILOSEC) 40 MG capsule Take 1 capsule (40 mg total) by mouth daily. 90 capsule 3   No current facility-administered medications for this visit.    Review of Systems: GENERAL: negative for malaise, night  sweats HEENT: No changes in hearing or vision, no nose bleeds or other nasal problems. NECK: Negative for lumps, goiter, pain and significant neck swelling RESPIRATORY: Negative for cough, wheezing CARDIOVASCULAR: Negative for chest pain, leg swelling, palpitations, orthopnea GI: SEE HPI MUSCULOSKELETAL: Negative for joint pain or swelling, back pain, and muscle pain. SKIN: Negative for lesions, rash PSYCH: Negative for sleep disturbance, mood disorder and recent psychosocial stressors. HEMATOLOGY Negative for prolonged bleeding, bruising easily, and swollen nodes. ENDOCRINE: Negative for cold or heat intolerance, polyuria, polydipsia and goiter. NEURO: negative for tremor, gait imbalance, syncope and seizures. The remainder of the review of systems is noncontributory.   Physical Exam: BP (!) 100/56 (BP Location: Right Arm, Patient Position: Sitting, Cuff Size: Normal)   Pulse 82   Temp 98.1 F (36.7 C) (Oral)   Ht 5\' 5"  (1.651 m)   Wt 186 lb 3.2 oz (84.5 kg)   BMI 30.99 kg/m  GENERAL: The patient is AO x3, in no acute distress. [place]. HEENT: Head is normocephalic and atraumatic. EOMI are intact. Mouth is well hydrated and without lesions. NECK: Supple. No masses LUNGS: Clear to auscultation. No presence of rhonchi/wheezing/rales. Adequate chest expansion HEART: RRR, normal s1 and s2. ABDOMEN: Soft, nontender, no guarding, no peritoneal signs, and nondistended. BS +. No masses. EXTREMITIES: Without any cyanosis, clubbing, rash, lesions or edema. NEUROLOGIC: AOx3, no focal motor deficit. SKIN: no jaundice, no rashes  Imaging/Labs: as above  I personally reviewed and interpreted the available labs, imaging and endoscopic files.  Impression and Plan: Evan Miller is a 86 y.o. male with past medical history of hypothyroidism, arthritis, history of Schatzki's ring status post dilation and GERD, who presents for follow up of dysphagia.  The patient has presented improvement of  his symptoms after previous dilations, he was found to have stricture and Schatzki's ring which are consistent with his history of GERD.  Unfortunately, the patient has not been taking the medication adequately, I emphasized this to his wife, she understood about the proper way to take the PPI.   Also, will attempt switching to a stronger potency PPI, pantoprazole will be stopped. We will schedule for an EGD with esophageal dilation of his recurrent stricture, likely with a balloon dilation.  Both patient and wife understood and agreed.   - Schedule EGD with ED - Stop pantoprazole, star omeprazole 40 mg qday - Explained presumed etiology of reflux symptoms. Instruction provided in the use of antireflux medication - patient should take medication in the morning 30-45 minutes before eating breakfast. Discussed avoidance of eating within 2 hours of lying down to sleep and  benefit of blocks to elevate head of bed.  All questions were answered.      Harvel Quale, MD Gastroenterology and Hepatology [facility]

## 2020-04-17 NOTE — Patient Instructions (Addendum)
Schedule EGD with ED Stop pantoprazole, star omeprazole 40 mg qday Explained presumed etiology of reflux symptoms. Instruction provided in the use of antireflux medication - patient should take medication in the morning 30-45 minutes before eating breakfast. Discussed avoidance of eating within 2 hours of lying down to sleep and benefit of blocks to elevate head of bed.

## 2020-04-18 ENCOUNTER — Other Ambulatory Visit (INDEPENDENT_AMBULATORY_CARE_PROVIDER_SITE_OTHER): Payer: Self-pay | Admitting: *Deleted

## 2020-04-19 ENCOUNTER — Encounter (INDEPENDENT_AMBULATORY_CARE_PROVIDER_SITE_OTHER): Payer: Self-pay | Admitting: *Deleted

## 2020-05-02 ENCOUNTER — Encounter (INDEPENDENT_AMBULATORY_CARE_PROVIDER_SITE_OTHER): Payer: Self-pay | Admitting: Internal Medicine

## 2020-05-02 ENCOUNTER — Ambulatory Visit (INDEPENDENT_AMBULATORY_CARE_PROVIDER_SITE_OTHER): Payer: Medicare Other | Admitting: Internal Medicine

## 2020-05-02 ENCOUNTER — Other Ambulatory Visit: Payer: Self-pay

## 2020-05-02 VITALS — BP 126/76 | HR 78 | Temp 97.1°F | Ht 65.0 in | Wt 188.6 lb

## 2020-05-02 DIAGNOSIS — Z23 Encounter for immunization: Secondary | ICD-10-CM

## 2020-05-02 DIAGNOSIS — E039 Hypothyroidism, unspecified: Secondary | ICD-10-CM | POA: Diagnosis not present

## 2020-05-02 DIAGNOSIS — R21 Rash and other nonspecific skin eruption: Secondary | ICD-10-CM | POA: Diagnosis not present

## 2020-05-02 MED ORDER — TRIAMCINOLONE ACETONIDE 0.1 % EX CREA: 1.0000 "application " | TOPICAL_CREAM | Freq: Two times a day (BID) | CUTANEOUS | 0 refills | Status: DC

## 2020-05-02 NOTE — Progress Notes (Signed)
Metrics: Intervention Frequency ACO  Documented Smoking Status Yearly  Screened one or more times in 24 months  Cessation Counseling or  Active cessation medication Past 24 months  Past 24 months   Guideline developer: UpToDate (See UpToDate for funding source) Date Released: 2014       Wellness Office Visit  Subjective:  Patient ID: Evan Miller, male    DOB: August 25, 1925  Age: 86 y.o. MRN: [id number]  CC: This man comes in for follow-up of hypothyroidism and also rash in the lower legs bilaterally. HPI His daughter is at the visit with him.  She is not quite convinced that he takes NP thyroid every single day.  [name] had increased the dose of NP thyroid to 120 mg daily because his thyroid function showed very elevated TSH levels and very low T3 levels. He is complaining of a rash bilaterally in the lower legs also.  Past Medical History:  Diagnosis Date  . Arthritis    back,shoulders and hips  . Cancer ([place])    basal cell/ Melanoma 1997 left  shoulder/  PROSTATE  . GERD (gastroesophageal reflux disease)   . Hepatitis    age 37 years  . Hypothyroidism   . PONV (postoperative nausea and vomiting)   . Prostate CA [facility])    Past Surgical History:  Procedure Laterality Date  . CATARACT EXTRACTION W/PHACO  12/16/2011   Procedure: CATARACT EXTRACTION PHACO AND INTRAOCULAR LENS PLACEMENT (IOC);  Surgeon: Tonny Branch, MD;  Location: [facility];  Service: Ophthalmology;  Laterality: Left;  CDE:17.24   . CATARACT EXTRACTION W/PHACO  12/30/2011   Procedure: CATARACT EXTRACTION PHACO AND INTRAOCULAR LENS PLACEMENT (IOC);  Surgeon: Tonny Branch, MD;  Location: [facility];  Service: Ophthalmology;  Laterality: Right;  CDE 15.40  . COLONOSCOPY    . COLONOSCOPY N/A 08/07/2012   Procedure: COLONOSCOPY;  Surgeon: Rogene Houston, MD;  [facility];  Service: Endoscopy;  Laterality: N/A;  10:00  . COLONOSCOPY N/A 11/22/2015   Procedure: COLONOSCOPY;  Surgeon: Rogene Houston, MD;  [facility];  Service: Endoscopy;  Laterality: N/A;  12:00 - moved to 10:30 - Ann to notify  . ESOPHAGEAL DILATION N/A 02/10/2015   Procedure: ESOPHAGEAL DILATION;  Surgeon: Rogene Houston, MD;  [facility];  Service: Endoscopy;  Laterality: N/A;  . ESOPHAGEAL DILATION N/A 03/29/2015   Procedure: ESOPHAGEAL DILATION;  Surgeon: Rogene Houston, MD;  [facility];  Service: Endoscopy;  Laterality: N/A;  . ESOPHAGEAL DILATION  11/22/2015   Procedure: ESOPHAGEAL DILATION;  Surgeon: Rogene Houston, MD;  [facility];  Service: Endoscopy;;  . ESOPHAGEAL DILATION N/A 11/19/2017   Procedure: ESOPHAGEAL DILATION;  Surgeon: Rogene Houston, MD;  [facility];  Service: Endoscopy;  Laterality: N/A;  . ESOPHAGEAL DILATION N/A 12/29/2017   Procedure: ESOPHAGEAL DILATION;  Surgeon: Rogene Houston, MD;  [facility];  Service: Endoscopy;  Laterality: N/A;  . ESOPHAGOGASTRODUODENOSCOPY N/A 02/10/2015   Procedure: ESOPHAGOGASTRODUODENOSCOPY (EGD);  Surgeon: Rogene Houston, MD;  [facility];  Service: Endoscopy;  Laterality: N/A;  925 - moved to 10:20 - Ann to notify pt  . ESOPHAGOGASTRODUODENOSCOPY N/A 03/29/2015   Procedure: ESOPHAGOGASTRODUODENOSCOPY (EGD);  Surgeon: Rogene Houston, MD;  [facility];  Service: Endoscopy;  Laterality: N/A;  145 - moved to 8:30 - Ann notified pt  . ESOPHAGOGASTRODUODENOSCOPY N/A 11/22/2015   Procedure: ESOPHAGOGASTRODUODENOSCOPY (EGD);  Surgeon: Rogene Houston, MD;  [facility];  Service: Endoscopy;  Laterality: N/A;  . ESOPHAGOGASTRODUODENOSCOPY N/A 10/27/2017   Procedure: ESOPHAGOGASTRODUODENOSCOPY (EGD);  Surgeon: Daneil Dolin, MD;  [facility];  Service: Endoscopy;  Laterality: N/A;  removal of food impaction  . ESOPHAGOGASTRODUODENOSCOPY N/A 11/19/2017   Procedure: ESOPHAGOGASTRODUODENOSCOPY (EGD);  Surgeon: Rogene Houston, MD;  [facility];  Service:  Endoscopy;  Laterality: N/A;  7:30  . ESOPHAGOGASTRODUODENOSCOPY N/A 12/29/2017   Procedure: ESOPHAGOGASTRODUODENOSCOPY (EGD);  Surgeon: Rogene Houston, MD;  [facility];  Service: Endoscopy;  Laterality: N/A;  730  . HERNIA REPAIR    . HYDROCELE EXCISION     bilateral  . JOINT REPLACEMENT     left knee 9/12  . MELANOMA EXCISION N/A 06/11/2017   Procedure: WIDE LOCAL EXCISION WITH ADVANCEMENT FLAP CLOSURE LEFT NECK MELANOMA;  Surgeon: Stark Klein, MD;  Location: [place];  Service: General;  Laterality: N/A;  GENERAL AND LOCAL  . TOTAL KNEE ARTHROPLASTY  06/24/2011   Procedure: TOTAL KNEE ARTHROPLASTY;  Surgeon: Gearlean Alf;  Location: [facility];  Service: Orthopedics;  Laterality: Right;     Family History  Problem Relation Age of Onset  . Breast cancer Mother   . Prostate cancer Brother   . Nephritis Brother   . Rectal cancer Brother   . Cirrhosis Brother   . Liver cancer Brother   . Healthy Daughter   . Healthy Son     Social History   Social History Narrative  . Not on file   Social History   Tobacco Use  . Smoking status: Former Smoker    Packs/day: 1.00    Years: 30.00    Pack years: 30.00    Types: Cigarettes    Quit date: 06/16/1962    Years since quitting: 57.9  . Smokeless tobacco: Former Systems developer    Types: [place] date: 06/16/2006  Substance Use Topics  . Alcohol use: No    Comment: quit  60 yrs ago    Current Meds  Medication Sig  . aspirin 81 MG tablet Take 81 mg by mouth every morning.   . Cholecalciferol (VITAMIN D3) 5000 units CAPS Take 10,000 Units by mouth daily.  . NP THYROID 120 MG tablet TAKE 1 TABLET(120 MG) BY MOUTH DAILY      Depression screen [facility] 2/9 05/02/2020 08/12/2019  Decreased Interest 0 0  Down, Depressed, Hopeless 0 0  PHQ - 2 Score 0 0  Altered sleeping 0 -  Tired, decreased energy 0 -  Change in appetite 0 -  Feeling bad or failure about yourself  0 -  Trouble concentrating 0 -  Moving  slowly or fidgety/restless 0 -  Suicidal thoughts 0 -  PHQ-9 Score 0 -  Difficult doing work/chores Not difficult at all -     Objective:   Today's Vitals: BP 126/76   Pulse 78   Temp (!) 97.1 F (36.2 C) (Temporal)   Ht 5\' 5"  (1.651 m)   Wt 188 lb 9.6 oz (85.5 kg)   SpO2 97%   BMI 31.38 kg/m  Vitals with BMI 05/02/2020 04/17/2020 03/16/2020  Height 5\' 5"  5\' 5"  5\' 5"   Weight 188 lbs 10 oz 186 lbs 3 oz 188 lbs  BMI 31.38 64.33 29.51  Systolic 884 166 063  Diastolic 76 56 67  Pulse 78 82 118     Physical Exam   His skin is very dry over his lower legs  and he has peripheral pitting edema.  There is no excoriated type of rash/erythematous in his lower legs.  I am wondering if this is related to hypothyroidism or not.    Assessment   1. Skin rash   2. Hypothyroidism, unspecified type       Tests ordered No orders of the defined types were placed in this encounter.    Plan: 1. I will treat the rash empirically with triamcinolone cream to see if it will help. 2. I have stressed the importance of taking NP thyroid 120 mg daily every single day. 3. Follow-up in early January to see how he is doing and we will check all the blood work then.   Meds ordered this encounter  Medications  . triamcinolone (KENALOG) 0.1 %    Sig: Apply 1 application topically 2 (two) times daily.    Dispense:  30 g    Refill:  0    Kaitland Lewellyn Luther Parody, MD

## 2020-05-02 NOTE — Addendum Note (Signed)
Addended by: Anibal Henderson on: 05/02/2020 11:42 AM   Modules accepted: Orders

## 2020-06-13 NOTE — Patient Instructions (Signed)
Evan Miller  06/13/2020     @PREFPERIOPPHARMACY @   Your procedure is scheduled on 06/16/2020.  Report to [facility] at 7:45 A.M.  Call this number if you have problems the morning of surgery:  [phone]   Remember:  Do not eat or drink after midnight.    Please follow the diet instructions given to you by the physician's office.      Take these medicines the morning of surgery with A SIP OF WATER : Prilosec and Armour    Do not wear jewelry, make-up or nail polish.  Do not wear lotions, powders, or perfumes, or deodorant.  Do not shave 48 hours prior to surgery.  Men may shave face and neck.  Do not bring valuables to the hospital.  [facility] is not responsible for any belongings or valuables.  Contacts, dentures or bridgework may not be worn into surgery.  Leave your suitcase in the car.  After surgery it may be brought to your room.  For patients admitted to the hospital, discharge time will be determined by your treatment team.  Patients discharged the day of surgery will not be allowed to drive home.   Name and phone number of your driver:   family Special instructions:  n/a  Please read over the following fact sheets that you were given. Care and Recovery After Surgery    Upper Endoscopy, Adult Upper endoscopy is a procedure to look inside the upper GI (gastrointestinal) tract. The upper GI tract is made up of:  The part of the body that moves food from your mouth to your stomach (esophagus).  The stomach.  The first part of your small intestine (duodenum). This procedure is also called esophagogastroduodenoscopy (EGD) or gastroscopy. In this procedure, your health care provider passes a thin, flexible tube (endoscope) through your mouth and down your esophagus into your stomach. A small camera is attached to the end of the tube. Images from the camera appear on a monitor in the exam room. During this procedure, your health care provider may also remove a  small piece of tissue to be sent to a lab and examined under a microscope (biopsy). Your health care provider may do an upper endoscopy to diagnose cancers of the upper GI tract. You may also have this procedure to find the cause of other conditions, such as:  Stomach pain.  Heartburn.  Pain or problems when swallowing.  Nausea and vomiting.  Stomach bleeding.  Stomach ulcers. Tell a health care provider about:  Any allergies you have.  All medicines you are taking, including vitamins, herbs, eye drops, creams, and over-the-counter medicines.  Any problems you or family members have had with anesthetic medicines.  Any blood disorders you have.  Any surgeries you have had.  Any medical conditions you have.  Whether you are pregnant or may be pregnant. What are the risks? Generally, this is a safe procedure. However, problems may occur, including:  Infection.  Bleeding.  Allergic reactions to medicines.  A tear or hole (perforation) in the esophagus, stomach, or duodenum. What happens before the procedure? Staying hydrated Follow instructions from your health care provider about hydration, which may include:  Up to 2 hours before the procedure - you may continue to drink clear liquids, such as water, clear fruit juice, black coffee, and plain tea.  Eating and drinking restrictions Follow instructions from your health care provider about eating and drinking, which may include:  8 hours before the procedure -  stop eating heavy meals or foods, such as meat, fried foods, or fatty foods.  6 hours before the procedure - stop eating light meals or foods, such as toast or cereal.  6 hours before the procedure - stop drinking milk or drinks that contain milk.  2 hours before the procedure - stop drinking clear liquids. Medicines Ask your health care provider about:  Changing or stopping your regular medicines. This is especially important if you are taking diabetes  medicines or blood thinners.  Taking medicines such as aspirin and ibuprofen. These medicines can thin your blood. Do not take these medicines unless your health care provider tells you to take them.  Taking over-the-counter medicines, vitamins, herbs, and supplements. General instructions  Plan to have someone take you home from the hospital or clinic.  If you will be going home right after the procedure, plan to have someone with you for 24 hours.  Ask your health care provider what steps will be taken to help prevent infection. What happens during the procedure?   An IV will be inserted into one of your veins.  You may be given one or more of the following: ? A medicine to help you relax (sedative). ? A medicine to numb the throat (local anesthetic).  You will lie on your left side on an exam table.  Your health care provider will pass the endoscope through your mouth and down your esophagus.  Your health care provider will use the scope to check the inside of your esophagus, stomach, and duodenum. Biopsies may be taken.  The endoscope will be removed. The procedure may vary among health care providers and hospitals. What happens after the procedure?  Your blood pressure, heart rate, breathing rate, and blood oxygen level will be monitored until you leave the hospital or clinic.  Do not drive for 24 hours if you were given a sedative during your procedure.  When your throat is no longer numb, you may be given some fluids to drink.  It is up to you to get the results of your procedure. Ask your health care provider, or the department that is doing the procedure, when your results will be ready. Summary  Upper endoscopy is a procedure to look inside the upper GI tract.  During the procedure, an IV will be inserted into one of your veins. You may be given a medicine to help you relax.  A medicine will be used to numb your throat.  The endoscope will be passed through  your mouth and down your esophagus. This information is not intended to replace advice given to you by your health care provider. Make sure you discuss any questions you have with your health care provider. Document Revised: 11/19/2017 Document Reviewed: 10/27/2017 Elsevier Patient Education  2020 [employer]    Monitored Anesthesia Care Anesthesia is a term that refers to techniques, procedures, and medicines that help a person stay safe and comfortable during a medical procedure. Monitored anesthesia care, or sedation, is one type of anesthesia. Your anesthesia specialist may recommend sedation if you will be having a procedure that does not require you to be unconscious, such as:  Cataract surgery.  A dental procedure.  A biopsy.  A colonoscopy. During the procedure, you may receive a medicine to help you relax (sedative). There are three levels of sedation:  Mild sedation. At this level, you may feel awake and relaxed. You will be able to follow directions.  Moderate sedation. At this level, you  will be sleepy. You may not remember the procedure.  Deep sedation. At this level, you will be asleep. You will not remember the procedure. The more medicine you are given, the deeper your level of sedation will be. Depending on how you respond to the procedure, the anesthesia specialist may change your level of sedation or the type of anesthesia to fit your needs. An anesthesia specialist will monitor you closely during the procedure. Let your health care provider know about:  Any allergies you have.  All medicines you are taking, including vitamins, herbs, eye drops, creams, and over-the-counter medicines.  Any use of steroids (by mouth or as a cream).  Any problems you or family members have had with sedatives and anesthetic medicines.  Any blood disorders you have.  Any surgeries you have had.  Any medical conditions you have, such as sleep apnea.  Whether you are pregnant  or may be pregnant.  Any use of cigarettes, alcohol, or street drugs. What are the risks? Generally, this is a safe procedure. However, problems may occur, including:  Getting too much medicine (oversedation).  Nausea.  Allergic reaction to medicines.  Trouble breathing. If this happens, a breathing tube may be used to help with breathing. It will be removed when you are awake and breathing on your own.  Heart trouble.  Lung trouble. Before the procedure Staying hydrated Follow instructions from your health care provider about hydration, which may include:  Up to 2 hours before the procedure - you may continue to drink clear liquids, such as water, clear fruit juice, black coffee, and plain tea. Eating and drinking restrictions Follow instructions from your health care provider about eating and drinking, which may include:  8 hours before the procedure - stop eating heavy meals or foods such as meat, fried foods, or fatty foods.  6 hours before the procedure - stop eating light meals or foods, such as toast or cereal.  6 hours before the procedure - stop drinking milk or drinks that contain milk.  2 hours before the procedure - stop drinking clear liquids. Medicines Ask your health care provider about:  Changing or stopping your regular medicines. This is especially important if you are taking diabetes medicines or blood thinners.  Taking medicines such as aspirin and ibuprofen. These medicines can thin your blood. Do not take these medicines before your procedure if your health care provider instructs you not to. Tests and exams  You will have a physical exam.  You may have blood tests done to show: ? How well your kidneys and liver are working. ? How well your blood can clot. General instructions  Plan to have someone take you home from the hospital or clinic.  If you will be going home right after the procedure, plan to have someone with you for 24 hours.  What  happens during the procedure?  Your blood pressure, heart rate, breathing, level of pain and overall condition will be monitored.  An IV tube will be inserted into one of your veins.  Your anesthesia specialist will give you medicines as needed to keep you comfortable during the procedure. This may mean changing the level of sedation.  The procedure will be performed. After the procedure  Your blood pressure, heart rate, breathing rate, and blood oxygen level will be monitored until the medicines you were given have worn off.  Do not drive for 24 hours if you received a sedative.  You may: ? Feel sleepy, clumsy, or nauseous. ?  Feel forgetful about what happened after the procedure. ? Have a sore throat if you had a breathing tube during the procedure. ? Vomit. This information is not intended to replace advice given to you by your health care provider. Make sure you discuss any questions you have with your health care provider. Document Revised: 05/09/2017 Document Reviewed: 09/17/2015 Elsevier Patient Education  [place].

## 2020-06-14 ENCOUNTER — Other Ambulatory Visit (HOSPITAL_COMMUNITY): Admission: RE | Admit: 2020-06-14 | Payer: Medicare Other | Source: Ambulatory Visit

## 2020-06-14 ENCOUNTER — Encounter (HOSPITAL_COMMUNITY)
Admission: RE | Admit: 2020-06-14 | Discharge: 2020-06-14 | Disposition: A | Discharge status: Home / Self Care | Payer: Medicare Other | Source: Ambulatory Visit | Attending: Gastroenterology | Admitting: Gastroenterology

## 2020-06-15 ENCOUNTER — Other Ambulatory Visit: Payer: Self-pay

## 2020-06-15 ENCOUNTER — Telehealth (INDEPENDENT_AMBULATORY_CARE_PROVIDER_SITE_OTHER): Payer: Medicare Other | Admitting: Internal Medicine

## 2020-06-15 ENCOUNTER — Encounter (INDEPENDENT_AMBULATORY_CARE_PROVIDER_SITE_OTHER): Payer: Self-pay | Admitting: Internal Medicine

## 2020-06-15 VITALS — Ht 65.0 in

## 2020-06-15 DIAGNOSIS — J40 Bronchitis, not specified as acute or chronic: Secondary | ICD-10-CM | POA: Diagnosis not present

## 2020-06-15 MED ORDER — AZITHROMYCIN 250 MG PO TABS: ORAL_TABLET | ORAL | 0 refills | Status: DC

## 2020-06-15 NOTE — Progress Notes (Signed)
Metrics: Intervention Frequency ACO  Documented Smoking Status Yearly  Screened one or more times in 24 months  Cessation Counseling or  Active cessation medication Past 24 months  Past 24 months   Guideline developer: UpToDate (See UpToDate for funding source) Date Released: 2014       Wellness Office Visit  Subjective:  Patient ID: Evan Miller, male    DOB: [birth year]  Age: 87 y.o. MRN: [id number]  CC: This is an audio telemedicine with the permission of the patient was at home and I am in my office.  I was able to use 2 identifiers to identify the patient. Productive cough. HPI  The patient has had symptoms for the last couple of days but denies any increased dyspnea.  He denies a fever.  His daughter, who is a Designer, jewellery, yesterday today rapid COVID test at home which was negative.  However, I was told that the oxygen saturation on room air yesterday was 91%. Past Medical History:  Diagnosis Date  . Arthritis    back,shoulders and hips  . Cancer ([place])    basal cell/ Melanoma 1997 left  shoulder/  PROSTATE  . GERD (gastroesophageal reflux disease)   . Hepatitis    age 54 years  . Hypothyroidism   . PONV (postoperative nausea and vomiting)   . Prostate CA [facility])    Past Surgical History:  Procedure Laterality Date  . CATARACT EXTRACTION W/PHACO  12/16/2011   Procedure: CATARACT EXTRACTION PHACO AND INTRAOCULAR LENS PLACEMENT (IOC);  Surgeon: Tonny Branch, MD;  [facility];  Service: Ophthalmology;  Laterality: Left;  CDE:17.24   . CATARACT EXTRACTION W/PHACO  12/30/2011   Procedure: CATARACT EXTRACTION PHACO AND INTRAOCULAR LENS PLACEMENT (IOC);  Surgeon: Tonny Branch, MD;  [facility];  Service: Ophthalmology;  Laterality: Right;  CDE 15.40  . COLONOSCOPY    . COLONOSCOPY N/A 08/07/2012   Procedure: COLONOSCOPY;  Surgeon: Rogene Houston, MD;  [facility];  Service: Endoscopy;  Laterality: N/A;  10:00  . COLONOSCOPY N/A 11/22/2015   Procedure:  COLONOSCOPY;  Surgeon: Rogene Houston, MD;  [facility];  Service: Endoscopy;  Laterality: N/A;  12:00 - moved to 10:30 - Ann to notify  . ESOPHAGEAL DILATION N/A 02/10/2015   Procedure: ESOPHAGEAL DILATION;  Surgeon: Rogene Houston, MD;  [facility];  Service: Endoscopy;  Laterality: N/A;  . ESOPHAGEAL DILATION N/A 03/29/2015   Procedure: ESOPHAGEAL DILATION;  Surgeon: Rogene Houston, MD;  [facility];  Service: Endoscopy;  Laterality: N/A;  . ESOPHAGEAL DILATION  11/22/2015   Procedure: ESOPHAGEAL DILATION;  Surgeon: Rogene Houston, MD;  [facility];  Service: Endoscopy;;  . ESOPHAGEAL DILATION N/A 11/19/2017   Procedure: ESOPHAGEAL DILATION;  Surgeon: Rogene Houston, MD;  [facility];  Service: Endoscopy;  Laterality: N/A;  . ESOPHAGEAL DILATION N/A 12/29/2017   Procedure: ESOPHAGEAL DILATION;  Surgeon: Rogene Houston, MD;  [facility];  Service: Endoscopy;  Laterality: N/A;  . ESOPHAGOGASTRODUODENOSCOPY N/A 02/10/2015   Procedure: ESOPHAGOGASTRODUODENOSCOPY (EGD);  Surgeon: Rogene Houston, MD;  [facility];  Service: Endoscopy;  Laterality: N/A;  925 - moved to 10:20 - Ann to notify pt  . ESOPHAGOGASTRODUODENOSCOPY N/A 03/29/2015   Procedure: ESOPHAGOGASTRODUODENOSCOPY (EGD);  Surgeon: Rogene Houston, MD;  [facility];  Service: Endoscopy;  Laterality: N/A;  145 - moved to 8:30 - Ann notified pt  . ESOPHAGOGASTRODUODENOSCOPY N/A 11/22/2015  Procedure: ESOPHAGOGASTRODUODENOSCOPY (EGD);  Surgeon: Rogene Houston, MD;  [facility];  Service: Endoscopy;  Laterality: N/A;  . ESOPHAGOGASTRODUODENOSCOPY N/A 10/27/2017   Procedure: ESOPHAGOGASTRODUODENOSCOPY (EGD);  Surgeon: Daneil Dolin, MD;  [facility];  Service: Endoscopy;  Laterality: N/A;  removal of food impaction  . ESOPHAGOGASTRODUODENOSCOPY N/A 11/19/2017   Procedure: ESOPHAGOGASTRODUODENOSCOPY (EGD);  Surgeon: Rogene Houston, MD;  [facility];  Service: Endoscopy;  Laterality: N/A;  7:30  . ESOPHAGOGASTRODUODENOSCOPY N/A 12/29/2017   Procedure: ESOPHAGOGASTRODUODENOSCOPY (EGD);  Surgeon: Rogene Houston, MD;  [facility];  Service: Endoscopy;  Laterality: N/A;  730  . HERNIA REPAIR    . HYDROCELE EXCISION     bilateral  . JOINT REPLACEMENT     left knee 9/12  . MELANOMA EXCISION N/A 06/11/2017   Procedure: WIDE LOCAL EXCISION WITH ADVANCEMENT FLAP CLOSURE LEFT NECK MELANOMA;  Surgeon: Stark Klein, MD;  Location: [place];  Service: General;  Laterality: N/A;  GENERAL AND LOCAL  . TOTAL KNEE ARTHROPLASTY  06/24/2011   Procedure: TOTAL KNEE ARTHROPLASTY;  Surgeon: Gearlean Alf;  [facility];  Service: Orthopedics;  Laterality: Right;     Family History  Problem Relation Age of Onset  . Breast cancer Mother   . Prostate cancer Brother   . Nephritis Brother   . Rectal cancer Brother   . Cirrhosis Brother   . Liver cancer Brother   . Healthy Daughter   . Healthy Son     Social History   Social History Narrative  . Not on file   Social History   Tobacco Use  . Smoking status: Former Smoker    Packs/day: 1.00    Years: 30.00    Pack years: 30.00    Types: Cigarettes    Quit date: 06/16/1962    Years since quitting: 58.0  . Smokeless tobacco: Former Systems developer    Types: [place] date: 06/16/2006  Substance Use Topics  . Alcohol use: No    Comment: quit  60 yrs ago    Current Meds  Medication Sig  . aspirin 81 MG tablet Take 81 mg by mouth every morning.   Marland Kitchen azithromycin (ZITHROMAX) 250 MG tablet Take 2 tablets the first day and then 1 tablet every day for the next 4 days  . Cholecalciferol (VITAMIN D) 50 MCG (2000 UT) tablet Take 2,000 Units by mouth daily.  . NP THYROID 120 MG tablet TAKE 1 TABLET(120 MG) BY MOUTH DAILY (Patient taking differently: Take 120 mg by mouth daily.)  . omeprazole (PRILOSEC) 40 MG capsule Take 40 mg by mouth  daily.  Marland Kitchen thyroid (ARMOUR) 90 MG tablet Take 90 mg by mouth daily.  Marland Kitchen triamcinolone (KENALOG) 0.1 % Apply 1 application topically 2 (two) times daily.      Depression screen [facility] 2/9 05/02/2020 08/12/2019  Decreased Interest 0 0  Down, Depressed, Hopeless 0 0  PHQ - 2 Score 0 0  Altered sleeping 0 -  Tired, decreased energy 0 -  Change in appetite 0 -  Feeling bad or failure about yourself  0 -  Trouble concentrating 0 -  Moving slowly or fidgety/restless 0 -  Suicidal thoughts 0 -  PHQ-9 Score 0 -  Difficult doing work/chores Not difficult at all -     Objective:   Today's Vitals: Ht 5\' 5"  (1.651 m)   BMI 31.38 kg/m  Vitals with BMI 06/15/2020 05/02/2020 04/17/2020  Height  5\' 5"  5\' 5"  5\' 5"   Weight (No Data) 188 lbs 10 oz 186 lbs 3 oz  BMI - 31.38 30.99  Systolic (No Data) 126 100  Diastolic (No Data) 76 56  Pulse (No Data) 78 82     Physical Exam   Virtual visit.  He appears to be alert and orientated and not dyspneic at rest.    Assessment   1. Bronchitis       Tests ordered No orders of the defined types were placed in this encounter.    Plan: 1. I will treat him empirically with Zithromax for bronchitis.  I will reach out to the daughter, , to repeat oxygen saturation levels and if it is dropping, he may need to go to the emergency room as he may be developing a worsening pneumonia.  Also, I have told the patient that he may need to repeat COVID test in the next 48 hours as the first 1 may have turned out to be negative. 2. This phone call lasted 5 minutes.   Meds ordered this encounter  Medications  . azithromycin (ZITHROMAX) 250 MG tablet    Sig: Take 2 tablets the first day and then 1 tablet every day for the next 4 days    Dispense:  6 tablet    Refill:  0    Francie Keeling , MD

## 2020-06-16 ENCOUNTER — Encounter (HOSPITAL_COMMUNITY): Admission: RE | Payer: Self-pay | Source: Home / Self Care

## 2020-06-16 ENCOUNTER — Ambulatory Visit (HOSPITAL_COMMUNITY): Admission: RE | Admit: 2020-06-16 | Payer: Medicare Other | Source: Home / Self Care | Admitting: Gastroenterology

## 2020-06-16 SURGERY — ESOPHAGOGASTRODUODENOSCOPY (EGD) WITH PROPOFOL
Anesthesia: Monitor Anesthesia Care

## 2020-06-20 ENCOUNTER — Encounter (INDEPENDENT_AMBULATORY_CARE_PROVIDER_SITE_OTHER): Payer: Self-pay | Admitting: Internal Medicine

## 2020-06-20 ENCOUNTER — Ambulatory Visit (INDEPENDENT_AMBULATORY_CARE_PROVIDER_SITE_OTHER): Payer: Medicare Other | Admitting: Internal Medicine

## 2020-06-20 ENCOUNTER — Other Ambulatory Visit: Payer: Self-pay

## 2020-06-20 VITALS — BP 128/80 | HR 69 | Temp 97.1°F | Ht 65.0 in | Wt 182.0 lb

## 2020-06-20 DIAGNOSIS — J40 Bronchitis, not specified as acute or chronic: Secondary | ICD-10-CM | POA: Diagnosis not present

## 2020-06-20 DIAGNOSIS — E039 Hypothyroidism, unspecified: Secondary | ICD-10-CM | POA: Diagnosis not present

## 2020-06-20 LAB — T3, FREE: T3, Free: 4.4 pg/mL — ABNORMAL HIGH (ref 2.3–4.2)

## 2020-06-20 LAB — TSH: TSH: 0.74 mIU/L (ref 0.40–4.50)

## 2020-06-20 LAB — T4, FREE: Free T4: 1.1 ng/dL (ref 0.8–1.8)

## 2020-06-20 NOTE — Progress Notes (Signed)
Metrics: Intervention Frequency ACO  Documented Smoking Status Yearly  Screened one or more times in 24 months  Cessation Counseling or  Active cessation medication Past 24 months  Past 24 months   Guideline developer: UpToDate (See UpToDate for funding source) Date Released: 2014       Wellness Office Visit  Subjective:  Patient ID: Evan Miller, male    DOB: Mar 19, 1926  Age: 87 y.o. MRN: [id number]  CC:  HPI   Past Medical History:  Diagnosis Date  . Arthritis    back,shoulders and hips  . Cancer ([place])    basal cell/ Melanoma 1997 left  shoulder/  PROSTATE  . GERD (gastroesophageal reflux disease)   . Hepatitis    age 41 years  . Hypothyroidism   . PONV (postoperative nausea and vomiting)   . Prostate CA [facility])    Past Surgical History:  Procedure Laterality Date  . CATARACT EXTRACTION W/PHACO  12/16/2011   Procedure: CATARACT EXTRACTION PHACO AND INTRAOCULAR LENS PLACEMENT (IOC);  Surgeon: Tonny Branch, MD;  [facility];  Service: Ophthalmology;  Laterality: Left;  CDE:17.24   . CATARACT EXTRACTION W/PHACO  12/30/2011   Procedure: CATARACT EXTRACTION PHACO AND INTRAOCULAR LENS PLACEMENT (IOC);  Surgeon: Tonny Branch, MD;  [facility];  Service: Ophthalmology;  Laterality: Right;  CDE 15.40  . COLONOSCOPY    . COLONOSCOPY N/A 08/07/2012   Procedure: COLONOSCOPY;  Surgeon: Rogene Houston, MD;  [facility];  Service: Endoscopy;  Laterality: N/A;  10:00  . COLONOSCOPY N/A 11/22/2015   Procedure: COLONOSCOPY;  Surgeon: Rogene Houston, MD;  [facility];  Service: Endoscopy;  Laterality: N/A;  12:00 - moved to 10:30 - Ann to notify  . ESOPHAGEAL DILATION N/A 02/10/2015   Procedure: ESOPHAGEAL DILATION;  Surgeon: Rogene Houston, MD;  [facility];  Service: Endoscopy;  Laterality: N/A;  . ESOPHAGEAL DILATION N/A 03/29/2015   Procedure: ESOPHAGEAL DILATION;  Surgeon: Rogene Houston, MD;  [facility];  Service: Endoscopy;   Laterality: N/A;  . ESOPHAGEAL DILATION  11/22/2015   Procedure: ESOPHAGEAL DILATION;  Surgeon: Rogene Houston, MD;  [facility];  Service: Endoscopy;;  . ESOPHAGEAL DILATION N/A 11/19/2017   Procedure: ESOPHAGEAL DILATION;  Surgeon: Rogene Houston, MD;  [facility];  Service: Endoscopy;  Laterality: N/A;  . ESOPHAGEAL DILATION N/A 12/29/2017   Procedure: ESOPHAGEAL DILATION;  Surgeon: Rogene Houston, MD;  [facility];  Service: Endoscopy;  Laterality: N/A;  . ESOPHAGOGASTRODUODENOSCOPY N/A 02/10/2015   Procedure: ESOPHAGOGASTRODUODENOSCOPY (EGD);  Surgeon: Rogene Houston, MD;  [facility];  Service: Endoscopy;  Laterality: N/A;  925 - moved to 10:20 - Ann to notify pt  . ESOPHAGOGASTRODUODENOSCOPY N/A 03/29/2015   Procedure: ESOPHAGOGASTRODUODENOSCOPY (EGD);  Surgeon: Rogene Houston, MD;  [facility];  Service: Endoscopy;  Laterality: N/A;  145 - moved to 8:30 - Ann notified pt  . ESOPHAGOGASTRODUODENOSCOPY N/A 11/22/2015   Procedure: ESOPHAGOGASTRODUODENOSCOPY (EGD);  Surgeon: Rogene Houston, MD;  [facility];  Service: Endoscopy;  Laterality: N/A;  . ESOPHAGOGASTRODUODENOSCOPY N/A 10/27/2017   Procedure: ESOPHAGOGASTRODUODENOSCOPY (EGD);  Surgeon: Daneil Dolin, MD;  [facility];  Service: Endoscopy;  Laterality: N/A;  removal of food impaction  . ESOPHAGOGASTRODUODENOSCOPY N/A 11/19/2017   Procedure: ESOPHAGOGASTRODUODENOSCOPY (EGD);  Surgeon: Rogene Houston, MD;  [facility];  Service: Endoscopy;  Laterality: N/A;  7:30  . ESOPHAGOGASTRODUODENOSCOPY  N/A 12/29/2017   Procedure: ESOPHAGOGASTRODUODENOSCOPY (EGD);  Surgeon: Rogene Houston, MD;  [facility];  Service: Endoscopy;  Laterality: N/A;  730  . HERNIA REPAIR    . HYDROCELE EXCISION     bilateral  . JOINT REPLACEMENT     left knee 9/12  . MELANOMA EXCISION N/A 06/11/2017   Procedure: WIDE LOCAL EXCISION WITH ADVANCEMENT  FLAP CLOSURE LEFT NECK MELANOMA;  Surgeon: Stark Klein, MD;  Location: [place];  Service: General;  Laterality: N/A;  GENERAL AND LOCAL  . TOTAL KNEE ARTHROPLASTY  06/24/2011   Procedure: TOTAL KNEE ARTHROPLASTY;  Surgeon: Gearlean Alf;  [facility];  Service: Orthopedics;  Laterality: Right;     Family History  Problem Relation Age of Onset  . Breast cancer Mother   . Prostate cancer Brother   . Nephritis Brother   . Rectal cancer Brother   . Cirrhosis Brother   . Liver cancer Brother   . Healthy Daughter   . Healthy Son     Social History   Social History Narrative  . Not on file   Social History   Tobacco Use  . Smoking status: Former Smoker    Packs/day: 1.00    Years: 30.00    Pack years: 30.00    Types: Cigarettes    Quit date: 06/16/1962    Years since quitting: 58.0  . Smokeless tobacco: Former Systems developer    Types: [place] date: 06/16/2006  Substance Use Topics  . Alcohol use: No    Comment: quit  60 yrs ago    Current Meds  Medication Sig  . aspirin 81 MG tablet Take 81 mg by mouth every morning.   . Cholecalciferol (VITAMIN D) 50 MCG (2000 UT) tablet Take 2,000 Units by mouth daily.  . NP THYROID 120 MG tablet TAKE 1 TABLET(120 MG) BY MOUTH DAILY (Patient taking differently: Take 120 mg by mouth daily.)  . omeprazole (PRILOSEC) 40 MG capsule Take 40 mg by mouth daily.  Marland Kitchen thyroid (ARMOUR) 90 MG tablet Take 90 mg by mouth daily.  Marland Kitchen triamcinolone (KENALOG) 0.1 % Apply 1 application topically 2 (two) times daily.      Depression screen [facility] 2/9 05/02/2020 08/12/2019  Decreased Interest 0 0  Down, Depressed, Hopeless 0 0  PHQ - 2 Score 0 0  Altered sleeping 0 -  Tired, decreased energy 0 -  Change in appetite 0 -  Feeling bad or failure about yourself  0 -  Trouble concentrating 0 -  Moving slowly or fidgety/restless 0 -  Suicidal thoughts 0 -  PHQ-9 Score 0 -  Difficult doing work/chores Not difficult at all -      Objective:   Today's Vitals: BP 128/80   Pulse 69   Temp (!) 97.1 F (36.2 C) (Temporal)   Ht 5\' 5"  (1.651 m)   Wt 182 lb (82.6 kg)   SpO2 97%   BMI 30.29 kg/m  Vitals with BMI 06/20/2020 06/15/2020 05/02/2020  Height 5\' 5"  5\' 5"  5\' 5"   Weight 182 lbs (No Data) 188 lbs 10 oz  BMI 09.38 - 18.29  Systolic 937 (No Data) 169  Diastolic 80 (No Data) 76  Pulse 69 (No Data) 78     Physical Exam       Assessment   1. Hypothyroidism, unspecified type   2. Bronchitis       Tests ordered Orders Placed This Encounter  Procedures  . T3, free  .  TSH  . T4, free     Plan: 1.    No orders of the defined types were placed in this encounter.   Doree Albee, MD

## 2020-06-20 NOTE — Progress Notes (Signed)
Metrics: Intervention Frequency ACO  Documented Smoking Status Yearly  Screened one or more times in 24 months  Cessation Counseling or  Active cessation medication Past 24 months  Past 24 months   Guideline developer: UpToDate (See UpToDate for funding source) Date Released: 2014       Wellness Office Visit  Subjective:  Patient ID: Evan Miller, male    DOB: 06/21/1925  Age: 87 y.o. MRN: [id number]  CC: This man comes in for follow-up of hypothyroidism and recent bronchitis. HPI  Thankfully his bronchitis seems to be improving.  Unfortunately, he tells me he is not sure if he is taking the NP thyroid on a daily basis.  His son, who is present with him, confirms that he really is not taking his medicine as prescribed.  His thyroid function test were terrible on the last visit.  The son is concerned that he is having memory issues. Past Medical History:  Diagnosis Date  . Arthritis    back,shoulders and hips  . Cancer ([place])    basal cell/ Melanoma 1997 left  shoulder/  PROSTATE  . GERD (gastroesophageal reflux disease)   . Hepatitis    age 44 years  . Hypothyroidism   . PONV (postoperative nausea and vomiting)   . Prostate CA [facility])    Past Surgical History:  Procedure Laterality Date  . CATARACT EXTRACTION W/PHACO  12/16/2011   Procedure: CATARACT EXTRACTION PHACO AND INTRAOCULAR LENS PLACEMENT (IOC);  Surgeon: Tonny Branch, MD;  [facility];  Service: Ophthalmology;  Laterality: Left;  CDE:17.24   . CATARACT EXTRACTION W/PHACO  12/30/2011   Procedure: CATARACT EXTRACTION PHACO AND INTRAOCULAR LENS PLACEMENT (IOC);  Surgeon: Tonny Branch, MD;  [facility];  Service: Ophthalmology;  Laterality: Right;  CDE 15.40  . COLONOSCOPY    . COLONOSCOPY N/A 08/07/2012   Procedure: COLONOSCOPY;  Surgeon: Rogene Houston, MD;  [facility];  Service: Endoscopy;  Laterality: N/A;  10:00  . COLONOSCOPY N/A 11/22/2015   Procedure: COLONOSCOPY;  Surgeon: Rogene Houston, MD;   [facility];  Service: Endoscopy;  Laterality: N/A;  12:00 - moved to 10:30 - Ann to notify  . ESOPHAGEAL DILATION N/A 02/10/2015   Procedure: ESOPHAGEAL DILATION;  Surgeon: Rogene Houston, MD;  [facility];  Service: Endoscopy;  Laterality: N/A;  . ESOPHAGEAL DILATION N/A 03/29/2015   Procedure: ESOPHAGEAL DILATION;  Surgeon: Rogene Houston, MD;  [facility];  Service: Endoscopy;  Laterality: N/A;  . ESOPHAGEAL DILATION  11/22/2015   Procedure: ESOPHAGEAL DILATION;  Surgeon: Rogene Houston, MD;  [facility];  Service: Endoscopy;;  . ESOPHAGEAL DILATION N/A 11/19/2017   Procedure: ESOPHAGEAL DILATION;  Surgeon: Rogene Houston, MD;  [facility];  Service: Endoscopy;  Laterality: N/A;  . ESOPHAGEAL DILATION N/A 12/29/2017   Procedure: ESOPHAGEAL DILATION;  Surgeon: Rogene Houston, MD;  [facility];  Service: Endoscopy;  Laterality: N/A;  . ESOPHAGOGASTRODUODENOSCOPY N/A 02/10/2015   Procedure: ESOPHAGOGASTRODUODENOSCOPY (EGD);  Surgeon: Rogene Houston, MD;  [facility];  Service: Endoscopy;  Laterality: N/A;  925 - moved to 10:20 - Ann to notify pt  . ESOPHAGOGASTRODUODENOSCOPY N/A 03/29/2015   Procedure: ESOPHAGOGASTRODUODENOSCOPY (EGD);  Surgeon: Rogene Houston, MD;  [facility];  Service: Endoscopy;  Laterality: N/A;  145 - moved to 8:30 - Ann notified pt  . ESOPHAGOGASTRODUODENOSCOPY N/A 11/22/2015   Procedure: ESOPHAGOGASTRODUODENOSCOPY (EGD);  Surgeon: Mechele Dawley  Laural Golden, MD;  [facility];  Service: Endoscopy;  Laterality: N/A;  . ESOPHAGOGASTRODUODENOSCOPY N/A 10/27/2017   Procedure: ESOPHAGOGASTRODUODENOSCOPY (EGD);  Surgeon: Daneil Dolin, MD;  [facility];  Service: Endoscopy;  Laterality: N/A;  removal of food impaction  . ESOPHAGOGASTRODUODENOSCOPY N/A 11/19/2017   Procedure: ESOPHAGOGASTRODUODENOSCOPY (EGD);  Surgeon: Rogene Houston, MD;  [facility];   Service: Endoscopy;  Laterality: N/A;  7:30  . ESOPHAGOGASTRODUODENOSCOPY N/A 12/29/2017   Procedure: ESOPHAGOGASTRODUODENOSCOPY (EGD);  Surgeon: Rogene Houston, MD;  [facility];  Service: Endoscopy;  Laterality: N/A;  730  . HERNIA REPAIR    . HYDROCELE EXCISION     bilateral  . JOINT REPLACEMENT     left knee 9/12  . MELANOMA EXCISION N/A 06/11/2017   Procedure: WIDE LOCAL EXCISION WITH ADVANCEMENT FLAP CLOSURE LEFT NECK MELANOMA;  Surgeon: Stark Klein, MD;  Location: [place];  Service: General;  Laterality: N/A;  GENERAL AND LOCAL  . TOTAL KNEE ARTHROPLASTY  06/24/2011   Procedure: TOTAL KNEE ARTHROPLASTY;  Surgeon: Gearlean Alf;  [facility];  Service: Orthopedics;  Laterality: Right;     Family History  Problem Relation Age of Onset  . Breast cancer Mother   . Prostate cancer Brother   . Nephritis Brother   . Rectal cancer Brother   . Cirrhosis Brother   . Liver cancer Brother   . Healthy Daughter   . Healthy Son     Social History   Social History Narrative  . Not on file   Social History   Tobacco Use  . Smoking status: Former Smoker    Packs/day: 1.00    Years: 30.00    Pack years: 30.00    Types: Cigarettes    Quit date: 06/16/1962    Years since quitting: 58.0  . Smokeless tobacco: Former Systems developer    Types: [place] date: 06/16/2006  Substance Use Topics  . Alcohol use: No    Comment: quit  60 yrs ago    Current Meds  Medication Sig  . aspirin 81 MG tablet Take 81 mg by mouth every morning.   . Cholecalciferol (VITAMIN D) 50 MCG (2000 UT) tablet Take 2,000 Units by mouth daily.  . NP THYROID 120 MG tablet TAKE 1 TABLET(120 MG) BY MOUTH DAILY (Patient taking differently: Take 120 mg by mouth daily.)  . omeprazole (PRILOSEC) 40 MG capsule Take 40 mg by mouth daily.  Marland Kitchen thyroid (ARMOUR) 90 MG tablet Take 90 mg by mouth daily.  Marland Kitchen triamcinolone (KENALOG) 0.1 % Apply 1 application topically 2 (two) times daily.       Depression screen [facility] 2/9 05/02/2020 08/12/2019  Decreased Interest 0 0  Down, Depressed, Hopeless 0 0  PHQ - 2 Score 0 0  Altered sleeping 0 -  Tired, decreased energy 0 -  Change in appetite 0 -  Feeling bad or failure about yourself  0 -  Trouble concentrating 0 -  Moving slowly or fidgety/restless 0 -  Suicidal thoughts 0 -  PHQ-9 Score 0 -  Difficult doing work/chores Not difficult at all -     Objective:   Today's Vitals: BP 128/80   Pulse 69   Temp (!) 97.1 F (36.2 C) (Temporal)   Ht 5\' 5"  (1.651 m)   Wt 182 lb (82.6 kg)   SpO2 97%   BMI 30.29 kg/m  Vitals with BMI 06/20/2020 06/15/2020 05/02/2020  Height 5\' 5"  5'  5" 5\' 5"   Weight 182 lbs (No Data) 188 lbs 10 oz  BMI 52.84 - 13.24  Systolic 401 (No Data) 027  Diastolic 80 (No Data) 76  Pulse 69 (No Data) 78     Physical Exam   Looks systemically well.  Lung fields are clear.  There are no crackles, wheezing or bronchial breathing.  Saturation is 97% on room air.    Assessment   1. Hypothyroidism, unspecified type   2. Bronchitis       Tests ordered Orders Placed This Encounter  Procedures  . T3, free  . TSH  . T4, free     Plan: 1. I stressed the importance to the son that he needs to make sure he is taking NP thyroid 120 mg tablet, take 1 daily.  I will also let one of the daughters know. 2. Follow-up in 2 to 3 months.  Further recommendations will depend on blood results.   No orders of the defined types were placed in this encounter.   Doree Albee, MD

## 2020-07-04 DIAGNOSIS — Z85820 Personal history of malignant melanoma of skin: Secondary | ICD-10-CM | POA: Diagnosis not present

## 2020-07-04 DIAGNOSIS — D225 Melanocytic nevi of trunk: Secondary | ICD-10-CM | POA: Diagnosis not present

## 2020-07-04 DIAGNOSIS — L309 Dermatitis, unspecified: Secondary | ICD-10-CM | POA: Diagnosis not present

## 2020-07-04 DIAGNOSIS — L821 Other seborrheic keratosis: Secondary | ICD-10-CM | POA: Diagnosis not present

## 2020-07-04 DIAGNOSIS — L578 Other skin changes due to chronic exposure to nonionizing radiation: Secondary | ICD-10-CM | POA: Diagnosis not present

## 2020-07-04 DIAGNOSIS — L814 Other melanin hyperpigmentation: Secondary | ICD-10-CM | POA: Diagnosis not present

## 2020-07-04 DIAGNOSIS — L570 Actinic keratosis: Secondary | ICD-10-CM | POA: Diagnosis not present

## 2020-08-31 ENCOUNTER — Encounter (INDEPENDENT_AMBULATORY_CARE_PROVIDER_SITE_OTHER): Payer: Self-pay | Admitting: Gastroenterology

## 2020-08-31 ENCOUNTER — Other Ambulatory Visit: Payer: Self-pay

## 2020-08-31 ENCOUNTER — Other Ambulatory Visit (INDEPENDENT_AMBULATORY_CARE_PROVIDER_SITE_OTHER): Payer: Self-pay | Admitting: Internal Medicine

## 2020-08-31 ENCOUNTER — Encounter (INDEPENDENT_AMBULATORY_CARE_PROVIDER_SITE_OTHER): Payer: Self-pay | Admitting: *Deleted

## 2020-08-31 ENCOUNTER — Encounter (INDEPENDENT_AMBULATORY_CARE_PROVIDER_SITE_OTHER): Payer: Self-pay | Admitting: Internal Medicine

## 2020-08-31 ENCOUNTER — Ambulatory Visit (INDEPENDENT_AMBULATORY_CARE_PROVIDER_SITE_OTHER): Payer: Medicare Other | Admitting: Gastroenterology

## 2020-08-31 ENCOUNTER — Ambulatory Visit (INDEPENDENT_AMBULATORY_CARE_PROVIDER_SITE_OTHER): Payer: Medicare Other | Admitting: Internal Medicine

## 2020-08-31 ENCOUNTER — Encounter (INDEPENDENT_AMBULATORY_CARE_PROVIDER_SITE_OTHER): Payer: Self-pay | Admitting: Nurse Practitioner

## 2020-08-31 VITALS — BP 118/72 | HR 77 | Temp 98.0°F | Ht 62.0 in | Wt 178.0 lb

## 2020-08-31 VITALS — BP 122/70 | HR 59 | Temp 97.5°F | Ht 65.0 in | Wt 177.8 lb

## 2020-08-31 DIAGNOSIS — R1319 Other dysphagia: Secondary | ICD-10-CM | POA: Diagnosis not present

## 2020-08-31 DIAGNOSIS — K222 Esophageal obstruction: Secondary | ICD-10-CM | POA: Diagnosis not present

## 2020-08-31 DIAGNOSIS — R413 Other amnesia: Secondary | ICD-10-CM | POA: Diagnosis not present

## 2020-08-31 NOTE — Progress Notes (Signed)
[name], M.D. Gastroenterology & Hepatology [facility] [address] [place], [place] [address]  Primary Care Physician: Doree Albee, MD [place] [address]  I will communicate my assessment and recommendations to the referring MD via EMR.  Problems: 1. History of Schatzki's ring 2. Recurrent dysphagia  History of Present Illness: Evan Miller is a 87 y.o. male with past medical history of hypothyroidism, arthritis, history of Schatzki's ring status post dilation and GERD, who presents for follow up of dysphagia.  The patient was last seen on 04/17/20. At that time, the patient was switched to omeprazole 40 mg qday and was scheduled for an EGD. Patient had to cancel his EGD as he had a episode of bronchitis in January, so had to he wants to reschedule his appointment.  Daughter is present during the clinic encounter. Patient has tried to modify his diet as much as possible to avoid having choking and dysphagia episodes. He reports that cornbread, chicken, bread and rice triggers the dysphagia episodes so he tries to avoid them . States his dysphagia episodes are the same as before.  The patient denies having any nausea, vomiting, fever, chills, hematochezia, melena, hematemesis, abdominal distention, abdominal pain, diarrhea, jaundice, pruritus or weight loss.  Daughter reports that he has not been strictly compliant to the medication as he misses some doses occasionally.  Last EGD: 12/29/2017 - presence of stenosis at 34 cm from incisors at GE junction, dilated up to 18 mmHg with balloon with mucosal disruption  Past Medical History: Past Medical History:  Diagnosis Date   Arthritis    back,shoulders and hips   Cancer ([place])    basal cell/ Melanoma 1997 left  shoulder/  PROSTATE   GERD (gastroesophageal reflux disease)    Hepatitis    age 53 years   Hypothyroidism    PONV (postoperative  nausea and vomiting)    Prostate CA [facility])     Past Surgical History: Past Surgical History:  Procedure Laterality Date   CATARACT EXTRACTION W/PHACO  12/16/2011   Procedure: CATARACT EXTRACTION PHACO AND INTRAOCULAR LENS PLACEMENT ([place]);  Surgeon: Tonny Branch, MD;  [facility];  Service: Ophthalmology;  Laterality: Left;  CDE:17.24    CATARACT EXTRACTION W/PHACO  12/30/2011   Procedure: CATARACT EXTRACTION PHACO AND INTRAOCULAR LENS PLACEMENT ([facility]);  Surgeon: Tonny Branch, MD;  [facility]: [facility];  Service: Ophthalmology;  Laterality: Right;  CDE 15.40   COLONOSCOPY     COLONOSCOPY N/A 08/07/2012   Procedure: COLONOSCOPY;  Surgeon: Rogene Houston, MD;  [facility];  Service: Endoscopy;  Laterality: N/A;  10:00   COLONOSCOPY N/A 11/22/2015   Procedure: COLONOSCOPY;  Surgeon: Rogene Houston, MD;  [facility];  Service: Endoscopy;  Laterality: N/A;  12:00 - moved to 10:30 - Ann to notify   ESOPHAGEAL DILATION N/A 02/10/2015   Procedure: ESOPHAGEAL DILATION;  Surgeon: Rogene Houston, MD;  [facility];  Service: Endoscopy;  Laterality: N/A;   ESOPHAGEAL DILATION N/A 03/29/2015   Procedure: ESOPHAGEAL DILATION;  Surgeon: Rogene Houston, MD;  [facility];  Service: Endoscopy;  Laterality: N/A;   ESOPHAGEAL DILATION  11/22/2015   Procedure: ESOPHAGEAL DILATION;  Surgeon: Rogene Houston, MD;  [facility];  Service: Endoscopy;;   ESOPHAGEAL DILATION N/A 11/19/2017   Procedure: ESOPHAGEAL DILATION;  Surgeon: Rogene Houston, MD;  [facility];  Service: Endoscopy;  Laterality: N/A;  ESOPHAGEAL DILATION N/A 12/29/2017   Procedure: ESOPHAGEAL DILATION;  Surgeon: Rogene Houston, MD;  [facility];  Service: Endoscopy;  Laterality: N/A;   ESOPHAGOGASTRODUODENOSCOPY N/A 02/10/2015   Procedure: ESOPHAGOGASTRODUODENOSCOPY (EGD);  Surgeon: Rogene Houston, MD;  [facility];  Service: Endoscopy;   Laterality: N/A;  925 - moved to 10:20 - Ann to notify pt   ESOPHAGOGASTRODUODENOSCOPY N/A 03/29/2015   Procedure: ESOPHAGOGASTRODUODENOSCOPY (EGD);  Surgeon: Rogene Houston, MD;  [facility];  Service: Endoscopy;  Laterality: N/A;  145 - moved to 8:30 - Ann notified pt   ESOPHAGOGASTRODUODENOSCOPY N/A 11/22/2015   Procedure: ESOPHAGOGASTRODUODENOSCOPY (EGD);  Surgeon: Rogene Houston, MD;  [facility];  Service: Endoscopy;  Laterality: N/A;   ESOPHAGOGASTRODUODENOSCOPY N/A 10/27/2017   Procedure: ESOPHAGOGASTRODUODENOSCOPY (EGD);  Surgeon: Daneil Dolin, MD;  [facility];  Service: Endoscopy;  Laterality: N/A;  removal of food impaction   ESOPHAGOGASTRODUODENOSCOPY N/A 11/19/2017   Procedure: ESOPHAGOGASTRODUODENOSCOPY (EGD);  Surgeon: Rogene Houston, MD;  [facility];  Service: Endoscopy;  Laterality: N/A;  7:30   ESOPHAGOGASTRODUODENOSCOPY N/A 12/29/2017   Procedure: ESOPHAGOGASTRODUODENOSCOPY (EGD);  Surgeon: Rogene Houston, MD;  [facility];  Service: Endoscopy;  Laterality: N/A;  [place]     bilateral   JOINT REPLACEMENT     left knee 9/12   MELANOMA EXCISION N/A 06/11/2017   Procedure: WIDE LOCAL EXCISION WITH ADVANCEMENT FLAP CLOSURE LEFT NECK MELANOMA;  Surgeon: Stark Klein, MD;  Location: [place];  Service: General;  Laterality: N/A;  GENERAL AND LOCAL   TOTAL KNEE ARTHROPLASTY  06/24/2011   Procedure: TOTAL KNEE ARTHROPLASTY;  Surgeon: Gearlean Alf;  [facility];  Service: Orthopedics;  Laterality: Right;    Family History: Family History  Problem Relation Age of Onset   Breast cancer Mother    Prostate cancer Brother    Nephritis Brother    Rectal cancer Brother    Cirrhosis Brother    Liver cancer Brother    Healthy Daughter    Healthy Son     Social History: Social History   Tobacco Use  Smoking Status Former Smoker    Packs/day: 1.00   Years: 30.00   Pack years: 30.00   Types: Cigarettes   Quit date: 06/16/1962   Years since quitting: 58.2  Smokeless Tobacco Former User   Types: Chew   Quit date: 06/16/2006   Social History   Substance and Sexual Activity  Alcohol Use No   Comment: quit  60 yrs ago   Social History   Substance and Sexual Activity  Drug Use No    Allergies: Allergies  Allergen Reactions   Ciprofloxacin Hives    Hives at IV infusion site   Penicillins Other (See Comments)    Reaction: large knots on head Has patient had a PCN reaction causing immediate rash, facial/tongue/throat swelling, SOB or lightheadedness with hypotension: No Has patient had a PCN reaction causing severe rash involving mucus membranes or skin necrosis: No Has patient had a PCN reaction that required hospitalization No Has patient had a PCN reaction occurring within the last 10 years: No If all of the above answers are "NO", then may proceed with Cephalosporin use.    Sulfa Antibiotics Hives    Medications: Current Outpatient Medications  Medication Sig Dispense Refill   aspirin 81 MG tablet Take 81 mg by mouth every morning.  30 tablet    Cholecalciferol (VITAMIN D) 50 MCG (2000 UT) tablet Take 2,000 Units by mouth daily.     NP THYROID 120 MG tablet TAKE 1 TABLET(120 MG) BY MOUTH DAILY (Patient taking differently: Take 120 mg by mouth daily.) 30 tablet 3   omeprazole (PRILOSEC) 40 MG capsule Take 40 mg by mouth daily.     triamcinolone (KENALOG) 0.1 % Apply 1 application topically 2 (two) times daily. (Patient not taking: Reported on 08/31/2020) 30 g 0   No current facility-administered medications for this visit.    Review of Systems: GENERAL: negative for malaise, night sweats HEENT: No changes in hearing or vision, no nose bleeds or other nasal problems. NECK: Negative for lumps, goiter, pain and significant neck swelling RESPIRATORY: Negative for cough,  wheezing CARDIOVASCULAR: Negative for chest pain, leg swelling, palpitations, orthopnea GI: SEE HPI MUSCULOSKELETAL: Negative for joint pain or swelling, back pain, and muscle pain. SKIN: Negative for lesions, rash PSYCH: Negative for sleep disturbance, mood disorder and recent psychosocial stressors. HEMATOLOGY Negative for prolonged bleeding, bruising easily, and swollen nodes. ENDOCRINE: Negative for cold or heat intolerance, polyuria, polydipsia and goiter. NEURO: negative for tremor, gait imbalance, syncope and seizures. The remainder of the review of systems is noncontributory.   Physical Exam: BP 118/72 (BP Location: Left Arm, Patient Position: Sitting, Cuff Size: Large)    Pulse 77    Temp 98 F (36.7 C) (Oral)    Ht 5\' 2"  (1.575 m)    Wt 178 lb (80.7 kg)    BMI 32.56 kg/m  GENERAL: The patient is AO x3, in no acute distress. HEENT: Head is normocephalic and atraumatic. EOMI are intact. Mouth is well hydrated and without lesions. NECK: Supple. No masses LUNGS: Clear to auscultation. No presence of rhonchi/wheezing/rales. Adequate chest expansion HEART: RRR, normal s1 and s2. ABDOMEN: Soft, nontender, no guarding, no peritoneal signs, and nondistended. BS +. No masses. EXTREMITIES: Without any cyanosis, clubbing, rash, lesions or edema. NEUROLOGIC: AOx3, no focal motor deficit. SKIN: no jaundice, no rashes  Imaging/Labs: as above  I personally reviewed and interpreted the available labs, imaging and endoscopic files.  Impression and Plan: Evan Miller is a 87 y.o. male with past medical history of hypothyroidism, arthritis, history of Schatzki's ring status post dilation and GERD, who presents for follow up of dysphagia. 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 I reinforced the importance of compliance to medications as recurrent GERD will lead to  recurrent strictures and treatment failure. Both there daughter and the patient understood and agreed.  - Schedule EGD with ED - Continue daily Prilosec 40 mg - Explained presumed etiology of reflux symptoms. Instruction provided in the use of antireflux medication - patient should take medication in the morning 30-45 minutes before eating breakfast. Discussed avoidance of eating within 2 hours of lying down to sleep and benefit of blocks to elevate head of bed.  All questions were answered.      Harvel Quale, MD Gastroenterology and Hepatology [facility]

## 2020-08-31 NOTE — H&P (View-Only) (Signed)
Evan Miller, M.D. Gastroenterology & Hepatology [facility] [address] [place], [place] [address]  Primary Care Physician: Doree Albee, MD [place] [address]  I will communicate my assessment and recommendations to the referring MD via EMR.  Problems: 1. History of Schatzki's ring 2. Recurrent dysphagia  History of Present Illness: Evan Miller is a 87 y.o. male with past medical history of hypothyroidism, arthritis, history of Schatzki's ring status post dilation and GERD, who presents for follow up of dysphagia.  The patient was last seen on 04/17/20. At that time, the patient was switched to omeprazole 40 mg qday and was scheduled for an EGD. Patient had to cancel his EGD as he had a episode of bronchitis in January, so had to he wants to reschedule his appointment.  Daughter is present during the clinic encounter. Patient has tried to modify his diet as much as possible to avoid having choking and dysphagia episodes. He reports that cornbread, chicken, bread and rice triggers the dysphagia episodes so he tries to avoid them . States his dysphagia episodes are the same as before.  The patient denies having any nausea, vomiting, fever, chills, hematochezia, melena, hematemesis, abdominal distention, abdominal pain, diarrhea, jaundice, pruritus or weight loss.  Daughter reports that he has not been strictly compliant to the medication as he misses some doses occasionally.  Last EGD: 12/29/2017 - presence of stenosis at 34 cm from incisors at GE junction, dilated up to 18 mmHg with balloon with mucosal disruption  Past Medical History: Past Medical History:  Diagnosis Date  . Arthritis    back,shoulders and hips  . Cancer ([place])    basal cell/ Melanoma 1997 left  shoulder/  PROSTATE  . GERD (gastroesophageal reflux disease)   . Hepatitis    age 10 years  . Hypothyroidism   . PONV (postoperative  nausea and vomiting)   . Prostate CA [facility])     Past Surgical History: Past Surgical History:  Procedure Laterality Date  . CATARACT EXTRACTION W/PHACO  12/16/2011   Procedure: CATARACT EXTRACTION PHACO AND INTRAOCULAR LENS PLACEMENT ([facility]);  Surgeon: Tonny Branch, MD;  [facility];  Service: Ophthalmology;  Laterality: Left;  CDE:17.24   . CATARACT EXTRACTION W/PHACO  12/30/2011   Procedure: CATARACT EXTRACTION PHACO AND INTRAOCULAR LENS PLACEMENT ([facility]);  Surgeon: Tonny Branch, MD;  [facility];  Service: Ophthalmology;  Laterality: Right;  CDE 15.40  . COLONOSCOPY    . COLONOSCOPY N/A 08/07/2012   Procedure: COLONOSCOPY;  Surgeon: Rogene Houston, MD;  [facility];  Service: Endoscopy;  Laterality: N/A;  10:00  . COLONOSCOPY N/A 11/22/2015   Procedure: COLONOSCOPY;  Surgeon: Rogene Houston, MD;  [facility];  Service: Endoscopy;  Laterality: N/A;  12:00 - moved to 10:30 - Ann to notify  . ESOPHAGEAL DILATION N/A 02/10/2015   Procedure: ESOPHAGEAL DILATION;  Surgeon: Rogene Houston, MD;  [facility];  Service: Endoscopy;  Laterality: N/A;  . ESOPHAGEAL DILATION N/A 03/29/2015   Procedure: ESOPHAGEAL DILATION;  Surgeon: Rogene Houston, MD;  [facility];  Service: Endoscopy;  Laterality: N/A;  . ESOPHAGEAL DILATION  11/22/2015   Procedure: ESOPHAGEAL DILATION;  Surgeon: Rogene Houston, MD;  [facility];  Service: Endoscopy;;  . ESOPHAGEAL DILATION N/A 11/19/2017   Procedure: ESOPHAGEAL DILATION;  Surgeon: Rogene Houston, MD;  [facility];  Service: Endoscopy;  Laterality: N/A;  .  ESOPHAGEAL DILATION N/A 12/29/2017   Procedure: ESOPHAGEAL DILATION;  Surgeon: Rogene Houston, MD;  [facility];  Service: Endoscopy;  Laterality: N/A;  . ESOPHAGOGASTRODUODENOSCOPY N/A 02/10/2015   Procedure: ESOPHAGOGASTRODUODENOSCOPY (EGD);  Surgeon: Rogene Houston, MD;  [facility];  Service: Endoscopy;   Laterality: N/A;  925 - moved to 10:20 - Ann to notify pt  . ESOPHAGOGASTRODUODENOSCOPY N/A 03/29/2015   Procedure: ESOPHAGOGASTRODUODENOSCOPY (EGD);  Surgeon: Rogene Houston, MD;  [facility];  Service: Endoscopy;  Laterality: N/A;  145 - moved to 8:30 - Ann notified pt  . ESOPHAGOGASTRODUODENOSCOPY N/A 11/22/2015   Procedure: ESOPHAGOGASTRODUODENOSCOPY (EGD);  Surgeon: Rogene Houston, MD;  [facility];  Service: Endoscopy;  Laterality: N/A;  . ESOPHAGOGASTRODUODENOSCOPY N/A 10/27/2017   Procedure: ESOPHAGOGASTRODUODENOSCOPY (EGD);  Surgeon: Daneil Dolin, MD;  [facility];  Service: Endoscopy;  Laterality: N/A;  removal of food impaction  . ESOPHAGOGASTRODUODENOSCOPY N/A 11/19/2017   Procedure: ESOPHAGOGASTRODUODENOSCOPY (EGD);  Surgeon: Rogene Houston, MD;  [facility];  Service: Endoscopy;  Laterality: N/A;  7:30  . ESOPHAGOGASTRODUODENOSCOPY N/A 12/29/2017   Procedure: ESOPHAGOGASTRODUODENOSCOPY (EGD);  Surgeon: Rogene Houston, MD;  [facility];  Service: Endoscopy;  Laterality: N/A;  730  . HERNIA REPAIR    . HYDROCELE EXCISION     bilateral  . JOINT REPLACEMENT     left knee 9/12  . MELANOMA EXCISION N/A 06/11/2017   Procedure: WIDE LOCAL EXCISION WITH ADVANCEMENT FLAP CLOSURE LEFT NECK MELANOMA;  Surgeon: Stark Klein, MD;  Location: [place];  Service: General;  Laterality: N/A;  GENERAL AND LOCAL  . TOTAL KNEE ARTHROPLASTY  06/24/2011   Procedure: TOTAL KNEE ARTHROPLASTY;  Surgeon: Gearlean Alf;  [facility];  Service: Orthopedics;  Laterality: Right;    Family History: Family History  Problem Relation Age of Onset  . Breast cancer Mother   . Prostate cancer Brother   . Nephritis Brother   . Rectal cancer Brother   . Cirrhosis Brother   . Liver cancer Brother   . Healthy Daughter   . Healthy Son     Social History: Social History   Tobacco Use  Smoking Status Former Smoker  .  Packs/day: 1.00  . Years: 30.00  . Pack years: 30.00  . Types: Cigarettes  . Quit date: 06/16/1962  . Years since quitting: 38.2  Smokeless Tobacco Former Systems developer  . Types: Chew  . Quit date: 06/16/2006   Social History   Substance and Sexual Activity  Alcohol Use No   Comment: quit  60 yrs ago   Social History   Substance and Sexual Activity  Drug Use No    Allergies: Allergies  Allergen Reactions  . Ciprofloxacin Hives    Hives at IV infusion site  . Penicillins Other (See Comments)    Reaction: large knots on head Has patient had a PCN reaction causing immediate rash, facial/tongue/throat swelling, SOB or lightheadedness with hypotension: No Has patient had a PCN reaction causing severe rash involving mucus membranes or skin necrosis: No Has patient had a PCN reaction that required hospitalization No Has patient had a PCN reaction occurring within the last 10 years: No If all of the above answers are "NO", then may proceed with Cephalosporin use.   . Sulfa Antibiotics Hives    Medications: Current Outpatient Medications  Medication Sig Dispense Refill  . aspirin 81 MG tablet Take 81 mg by mouth every morning.  30 tablet   . Cholecalciferol (VITAMIN D) 50 MCG (2000 UT) tablet Take 2,000 Units by mouth daily.    . NP THYROID 120 MG tablet TAKE 1 TABLET(120 MG) BY MOUTH DAILY (Patient taking differently: Take 120 mg by mouth daily.) 30 tablet 3  . omeprazole (PRILOSEC) 40 MG capsule Take 40 mg by mouth daily.    Marland Kitchen triamcinolone (KENALOG) 0.1 % Apply 1 application topically 2 (two) times daily. (Patient not taking: Reported on 08/31/2020) 30 g 0   No current facility-administered medications for this visit.    Review of Systems: GENERAL: negative for malaise, night sweats HEENT: No changes in hearing or vision, no nose bleeds or other nasal problems. NECK: Negative for lumps, goiter, pain and significant neck swelling RESPIRATORY: Negative for cough,  wheezing CARDIOVASCULAR: Negative for chest pain, leg swelling, palpitations, orthopnea GI: SEE HPI MUSCULOSKELETAL: Negative for joint pain or swelling, back pain, and muscle pain. SKIN: Negative for lesions, rash PSYCH: Negative for sleep disturbance, mood disorder and recent psychosocial stressors. HEMATOLOGY Negative for prolonged bleeding, bruising easily, and swollen nodes. ENDOCRINE: Negative for cold or heat intolerance, polyuria, polydipsia and goiter. NEURO: negative for tremor, gait imbalance, syncope and seizures. The remainder of the review of systems is noncontributory.   Physical Exam: BP 118/72 (BP Location: Left Arm, Patient Position: Sitting, Cuff Size: Large)   Pulse 77   Temp 98 F (36.7 C) (Oral)   Ht 5\' 2"  (1.575 m)   Wt 178 lb (80.7 kg)   BMI 32.56 kg/m  GENERAL: The patient is AO x3, in no acute distress. HEENT: Head is normocephalic and atraumatic. EOMI are intact. Mouth is well hydrated and without lesions. NECK: Supple. No masses LUNGS: Clear to auscultation. No presence of rhonchi/wheezing/rales. Adequate chest expansion HEART: RRR, normal s1 and s2. ABDOMEN: Soft, nontender, no guarding, no peritoneal signs, and nondistended. BS +. No masses. EXTREMITIES: Without any cyanosis, clubbing, rash, lesions or edema. NEUROLOGIC: AOx3, no focal motor deficit. SKIN: no jaundice, no rashes  Imaging/Labs: as above  I personally reviewed and interpreted the available labs, imaging and endoscopic files.  Impression and Plan: Evan Miller is a 87 y.o. male with past medical history of hypothyroidism, arthritis, history of Schatzki's ring status post dilation and GERD, who presents for follow up of dysphagia. 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 I reinforced the importance of compliance to medications as recurrent GERD will lead to  recurrent strictures and treatment failure. Both there daughter and the patient understood and agreed.  - Schedule EGD with ED - Continue daily Prilosec 40 mg - Explained presumed etiology of reflux symptoms. Instruction provided in the use of antireflux medication - patient should take medication in the morning 30-45 minutes before eating breakfast. Discussed avoidance of eating within 2 hours of lying down to sleep and benefit of blocks to elevate head of bed.  All questions were answered.      Harvel Quale, MD Gastroenterology and Hepatology [facility]

## 2020-08-31 NOTE — Patient Instructions (Signed)
Schedule EGD with ED Continue daily Prilosec Explained presumed etiology of reflux symptoms. Instruction provided in the use of antireflux medication - patient should take medication in the morning 30-45 minutes before eating breakfast. Discussed avoidance of eating within 2 hours of lying down to sleep and benefit of blocks to elevate head of bed.

## 2020-08-31 NOTE — Progress Notes (Signed)
Metrics: Intervention Frequency ACO  Documented Smoking Status Yearly  Screened one or more times in 24 months  Cessation Counseling or  Active cessation medication Past 24 months  Past 24 months   Guideline developer: UpToDate (See UpToDate for funding source) Date Released: 2014       Wellness Office Visit  Subjective:  Patient ID: Evan Miller, male    DOB: 1926-03-26  Age: 87 y.o. MRN: [id number]  CC: This patient came in because of concern regarding family with his memory and financial decision making process.  He also has dysphagia. HPI  Apparently he is having more dysphagia now when he previously had esophageal dilatation with gastroenterology and we will refer him for this again. It appears that the patient has been taking out approximately $300 a week from his bank account and giving it to a friend of his.  The family is concerned regarding his ability to make financial decisions for himself.  They have noticed that he has been becoming more forgetful. Past Medical History:  Diagnosis Date  . Arthritis    back,shoulders and hips  . Cancer ([place])    basal cell/ Melanoma 1997 left  shoulder/  PROSTATE  . GERD (gastroesophageal reflux disease)   . Hepatitis    age 73 years  . Hypothyroidism   . PONV (postoperative nausea and vomiting)   . Prostate CA [facility])    Past Surgical History:  Procedure Laterality Date  . CATARACT EXTRACTION W/PHACO  12/16/2011   Procedure: CATARACT EXTRACTION PHACO AND INTRAOCULAR LENS PLACEMENT (IOC);  Surgeon: Tonny Branch, MD;  Location: [facility];  Service: Ophthalmology;  Laterality: Left;  CDE:17.24   . CATARACT EXTRACTION W/PHACO  12/30/2011   Procedure: CATARACT EXTRACTION PHACO AND INTRAOCULAR LENS PLACEMENT (IOC);  Surgeon: Tonny Branch, MD;  Location: [facility];  Service: Ophthalmology;  Laterality: Right;  CDE 15.40  . COLONOSCOPY    . COLONOSCOPY N/A 08/07/2012   Procedure: COLONOSCOPY;  Surgeon: Rogene Houston, MD;  [facility];   Service: Endoscopy;  Laterality: N/A;  10:00  . COLONOSCOPY N/A 11/22/2015   Procedure: COLONOSCOPY;  Surgeon: Rogene Houston, MD;  [facility];  Service: Endoscopy;  Laterality: N/A;  12:00 - moved to 10:30 - Ann to notify  . ESOPHAGEAL DILATION N/A 02/10/2015   Procedure: ESOPHAGEAL DILATION;  Surgeon: Rogene Houston, MD;  [facility];  Service: Endoscopy;  Laterality: N/A;  . ESOPHAGEAL DILATION N/A 03/29/2015   Procedure: ESOPHAGEAL DILATION;  Surgeon: Rogene Houston, MD;  [facility];  Service: Endoscopy;  Laterality: N/A;  . ESOPHAGEAL DILATION  11/22/2015   Procedure: ESOPHAGEAL DILATION;  Surgeon: Rogene Houston, MD;  [facility];  Service: Endoscopy;;  . ESOPHAGEAL DILATION N/A 11/19/2017   Procedure: ESOPHAGEAL DILATION;  Surgeon: Rogene Houston, MD;  [facility];  Service: Endoscopy;  Laterality: N/A;  . ESOPHAGEAL DILATION N/A 12/29/2017   Procedure: ESOPHAGEAL DILATION;  Surgeon: Rogene Houston, MD;  [facility];  Service: Endoscopy;  Laterality: N/A;  . ESOPHAGOGASTRODUODENOSCOPY N/A 02/10/2015   Procedure: ESOPHAGOGASTRODUODENOSCOPY (EGD);  Surgeon: Rogene Houston, MD;  [facility];  Service: Endoscopy;  Laterality: N/A;  925 - moved to 10:20 - Ann to notify pt  . ESOPHAGOGASTRODUODENOSCOPY N/A 03/29/2015   Procedure: ESOPHAGOGASTRODUODENOSCOPY (EGD);  Surgeon: Rogene Houston, MD;  [facility];  Service: Endoscopy;  Laterality: N/A;  145 - moved to 8:30 - Lelon Frohlich  notified pt  . ESOPHAGOGASTRODUODENOSCOPY N/A 11/22/2015   Procedure: ESOPHAGOGASTRODUODENOSCOPY (EGD);  Surgeon: Rogene Houston, MD;  [facility];  Service: Endoscopy;  Laterality: N/A;  . ESOPHAGOGASTRODUODENOSCOPY N/A 10/27/2017   Procedure: ESOPHAGOGASTRODUODENOSCOPY (EGD);  Surgeon: Daneil Dolin, MD;  [facility];  Service: Endoscopy;  Laterality: N/A;  removal of food impaction  .  ESOPHAGOGASTRODUODENOSCOPY N/A 11/19/2017   Procedure: ESOPHAGOGASTRODUODENOSCOPY (EGD);  Surgeon: Rogene Houston, MD;  [facility];  Service: Endoscopy;  Laterality: N/A;  7:30  . ESOPHAGOGASTRODUODENOSCOPY N/A 12/29/2017   Procedure: ESOPHAGOGASTRODUODENOSCOPY (EGD);  Surgeon: Rogene Houston, MD;  [facility];  Service: Endoscopy;  Laterality: N/A;  730  . HERNIA REPAIR    . HYDROCELE EXCISION     bilateral  . JOINT REPLACEMENT     left knee 9/12  . MELANOMA EXCISION N/A 06/11/2017   Procedure: WIDE LOCAL EXCISION WITH ADVANCEMENT FLAP CLOSURE LEFT NECK MELANOMA;  Surgeon: Stark Klein, MD;  Location: [place];  Service: General;  Laterality: N/A;  GENERAL AND LOCAL  . TOTAL KNEE ARTHROPLASTY  06/24/2011   Procedure: TOTAL KNEE ARTHROPLASTY;  Surgeon: Gearlean Alf;  Location: [facility];  Service: Orthopedics;  Laterality: Right;     Family History  Problem Relation Age of Onset  . Breast cancer Mother   . Prostate cancer Brother   . Nephritis Brother   . Rectal cancer Brother   . Cirrhosis Brother   . Liver cancer Brother   . Healthy Daughter   . Healthy Son     Social History   Social History Narrative  . Not on file   Social History   Tobacco Use  . Smoking status: Former Smoker    Packs/day: 1.00    Years: 30.00    Pack years: 30.00    Types: Cigarettes    Quit date: 06/16/1962    Years since quitting: 58.2  . Smokeless tobacco: Former Systems developer    Types: [place] date: 06/16/2006  Substance Use Topics  . Alcohol use: No    Comment: quit  60 yrs ago    Current Meds  Medication Sig  . aspirin 81 MG tablet Take 81 mg by mouth every morning.   . Cholecalciferol (VITAMIN D) 50 MCG (2000 UT) tablet Take 2,000 Units by mouth daily.  . NP THYROID 120 MG tablet TAKE 1 TABLET(120 MG) BY MOUTH DAILY (Patient taking differently: Take 120 mg by mouth daily.)  . omeprazole (PRILOSEC) 40 MG capsule Take 40 mg by mouth daily.  Marland Kitchen  triamcinolone (KENALOG) 0.1 % Apply 1 application topically 2 (two) times daily.     [place] Office Visit from 05/02/2020 in [facility]  PHQ-9 Total Score 0      Objective:   Today's Vitals: BP 122/70   Pulse (!) 59   Temp (!) 97.5 F (36.4 C) (Temporal)   Ht 5\' 5"  (1.651 m)   Wt 177 lb 12.8 oz (80.6 kg)   SpO2 98%   BMI 29.59 kg/m  Vitals with BMI 08/31/2020 06/20/2020 06/15/2020  Height 5\' 5"  5\' 5"  5\' 5"   Weight 177 lbs 13 oz 182 lbs (No Data)  BMI 15.40 08.67 -  Systolic 619 509 (No Data)  Diastolic 70 80 (No Data)  Pulse 59 69 (No Data)     Physical Exam   Blood pressure is well controlled.  I performed a Mini-Mental state examination on him and he scored  23/30.  This would indicate mild cognitive impairment.  He was certainly also not able to do a clock face with accurate time showing.    Assessment   1. Esophageal dysphagia   2. Memory loss       Tests ordered Orders Placed This Encounter  Procedures  . Ambulatory referral to Gastroenterology     Plan: 1. I feel that he has at the very least mild dementia and probably more moderate dementia.  I do not believe he is capable of making his financial decisions and I will give a letter to the family regarding this for his bank. 2. I will refer him to gastroenterology, Dr. Laural Golden , who he has seen before. 3. Follow-up in a couple of months. No orders of the defined types were placed in this encounter.   Doree Albee, MD

## 2020-09-01 ENCOUNTER — Other Ambulatory Visit (INDEPENDENT_AMBULATORY_CARE_PROVIDER_SITE_OTHER): Payer: Self-pay | Admitting: *Deleted

## 2020-09-05 NOTE — Patient Instructions (Signed)
Evan Miller  09/05/2020     @PREFPERIOPPHARMACY @   Your procedure is scheduled on  09/08/2020   Report to [facility] at  [place].M.   Call this number if you have problems the morning of surgery:  [phone]   Remember:  Follow the diet and prep instructions given to you by the office.                    Take these medicines the morning of surgery with A SIP OF WATER  NP Thyroid, prilosec.     Please brush your teeth.  Do not wear jewelry, make-up or nail polish.  Do not wear lotions, powders, or perfumes, or deodorant.  Do not shave 48 hours prior to surgery.  Men may shave face and neck.  Do not bring valuables to the hospital.  [facility] is not responsible for any belongings or valuables.   Contacts, dentures or bridgework may not be worn into surgery.  Leave your suitcase in the car.  After surgery it may be brought to your room.  For patients admitted to the hospital, discharge time will be determined by your treatment team.  Patients discharged the day of surgery will not be allowed to drive home and must have someone with them for 24 hours.    Special instructions:  DO NOT smoke tobacco or vape the morning of your procedure.    Please read over the following fact sheets that you were given. Anesthesia Post-op Instructions and Care and Recovery After Surgery       Upper Endoscopy, Adult, Care After This sheet gives you information about how to care for yourself after your procedure. Your health care provider may also give you more specific instructions. If you have problems or questions, contact your health care provider. What can I expect after the procedure? After the procedure, it is common to have:  A sore throat.  Mild stomach pain or discomfort.  Bloating.  Nausea. Follow these instructions at home:  Follow instructions from your health care provider about what to eat or drink after your procedure.  Return to your normal  activities as told by your health care provider. Ask your health care provider what activities are safe for you.  Take over-the-counter and prescription medicines only as told by your health care provider.  If you were given a sedative during the procedure, it can affect you for several hours. Do not drive or operate machinery until your health care provider says that it is safe.  Keep all follow-up visits as told by your health care provider. This is important.   Contact a health care provider if you have:  A sore throat that lasts longer than one day.  Trouble swallowing. Get help right away if:  You vomit blood or your vomit looks like coffee grounds.  You have: ? A fever. ? Bloody, black, or tarry stools. ? A severe sore throat or you cannot swallow. ? Difficulty breathing. ? Severe pain in your chest or abdomen. Summary  After the procedure, it is common to have a sore throat, mild stomach discomfort, bloating, and nausea.  If you were given a sedative during the procedure, it can affect you for several hours. Do not drive or operate machinery until your health care provider says that it is safe.  Follow instructions from your health care provider about what to eat or drink after your procedure.  Return to your normal activities as told by your health care provider. This information is not intended to replace advice given to you by your health care provider. Make sure you discuss any questions you have with your health care provider. Document Revised: 05/25/2019 Document Reviewed: 10/27/2017 Elsevier Patient Education  2021 [place].  https://www.asge.org/home/for-patients/patient-information/understanding-eso-dilation-updated">  Esophageal Dilatation Esophageal dilatation, also called esophageal dilation, is a procedure to widen or open a blocked or narrowed part of the esophagus. The esophagus is the part of the body that moves food and liquid from the mouth to the  stomach. You may need this procedure if:  You have a buildup of scar tissue in your esophagus that makes it difficult, painful, or impossible to swallow. This can be caused by gastroesophageal reflux disease (GERD).  You have cancer of the esophagus.  There is a problem with how food moves through your esophagus. In some cases, you may need this procedure repeated at a later time to dilate the esophagus gradually. Tell a health care provider about:  Any allergies you have.  All medicines you are taking, including vitamins, herbs, eye drops, creams, and over-the-counter medicines.  Any problems you or family members have had with anesthetic medicines.  Any blood disorders you have.  Any surgeries you have had.  Any medical conditions you have.  Any antibiotic medicines you are required to take before dental procedures.  Whether you are pregnant or may be pregnant. What are the risks? Generally, this is a safe procedure. However, problems may occur, including:  Bleeding due to a tear in the lining of the esophagus.  A hole, or perforation, in the esophagus. What happens before the procedure?  Ask your health care provider about: ? Changing or stopping your regular medicines. This is especially important if you are taking diabetes medicines or blood thinners. ? Taking medicines such as aspirin and ibuprofen. These medicines can thin your blood. Do not take these medicines unless your health care provider tells you to take them. ? Taking over-the-counter medicines, vitamins, herbs, and supplements.  Follow instructions from your health care provider about eating or drinking restrictions.  Plan to have a responsible adult take you home from the hospital or clinic.  Plan to have a responsible adult care for you for the time you are told after you leave the hospital or clinic. This is important. What happens during the procedure?  You may be given a medicine to help you relax  (sedative).  A numbing medicine may be sprayed into the back of your throat, or you may gargle the medicine.  Your health care provider may perform the dilatation using various surgical instruments, such as: ? Simple dilators. This instrument is carefully placed in the esophagus to stretch it. ? Guided wire bougies. This involves using an endoscope to insert a wire into the esophagus. A dilator is passed over this wire to enlarge the esophagus. Then the wire is removed. ? Balloon dilators. An endoscope with a small balloon is inserted into the esophagus. The balloon is inflated to stretch the esophagus and open it up. The procedure may vary among health care providers and hospitals. What can I expect after the procedure?  Your blood pressure, heart rate, breathing rate, and blood oxygen level will be monitored until you leave the hospital or clinic.  Your throat may feel slightly sore and numb. This will get better over time.  You will not be allowed to eat or drink until your throat is no  longer numb.  When you are able to drink, urinate, and sit on the edge of the bed without nausea or dizziness, you may be able to return home. Follow these instructions at home:  Take over-the-counter and prescription medicines only as told by your health care provider.  If you were given a sedative during the procedure, it can affect you for several hours. Do not drive or operate machinery until your health care provider says that it is safe.  Plan to have a responsible adult care for you for the time you are told. This is important.  Follow instructions from your health care provider about any eating or drinking restrictions.  Do not use any products that contain nicotine or tobacco, such as cigarettes, e-cigarettes, and chewing tobacco. If you need help quitting, ask your health care provider.  Keep all follow-up visits. This is important. Contact a health care provider if:  You have a  fever.  You have pain that is not relieved by medicine. Get help right away if:  You have chest pain.  You have trouble breathing.  You have trouble swallowing.  You vomit blood.  You have black, tarry, or bloody stools. These symptoms may represent a serious problem that is an emergency. Do not wait to see if the symptoms will go away. Get medical help right away. Call your local emergency services (911 in the [place] Do not drive yourself to the hospital. Summary  Esophageal dilatation, also called esophageal dilation, is a procedure to widen or open a blocked or narrowed part of the esophagus.  Plan to have a responsible adult take you home from the hospital or clinic.  For this procedure, a numbing medicine may be sprayed into the back of your throat, or you may gargle the medicine.  Do not drive or operate machinery until your health care provider says that it is safe. This information is not intended to replace advice given to you by your health care provider. Make sure you discuss any questions you have with your health care provider. Document Revised: 10/13/2019 Document Reviewed: 10/13/2019 Elsevier Patient Education  2021 [place] After This sheet gives you information about how to care for yourself after your procedure. Your health care provider may also give you more specific instructions. If you have problems or questions, contact your health care provider. What can I expect after the procedure? After the procedure, it is common to have:  Tiredness.  Forgetfulness about what happened after the procedure.  Impaired judgment for important decisions.  Nausea or vomiting.  Some difficulty with balance. Follow these instructions at home: For the time period you were told by your health care provider:  Rest as needed.  Do not participate in activities where you could fall or become injured.  Do not drive or use  machinery.  Do not drink alcohol.  Do not take sleeping pills or medicines that cause drowsiness.  Do not make important decisions or sign legal documents.  Do not take care of children on your own.      Eating and drinking  Follow the diet that is recommended by your health care provider.  Drink enough fluid to keep your urine pale yellow.  If you vomit: ? Drink water, juice, or soup when you can drink without vomiting. ? Make sure you have little or no nausea before eating solid foods. General instructions  Have a responsible adult stay with you for the time you are told.  It is important to have someone help care for you until you are awake and alert.  Take over-the-counter and prescription medicines only as told by your health care provider.  If you have sleep apnea, surgery and certain medicines can increase your risk for breathing problems. Follow instructions from your health care provider about wearing your sleep device: ? Anytime you are sleeping, including during daytime naps. ? While taking prescription pain medicines, sleeping medicines, or medicines that make you drowsy.  Avoid smoking.  Keep all follow-up visits as told by your health care provider. This is important. Contact a health care provider if:  You keep feeling nauseous or you keep vomiting.  You feel light-headed.  You are still sleepy or having trouble with balance after 24 hours.  You develop a rash.  You have a fever.  You have redness or swelling around the IV site. Get help right away if:  You have trouble breathing.  You have new-onset confusion at home. Summary  For several hours after your procedure, you may feel tired. You may also be forgetful and have poor judgment.  Have a responsible adult stay with you for the time you are told. It is important to have someone help care for you until you are awake and alert.  Rest as told. Do not drive or operate machinery. Do not drink  alcohol or take sleeping pills.  Get help right away if you have trouble breathing, or if you suddenly become confused. This information is not intended to replace advice given to you by your health care provider. Make sure you discuss any questions you have with your health care provider. Document Revised: 02/10/2020 Document Reviewed: 04/29/2019 Elsevier Patient Education  2021 [employer].

## 2020-09-06 ENCOUNTER — Other Ambulatory Visit: Payer: Self-pay

## 2020-09-06 ENCOUNTER — Encounter (HOSPITAL_COMMUNITY): Payer: Self-pay

## 2020-09-06 ENCOUNTER — Encounter (HOSPITAL_COMMUNITY)
Admission: RE | Admit: 2020-09-06 | Discharge: 2020-09-06 | Disposition: A | Discharge status: Home / Self Care | Payer: Medicare Other | Source: Ambulatory Visit | Attending: Gastroenterology | Admitting: Gastroenterology

## 2020-09-06 ENCOUNTER — Other Ambulatory Visit (HOSPITAL_COMMUNITY)
Admission: RE | Admit: 2020-09-06 | Discharge: 2020-09-06 | Disposition: A | Discharge status: Home / Self Care | Payer: Medicare Other | Source: Ambulatory Visit | Attending: Gastroenterology | Admitting: Gastroenterology

## 2020-09-06 DIAGNOSIS — Z20822 Contact with and (suspected) exposure to covid-19: Secondary | ICD-10-CM | POA: Diagnosis not present

## 2020-09-06 DIAGNOSIS — Z01812 Encounter for preprocedural laboratory examination: Secondary | ICD-10-CM | POA: Insufficient documentation

## 2020-09-06 LAB — SARS CORONAVIRUS 2 (TAT 6-24 HRS): SARS Coronavirus 2: NEGATIVE

## 2020-09-08 ENCOUNTER — Ambulatory Visit (HOSPITAL_COMMUNITY)
Admission: RE | Admit: 2020-09-08 | Discharge: 2020-09-08 | Disposition: A | Discharge status: Home / Self Care | Payer: Medicare Other | Attending: Gastroenterology | Admitting: Gastroenterology

## 2020-09-08 ENCOUNTER — Encounter (HOSPITAL_COMMUNITY)
Admission: RE | Disposition: A | Discharge status: Home / Self Care | Payer: Self-pay | Source: Home / Self Care | Attending: Gastroenterology

## 2020-09-08 ENCOUNTER — Ambulatory Visit (HOSPITAL_COMMUNITY): Payer: Medicare Other | Admitting: Anesthesiology

## 2020-09-08 DIAGNOSIS — Z87891 Personal history of nicotine dependence: Secondary | ICD-10-CM | POA: Insufficient documentation

## 2020-09-08 DIAGNOSIS — Z85820 Personal history of malignant melanoma of skin: Secondary | ICD-10-CM | POA: Insufficient documentation

## 2020-09-08 DIAGNOSIS — Z880 Allergy status to penicillin: Secondary | ICD-10-CM | POA: Diagnosis not present

## 2020-09-08 DIAGNOSIS — R1310 Dysphagia, unspecified: Secondary | ICD-10-CM | POA: Diagnosis not present

## 2020-09-08 DIAGNOSIS — E039 Hypothyroidism, unspecified: Secondary | ICD-10-CM | POA: Diagnosis not present

## 2020-09-08 DIAGNOSIS — K222 Esophageal obstruction: Secondary | ICD-10-CM | POA: Diagnosis not present

## 2020-09-08 DIAGNOSIS — Z79890 Hormone replacement therapy (postmenopausal): Secondary | ICD-10-CM | POA: Diagnosis not present

## 2020-09-08 DIAGNOSIS — Z7982 Long term (current) use of aspirin: Secondary | ICD-10-CM | POA: Diagnosis not present

## 2020-09-08 DIAGNOSIS — K219 Gastro-esophageal reflux disease without esophagitis: Secondary | ICD-10-CM | POA: Insufficient documentation

## 2020-09-08 DIAGNOSIS — Z8546 Personal history of malignant neoplasm of prostate: Secondary | ICD-10-CM | POA: Diagnosis not present

## 2020-09-08 DIAGNOSIS — Z79899 Other long term (current) drug therapy: Secondary | ICD-10-CM | POA: Insufficient documentation

## 2020-09-08 DIAGNOSIS — Z881 Allergy status to other antibiotic agents status: Secondary | ICD-10-CM | POA: Insufficient documentation

## 2020-09-08 DIAGNOSIS — Z882 Allergy status to sulfonamides status: Secondary | ICD-10-CM | POA: Diagnosis not present

## 2020-09-08 HISTORY — PX: ESOPHAGEAL DILATION: SHX303

## 2020-09-08 HISTORY — PX: BIOPSY: SHX5522

## 2020-09-08 HISTORY — PX: ESOPHAGOGASTRODUODENOSCOPY (EGD) WITH PROPOFOL: SHX5813

## 2020-09-08 SURGERY — ESOPHAGOGASTRODUODENOSCOPY (EGD) WITH PROPOFOL
Anesthesia: General

## 2020-09-08 MED ORDER — LIDOCAINE VISCOUS HCL 2 % MT SOLN
10.0000 mL | Freq: Once | OROMUCOSAL | Status: AC
  Administered 2020-09-08: 10 mL via OROMUCOSAL

## 2020-09-08 MED ORDER — LIDOCAINE HCL (CARDIAC) PF 100 MG/5ML IV SOSY
PREFILLED_SYRINGE | INTRAVENOUS | Status: DC | PRN
  Administered 2020-09-08: 50 mg via INTRATRACHEAL

## 2020-09-08 MED ORDER — LIDOCAINE VISCOUS HCL 2 % MT SOLN
OROMUCOSAL | Status: AC
  Filled 2020-09-08: qty 15

## 2020-09-08 MED ORDER — STERILE WATER FOR IRRIGATION IR SOLN
Status: DC | PRN
  Administered 2020-09-08: 100 mL

## 2020-09-08 MED ORDER — LACTATED RINGERS IV SOLN: INTRAVENOUS | Status: DC

## 2020-09-08 MED ORDER — PROPOFOL 10 MG/ML IV BOLUS
INTRAVENOUS | Status: DC | PRN
  Administered 2020-09-08: 40 mg via INTRAVENOUS

## 2020-09-08 MED ORDER — PROPOFOL 500 MG/50ML IV EMUL
INTRAVENOUS | Status: DC | PRN
  Administered 2020-09-08: 100 ug/kg/min via INTRAVENOUS

## 2020-09-08 MED ORDER — PHENYLEPHRINE HCL (PRESSORS) 10 MG/ML IV SOLN
INTRAVENOUS | Status: DC | PRN
  Administered 2020-09-08 (×2): 40 ug via INTRAVENOUS

## 2020-09-08 NOTE — Anesthesia Postprocedure Evaluation (Signed)
Anesthesia Post Note  Patient: Evan Miller  Procedure(s) Performed: ESOPHAGOGASTRODUODENOSCOPY (EGD) WITH PROPOFOL (N/A ) ESOPHAGEAL DILATION (N/A ) BIOPSY  Patient location during evaluation: Phase II Anesthesia Type: General Level of consciousness: awake Pain management: pain level controlled Vital Signs Assessment: post-procedure vital signs reviewed and stable Respiratory status: spontaneous breathing Cardiovascular status: stable Postop Assessment: no apparent nausea or vomiting Anesthetic complications: no   No complications documented.   Last Vitals:  Vitals:   09/08/20 0838 09/08/20 0845  BP: 137/75   Pulse: 73 69  Resp: (!) 9 17  Temp: 36.7 C   SpO2: 100% 98%    Last Pain:  Vitals:   09/08/20 0933  PainSc: 0-No pain                 Deyjah Kindel Hristova

## 2020-09-08 NOTE — Discharge Instructions (Signed)
You are being discharged to home.  Resume your previous diet.  We are waiting for your pathology results.  Continue your present medications, including Prilosec daily.        Upper Endoscopy, Adult, Care After This sheet gives you information about how to care for yourself after your procedure. Your health care provider may also give you more specific instructions. If you have problems or questions, contact your health care provider. What can I expect after the procedure? After the procedure, it is common to have:  A sore throat.  Mild stomach pain or discomfort.  Bloating.  Nausea. Follow these instructions at home:  Follow instructions from your health care provider about what to eat or drink after your procedure.  Return to your normal activities as told by your health care provider. Ask your health care provider what activities are safe for you.  Take over-the-counter and prescription medicines only as told by your health care provider.  If you were given a sedative during the procedure, it can affect you for several hours. Do not drive or operate machinery until your health care provider says that it is safe.  Keep all follow-up visits as told by your health care provider. This is important.   Contact a health care provider if you have:  A sore throat that lasts longer than one day.  Trouble swallowing. Get help right away if:  You vomit blood or your vomit looks like coffee grounds.  You have: ? A fever. ? Bloody, black, or tarry stools. ? A severe sore throat or you cannot swallow. ? Difficulty breathing. ? Severe pain in your chest or abdomen. Summary  After the procedure, it is common to have a sore throat, mild stomach discomfort, bloating, and nausea.  If you were given a sedative during the procedure, it can affect you for several hours. Do not drive or operate machinery until your health care provider says that it is safe.  Follow instructions from  your health care provider about what to eat or drink after your procedure.  Return to your normal activities as told by your health care provider. This information is not intended to replace advice given to you by your health care provider. Make sure you discuss any questions you have with your health care provider. Document Revised: 05/25/2019 Document Reviewed: 10/27/2017 Elsevier Patient Education  2021 [employer] After This sheet gives you information about how to care for yourself after your procedure. Your health care provider may also give you more specific instructions. If you have problems or questions, contact your health care provider. What can I expect after the procedure? After the procedure, it is common to have:  Tiredness.  Forgetfulness about what happened after the procedure.  Impaired judgment for important decisions.  Nausea or vomiting.  Some difficulty with balance. Follow these instructions at home: For the time period you were told by your health care provider:  Rest as needed.  Do not participate in activities where you could fall or become injured.  Do not drive or use machinery.  Do not drink alcohol.  Do not take sleeping pills or medicines that cause drowsiness.  Do not make important decisions or sign legal documents.  Do not take care of children on your own.      Eating and drinking  Follow the diet that is recommended by your health care provider.  Drink enough fluid to keep your urine pale  yellow.  If you vomit: ? Drink water, juice, or soup when you can drink without vomiting. ? Make sure you have little or no nausea before eating solid foods. General instructions  Have a responsible adult stay with you for the time you are told. It is important to have someone help care for you until you are awake and alert.  Take over-the-counter and prescription medicines only as told by your health  care provider.  If you have sleep apnea, surgery and certain medicines can increase your risk for breathing problems. Follow instructions from your health care provider about wearing your sleep device: ? Anytime you are sleeping, including during daytime naps. ? While taking prescription pain medicines, sleeping medicines, or medicines that make you drowsy.  Avoid smoking.  Keep all follow-up visits as told by your health care provider. This is important. Contact a health care provider if:  You keep feeling nauseous or you keep vomiting.  You feel light-headed.  You are still sleepy or having trouble with balance after 24 hours.  You develop a rash.  You have a fever.  You have redness or swelling around the IV site. Get help right away if:  You have trouble breathing.  You have new-onset confusion at home. Summary  For several hours after your procedure, you may feel tired. You may also be forgetful and have poor judgment.  Have a responsible adult stay with you for the time you are told. It is important to have someone help care for you until you are awake and alert.  Rest as told. Do not drive or operate machinery. Do not drink alcohol or take sleeping pills.  Get help right away if you have trouble breathing, or if you suddenly become confused. This information is not intended to replace advice given to you by your health care provider. Make sure you discuss any questions you have with your health care provider. Document Revised: 02/10/2020 Document Reviewed: 04/29/2019 Elsevier Patient Education  2021 [employer].

## 2020-09-08 NOTE — Anesthesia Preprocedure Evaluation (Signed)
Anesthesia Evaluation  Patient identified by MRN, date of birth, ID band Patient awake    Reviewed: Allergy & Precautions, NPO status , Patient's Chart, lab work & pertinent test results  History of Anesthesia Complications (+) PONV and history of anesthetic complications  Airway Mallampati: II  TM Distance: >3 FB Neck ROM: Full    Dental  (+) Edentulous Upper, Edentulous Lower   Pulmonary former smoker,    Pulmonary exam normal breath sounds clear to auscultation       Cardiovascular Exercise Tolerance: Good + Valvular Problems/Murmurs  Rhythm:Regular Rate:Normal + Systolic murmurs    Neuro/Psych    GI/Hepatic GERD  Medicated and Controlled,(+) Hepatitis -  Endo/Other  Hypothyroidism   Renal/GU      Musculoskeletal  (+) Arthritis , Osteoarthritis,    Abdominal   Peds  Hematology   Anesthesia Other Findings   Reproductive/Obstetrics                            Anesthesia Physical Anesthesia Plan  ASA: III  Anesthesia Plan: General   Post-op Pain Management:    Induction: Intravenous  PONV Risk Score and Plan: Propofol infusion  Airway Management Planned: Nasal Cannula and Natural Airway  Additional Equipment:   Intra-op Plan:   Post-operative Plan:   Informed Consent: I have reviewed the patients History and Physical, chart, labs and discussed the procedure including the risks, benefits and alternatives for the proposed anesthesia with the patient or authorized representative who has indicated his/her understanding and acceptance.       Plan Discussed with: CRNA and Surgeon  Anesthesia Plan Comments:        Anesthesia Quick Evaluation

## 2020-09-08 NOTE — Transfer of Care (Signed)
Immediate Anesthesia Transfer of Care Note  Patient: Evan Miller  Procedure(s) Performed: ESOPHAGOGASTRODUODENOSCOPY (EGD) WITH PROPOFOL (N/A ) ESOPHAGEAL DILATION (N/A ) BIOPSY  Patient Location: PACU  Anesthesia Type:General  Level of Consciousness: awake  Airway & Oxygen Therapy: Patient Spontanous Breathing  Post-op Assessment: Report given to RN and Post -op Vital signs reviewed and stable  Post vital signs: Reviewed and stable  Last Vitals:  Vitals Value Taken Time  BP    Temp    Pulse    Resp    SpO2      Last Pain:  Vitals:   09/08/20 0933  PainSc: 0-No pain         Complications: No complications documented.

## 2020-09-08 NOTE — Op Note (Signed)
[facility] Patient Name: Evan Miller Procedure Date: 09/08/2020 9:11 AM MRN: [id number] Date of Birth: 1925-11-06 Attending MD: [name] ,  CSN: [id number] Age: 86 Admit Type: Outpatient Procedure:                Upper GI endoscopy Indications:              Dysphagia Providers:                [name], Lambert Mody Raphael Gibney,                            Technician Referring MD:              Medicines:                Monitored Anesthesia Care Complications:            No immediate complications. Estimated Blood Loss:     Estimated blood loss: none. Procedure:                Pre-Anesthesia Assessment:                           - Prior to the procedure, a History and Physical                            was performed, and patient medications, allergies                            and sensitivities were reviewed. The patient's                            tolerance of previous anesthesia was reviewed.                           - The risks and benefits of the procedure and the                            sedation options and risks were discussed with the                            patient. All questions were answered and informed                            consent was obtained.                           - ASA Grade Assessment: III - A patient with severe                            systemic disease.                           After obtaining informed consent, the endoscope was                            passed under direct vision. Throughout the  procedure, the patient's blood pressure, pulse, and                            oxygen saturations were monitored continuously. The                            [id number]-H190 ([id number]) scope was introduced through the                            mouth, and advanced to the second part of duodenum.                            The upper GI endoscopy was accomplished without                             difficulty. The patient tolerated the procedure                            well. Scope In: 9:35:32 AM Scope Out: 9:47:48 AM Total Procedure Duration: 0 hours 12 minutes 16 seconds  Findings:      A non-obstructing Schatzki ring was found at the gastroesophageal       junction. A TTS dilator was passed through the scope. Dilation with an       18-19-20 mm balloon dilator was performed to 20 mm; no mucosal       disruption was noted. Awas used to brek the rims of the ring succesfully.      The rest of the examined esophagus was normal. Biopsies were obtained       from the proximal and distal esophagus with cold forceps for histology       of suspected eosinophilic esophagitis.      The entire examined stomach was normal.      The examined duodenum was normal. Impression:               - Non-obstructing Schatzki ring. Dilated.                           - Normal rest of the esophagus. Biopsied.                           - Normal stomach.                           - Normal examined duodenum. Moderate Sedation:      Per Anesthesia Care Recommendation:           - Discharge patient to home (ambulatory).                           - Resume previous diet.                           - Await pathology results.                           - Continue present medications, including Prilosec  daily. Procedure Code(s):        --- Professional ---                           [phone], Esophagogastroduodenoscopy, flexible,                            transoral; with transendoscopic balloon dilation of                            esophagus (less than 30 mm diameter)                           [id number], 59, Esophagogastroduodenoscopy, flexible,                            transoral; with biopsy, single or multiple Diagnosis Code(s):        --- Professional ---                           K22.2, Esophageal obstruction                           R13.10, Dysphagia, unspecified CPT copyright 2019  American Medical Association. All rights reserved. The codes documented in this report are preliminary and upon coder review may  be revised to meet current compliance requirements. [name], MD [name],  09/08/2020 9:58:46 AM This report has been signed electronically. Number of Addenda: 0

## 2020-09-08 NOTE — Interval H&P Note (Signed)
History and Physical Interval Note:  09/08/2020 9:22 AM  Evan Miller is a 87 y.o. male with past medical history of hypothyroidism, arthritis, history of Schatzki's ring status post dilation and GERD, who presents for follow up of dysphagia.   The patient daughter says he is still having dysphagia and episodes of choking intermittently but usually this is only with specific type of food.  Denies any nausea, vomiting, fever, chills, abdominal distention, weight loss.  BP 137/75 (BP Location: Right Arm)   Pulse 69   Temp 98 F (36.7 C)   Resp 17   SpO2 98%  GENERAL: The patient is AO x3, in no acute distress. HEENT: Head is normocephalic and atraumatic. EOMI are intact. Mouth is well hydrated and without lesions. NECK: Supple. No masses LUNGS: Clear to auscultation. No presence of rhonchi/wheezing/rales. Adequate chest expansion HEART: RRR, normal s1 and s2. ABDOMEN: Soft, nontender, no guarding, no peritoneal signs, and nondistended. BS +. No masses. EXTREMITIES: Without any cyanosis, clubbing, rash, lesions or edema. NEUROLOGIC: AOx3, no focal motor deficit. SKIN: no jaundice, no rashes   Evan Miller  has presented today for surgery, with the diagnosis of esophageal stricture.  The various methods of treatment have been discussed with the patient and family. After consideration of risks, benefits and other options for treatment, the patient has consented to  Procedure(s) with comments: ESOPHAGOGASTRODUODENOSCOPY (EGD) WITH PROPOFOL (N/A) - am ESOPHAGEAL DILATION (N/A) as a surgical intervention.  The patient's history has been reviewed, patient examined, no change in status, stable for surgery.  I have reviewed the patient's chart and labs.  Questions were answered to the patient's satisfaction.     [name] Mayorga

## 2020-09-11 ENCOUNTER — Ambulatory Visit (INDEPENDENT_AMBULATORY_CARE_PROVIDER_SITE_OTHER): Payer: Medicare Other | Admitting: Internal Medicine

## 2020-09-11 LAB — SURGICAL PATHOLOGY

## 2020-09-14 ENCOUNTER — Encounter (HOSPITAL_COMMUNITY): Payer: Self-pay | Admitting: Gastroenterology

## 2020-09-16 ENCOUNTER — Other Ambulatory Visit (INDEPENDENT_AMBULATORY_CARE_PROVIDER_SITE_OTHER): Payer: Self-pay | Admitting: Internal Medicine

## 2020-09-16 DIAGNOSIS — E039 Hypothyroidism, unspecified: Secondary | ICD-10-CM

## 2020-09-25 ENCOUNTER — Other Ambulatory Visit (INDEPENDENT_AMBULATORY_CARE_PROVIDER_SITE_OTHER): Payer: Self-pay | Admitting: Internal Medicine

## 2020-09-25 DIAGNOSIS — E039 Hypothyroidism, unspecified: Secondary | ICD-10-CM

## 2020-09-25 MED ORDER — THYROID 120 MG PO TABS: ORAL_TABLET | ORAL | 3 refills | Status: DC

## 2020-09-25 NOTE — Telephone Encounter (Signed)
Dr. Anastasio Champion, please review and approve refill for NP thyroid as you feel appropriate.

## 2020-09-27 ENCOUNTER — Ambulatory Visit (INDEPENDENT_AMBULATORY_CARE_PROVIDER_SITE_OTHER): Payer: Medicare Other | Admitting: Internal Medicine

## 2020-09-28 ENCOUNTER — Ambulatory Visit (INDEPENDENT_AMBULATORY_CARE_PROVIDER_SITE_OTHER): Payer: Medicare Other | Admitting: Internal Medicine

## 2020-10-06 NOTE — Progress Notes (Signed)
Subjective:   Evan Miller is a 87 y.o. male who presents for an Initial Medicare Annual Wellness Visit.  Review of Systems    N/A  Cardiac Risk Factors include: male gender;advanced age (>54men, >33 women)     Objective:    Today's Vitals   10/09/20 0951  BP: 114/64  Pulse: 87  Temp: 97.6 F (36.4 C)  TempSrc: Temporal  SpO2: 97%  Weight: 176 lb 6 oz (80 kg)  Height: 5\' 5"  (1.651 m)   Body mass index is 29.35 kg/m.  Advanced Directives 10/09/2020 09/08/2020 09/06/2020 01/24/2020 09/27/2019 09/14/2018 12/29/2017  Does Patient Have a Medical Advance Directive? Yes No No No No Yes Yes  Type of 12/31/2017 of [place];Living will - - - - [place];Living will Healthcare Power of [place];Living will  Does patient want to make changes to medical advance directive? No - Patient declined - - - - No - Patient declined -  Copy of Healthcare Power of Attorney in Chart? No - copy requested - - - - No - copy requested No - copy requested  Would patient like information on creating a medical advance directive? - No - Patient declined No - Patient declined No - Patient declined - No - Patient declined -  Pre-existing out of facility DNR order (yellow form or pink MOST form) - - - - - - -    Current Medications (verified) Outpatient Encounter Medications as of 10/09/2020  Medication Sig  . aspirin 81 MG tablet Take 81 mg by mouth every morning.   . cholecalciferol (VITAMIN D3) 25 MCG (1000 UNIT) tablet Take 1,000 Units by mouth daily.  12/09/2020 omeprazole (PRILOSEC) 40 MG capsule Take 40 mg by mouth daily.  Marland Kitchen thyroid (NP THYROID) 120 MG tablet TAKE 1 TABLET(120 MG) BY MOUTH DAILY  . triamcinolone (KENALOG) 0.1 % Apply 1 application topically 2 (two) times daily. (Patient not taking: No sig reported)   No facility-administered encounter medications on file as of 10/09/2020.    Allergies (verified) Ciprofloxacin, Penicillins, and Sulfa antibiotics    History: Past Medical History:  Diagnosis Date  . Arthritis    back,shoulders and hips  . Cancer (HCC)    basal cell/ Melanoma 1997 left  shoulder/  PROSTATE  . GERD (gastroesophageal reflux disease)   . Hepatitis    age 9 years  . Hypothyroidism   . PONV (postoperative nausea and vomiting)   . Prostate CA [facility])    Past Surgical History:  Procedure Laterality Date  . BIOPSY  09/08/2020   Procedure: BIOPSY;  Surgeon: 11/08/2020, MD;  [facility];  Service: Gastroenterology;;  . CATARACT EXTRACTION W/PHACO  12/16/2011   Procedure: CATARACT EXTRACTION PHACO AND INTRAOCULAR LENS PLACEMENT (IOC);  Surgeon: 02/16/2012, MD;  Location: [facility];  Service: Ophthalmology;  Laterality: Left;  CDE:17.24   . CATARACT EXTRACTION W/PHACO  12/30/2011   Procedure: CATARACT EXTRACTION PHACO AND INTRAOCULAR LENS PLACEMENT (IOC);  Surgeon: 01/01/2012, MD;  Location: [facility];  Service: Ophthalmology;  Laterality: Right;  CDE 15.40  . COLONOSCOPY    . COLONOSCOPY N/A 08/07/2012   Procedure: COLONOSCOPY;  Surgeon: 08/09/2012, MD;  [facility];  Service: Endoscopy;  Laterality: N/A;  10:00  . COLONOSCOPY N/A 11/22/2015   Procedure: COLONOSCOPY;  Surgeon: 11/24/2015, MD;  [facility];  Service: Endoscopy;  Laterality: N/A;  12:00 - moved to 10:30 - Ann to notify  .  ESOPHAGEAL DILATION N/A 02/10/2015   Procedure: ESOPHAGEAL DILATION;  Surgeon: Rogene Houston, MD;  [facility];  Service: Endoscopy;  Laterality: N/A;  . ESOPHAGEAL DILATION N/A 03/29/2015   Procedure: ESOPHAGEAL DILATION;  Surgeon: Rogene Houston, MD;  [facility];  Service: Endoscopy;  Laterality: N/A;  . ESOPHAGEAL DILATION  11/22/2015   Procedure: ESOPHAGEAL DILATION;  Surgeon: Rogene Houston, MD;  [facility];  Service: Endoscopy;;  . ESOPHAGEAL DILATION N/A 11/19/2017   Procedure: ESOPHAGEAL DILATION;  Surgeon: Rogene Houston, MD;  [facility];  Service: Endoscopy;  Laterality: N/A;  . ESOPHAGEAL DILATION N/A 12/29/2017   Procedure: ESOPHAGEAL DILATION;  Surgeon: Rogene Houston, MD;  [facility];  Service: Endoscopy;  Laterality: N/A;  . ESOPHAGEAL DILATION N/A 09/08/2020   Procedure: ESOPHAGEAL DILATION;  Surgeon: Harvel Quale, MD;  [facility];  Service: Gastroenterology;  Laterality: N/A;  . ESOPHAGOGASTRODUODENOSCOPY N/A 02/10/2015   Procedure: ESOPHAGOGASTRODUODENOSCOPY (EGD);  Surgeon: Rogene Houston, MD;  [facility];  Service: Endoscopy;  Laterality: N/A;  925 - moved to 10:20 - Ann to notify pt  . ESOPHAGOGASTRODUODENOSCOPY N/A 03/29/2015   Procedure: ESOPHAGOGASTRODUODENOSCOPY (EGD);  Surgeon: Rogene Houston, MD;  [facility];  Service: Endoscopy;  Laterality: N/A;  145 - moved to 8:30 - Ann notified pt  . ESOPHAGOGASTRODUODENOSCOPY N/A 11/22/2015   Procedure: ESOPHAGOGASTRODUODENOSCOPY (EGD);  Surgeon: Rogene Houston, MD;  [facility];  Service: Endoscopy;  Laterality: N/A;  . ESOPHAGOGASTRODUODENOSCOPY N/A 10/27/2017   Procedure: ESOPHAGOGASTRODUODENOSCOPY (EGD);  Surgeon: Daneil Dolin, MD;  [facility];  Service: Endoscopy;  Laterality: N/A;  removal of food impaction  . ESOPHAGOGASTRODUODENOSCOPY N/A 11/19/2017   Procedure: ESOPHAGOGASTRODUODENOSCOPY (EGD);  Surgeon: Rogene Houston, MD;  [facility];  Service: Endoscopy;  Laterality: N/A;  7:30  . ESOPHAGOGASTRODUODENOSCOPY N/A 12/29/2017   Procedure: ESOPHAGOGASTRODUODENOSCOPY (EGD);  Surgeon: Rogene Houston, MD;  [facility];  Service: Endoscopy;  Laterality: N/A;  730  . ESOPHAGOGASTRODUODENOSCOPY (EGD) WITH PROPOFOL N/A 09/08/2020   Procedure: ESOPHAGOGASTRODUODENOSCOPY (EGD) WITH PROPOFOL;  Surgeon: Harvel Quale, MD;  [facility];  Service: Gastroenterology;  Laterality: N/A;  am  . HERNIA REPAIR    . HYDROCELE EXCISION      bilateral  . JOINT REPLACEMENT     left knee 9/12  . MELANOMA EXCISION N/A 06/11/2017   Procedure: WIDE LOCAL EXCISION WITH ADVANCEMENT FLAP CLOSURE LEFT NECK MELANOMA;  Surgeon: Stark Klein, MD;  Location: [place];  Service: General;  Laterality: N/A;  GENERAL AND LOCAL  . TOTAL KNEE ARTHROPLASTY  06/24/2011   Procedure: TOTAL KNEE ARTHROPLASTY;  Surgeon: Gearlean Alf;  [facility];  Service: Orthopedics;  Laterality: Right;   Family History  Problem Relation Age of Onset  . Breast cancer Mother   . Prostate cancer Brother   . Nephritis Brother   . Rectal cancer Brother   . Cirrhosis Brother   . Liver cancer Brother   . Healthy Daughter   . Healthy Son    Social History   Socioeconomic History  . Marital status: Married    Spouse name: Not on file  . Number of children: Not on file  . Years of education: Not on file  . Highest education level: Not on file  Occupational History  . Not on file  Tobacco Use  . Smoking status: Former  Smoker    Packs/day: 1.00    Years: 30.00    Pack years: 30.00    Types: Cigarettes    Quit date: 06/16/1962    Years since quitting: 58.3  . Smokeless tobacco: Former Systems developer    Types: [place] date: 06/16/2006  Vaping Use  . Vaping Use: Never used  Substance and Sexual Activity  . Alcohol use: No    Comment: quit  60 yrs ago  . Drug use: No  . Sexual activity: Yes    Birth control/protection: None  Other Topics Concern  . Not on file  Social History Narrative  . Not on file   Social Determinants of Health   Financial Resource Strain: Low Risk   . Difficulty of Paying Living Expenses: Not hard at all  Food Insecurity: No Food Insecurity  . Worried About Charity fundraiser in the Last Year: Never true  . Ran Out of Food in the Last Year: Never true  Transportation Needs: No Transportation Needs  . Lack of Transportation (Medical): No  . Lack of Transportation (Non-Medical): No  Physical Activity:  Inactive  . Days of Exercise per Week: 0 days  . Minutes of Exercise per Session: 0 min  Stress: No Stress Concern Present  . Feeling of Stress : Not at all  Social Connections: Moderately Isolated  . Frequency of Communication with Friends and Family: More than three times a week  . Frequency of Social Gatherings with Friends and Family: More than three times a week  . Attends Religious Services: More than 4 times per year  . Active Member of Clubs or Organizations: No  . Attends Archivist Meetings: Never  . Marital Status: Widowed    Tobacco Counseling Counseling given: Not Answered   Clinical Intake:  Pre-visit preparation completed: Yes        Nutritional Risks: None Diabetes: No  How often do you need to have someone help you when you read instructions, pamphlets, or other written materials from your doctor or pharmacy?: 1 - Never  Diabetic?No   Interpreter Needed?: No  Information entered by :: [place] of Daily Living In your present state of health, do you have any difficulty performing the following activities: 10/09/2020 09/06/2020  Hearing? Tempie Donning  Comment has hearing aids but does not wear them -  Vision? N N  Difficulty concentrating or making decisions? Y N  Walking or climbing stairs? N Y  Comment - unsteady gait  Dressing or bathing? N N  Doing errands, shopping? Y N  Preparing Food and eating ? N -  Using the Toilet? N -  In the past six months, have you accidently leaked urine? N -  Do you have problems with loss of bowel control? N -  Managing your Medications? N -  Managing your Finances? N -  Housekeeping or managing your Housekeeping? N -  Some recent data might be hidden    Patient Care Team: Doree Albee, MD as PCP - General (Internal Medicine)  Indicate any recent Medical Services you may have received from other than [facility] providers in the past year (date may be approximate).     Assessment:   This is a  routine wellness examination for [place].  Hearing/Vision screen  Hearing Screening   125Hz  250Hz  500Hz  1000Hz  2000Hz  3000Hz  4000Hz  6000Hz  8000Hz   Right ear:           Left ear:  Vision Screening Comments: Patient has not had an eye exam in several years. Currently wears reading glasses   Dietary issues and exercise activities discussed: Current Exercise Habits: The patient does not participate in regular exercise at present  Goals    . Exercise 3x per week (30 min per time)    . Prevent falls      Depression Screen PHQ 2/9 Scores 10/09/2020 05/02/2020 08/12/2019  PHQ - 2 Score 0 0 0  PHQ- 9 Score - 0 -  Exception Documentation - - Medical reason    Fall Risk Fall Risk  10/09/2020 05/02/2020 08/12/2019  Falls in the past year? 0 1 0  Number falls in past yr: 0 1 0  Injury with Fall? 0 0 0  Risk for fall due to : Impaired balance/gait - No Fall Risks  Follow up Falls evaluation completed;Falls prevention discussed - Falls evaluation completed    FALL RISK PREVENTION PERTAINING TO THE HOME:  Any stairs in or around the home? Yes  If so, are there any without handrails? No  Home free of loose throw rugs in walkways, pet beds, electrical cords, etc? Yes  Adequate lighting in your home to reduce risk of falls? Yes   ASSISTIVE DEVICES UTILIZED TO PREVENT FALLS:  Life alert? Yes  Use of a cane, walker or w/c? No  Grab bars in the bathroom? Yes  Shower chair or bench in shower? Yes  Elevated toilet seat or a handicapped toilet? Yes   TIMED UP AND GO:  Was the test performed? Yes .  Length of time to ambulate 10 feet: 5 sec.   Gait slow and steady without use of assistive device  Cognitive Function: MMSE - Mini Mental State Exam 08/31/2020  Orientation to time 3  Orientation to Place 4  Registration 2  Attention/ Calculation 5  Recall 2  Language- name 2 objects 2  Language- repeat 1  Language- follow 3 step command 3  Language- read & follow direction 1  Write  a sentence 0  Copy design 0  Total score 23     6CIT Screen 10/09/2020  What Year? 4 points  What month? 0 points  What time? 0 points  Count back from 20 0 points  Months in reverse 0 points  Repeat phrase 0 points  Total Score 4    Immunizations Immunization History  Administered Date(s) Administered  . Fluad Quad(high Dose 65+) 04/10/2019, 03/16/2020  . Moderna Sars-Covid-2 Vaccination 08/12/2019, 09/09/2019  . Pneumococcal Conjugate-13 05/02/2020  . Tdap 09/14/2018    TDAP status: Up to date  Flu Vaccine status: Up to date  Pneumococcal vaccine status: Up to date  Covid-19 vaccine status: Completed vaccines  Qualifies for Shingles Vaccine? Yes   Zostavax completed No   Shingrix Completed?: No.    Education has been provided regarding the importance of this vaccine. Patient has been advised to call insurance company to determine out of pocket expense if they have not yet received this vaccine. Advised may also receive vaccine at local pharmacy or Health Dept. Verbalized acceptance and understanding.  Screening Tests Health Maintenance  Topic Date Due  . COVID-19 Vaccine (3 - Booster for Moderna series) 03/10/2020  . INFLUENZA VACCINE  01/08/2021  . PNA vac Low Risk Adult (2 of 2 - PPSV23) 05/02/2021  . TETANUS/TDAP  09/13/2028  . HPV VACCINES  Aged Out    Health Maintenance  Health Maintenance Due  Topic Date Due  . COVID-19 Vaccine (3 - Booster for  Moderna series) 03/10/2020    Colorectal cancer screening: No longer required.   Lung Cancer Screening: (Low Dose CT Chest recommended if Age 39-80 years, 30 pack-year currently smoking OR have quit w/in 15years.) does not qualify.   Lung Cancer Screening Referral: N/A   Additional Screening:  Hepatitis C Screening: does not qualify;   Vision Screening: Recommended annual ophthalmology exams for early detection of glaucoma and other disorders of the eye. Is the patient up to date with their annual eye exam?   No  Who is the provider or what is the name of the office in which the patient attends annual eye exams? Does not have eye care doctor currently  If pt is not established with a provider, would they like to be referred to a provider to establish care? No .   Dental Screening: Recommended annual dental exams for proper oral hygiene  Community Resource Referral / Chronic Care Management: CRR required this visit?  No   CCM required this visit?  No      Plan:     I have personally reviewed and noted the following in the patient's chart:   . Medical and social history . Use of alcohol, tobacco or illicit drugs  . Current medications and supplements . Functional ability and status . Nutritional status . Physical activity . Advanced directives . List of other physicians . Hospitalizations, surgeries, and ER visits in previous 12 months . Vitals . Screenings to include cognitive, depression, and falls . Referrals and appointments  In addition, I have reviewed and discussed with patient certain preventive protocols, quality metrics, and best practice recommendations. A written personalized care plan for preventive services as well as general preventive health recommendations were provided to patient.     Theodora Blow, LPN   09/08/9377   Nurse Notes: None

## 2020-10-09 ENCOUNTER — Ambulatory Visit (INDEPENDENT_AMBULATORY_CARE_PROVIDER_SITE_OTHER): Payer: Medicare Other

## 2020-10-09 ENCOUNTER — Encounter (INDEPENDENT_AMBULATORY_CARE_PROVIDER_SITE_OTHER): Payer: Self-pay

## 2020-10-09 ENCOUNTER — Other Ambulatory Visit: Payer: Self-pay

## 2020-10-09 VITALS — BP 114/64 | HR 87 | Temp 97.6°F | Ht 65.0 in | Wt 176.4 lb

## 2020-10-09 DIAGNOSIS — Z0000 Encounter for general adult medical examination without abnormal findings: Secondary | ICD-10-CM

## 2020-10-09 NOTE — Patient Instructions (Signed)
Evan Miller , Thank you for taking time to come for your Medicare Wellness Visit. I appreciate your ongoing commitment to your health goals. Please review the following plan we discussed and let me know if I can assist you in the future.   Screening recommendations/referrals: Colonoscopy: No longer required  Recommended yearly ophthalmology/optometry visit for glaucoma screening and checkup Recommended yearly dental visit for hygiene and checkup  Vaccinations: Influenza vaccine: Up to date, next due fall 2022  Pneumococcal vaccine: Up to date, 05/02/2021 Tdap vaccine: Up to date, next due 09/13/2028 Shingles vaccine: Currently due for Shingrix, if you would like to receive we recommend that you do so at your local pharmacy.    Advanced directives: Please bring copies of your advanced medical directives so that we may scan them into your chart.  Conditions/risks identified: None   Next appointment: 10/15/2021 @ 9:45 am with [place] 65 Years and Older, Male Preventive care refers to lifestyle choices and visits with your health care provider that can promote health and wellness. What does preventive care include?  A yearly physical exam. This is also called an annual well check.  Dental exams once or twice a year.  Routine eye exams. Ask your health care provider how often you should have your eyes checked.  Personal lifestyle choices, including:  Daily care of your teeth and gums.  Regular physical activity.  Eating a healthy diet.  Avoiding tobacco and drug use.  Limiting alcohol use.  Practicing safe sex.  Taking low doses of aspirin every day.  Taking vitamin and mineral supplements as recommended by your health care provider. What happens during an annual well check? The services and screenings done by your health care provider during your annual well check will depend on your age, overall health, lifestyle risk factors, and family  history of disease. Counseling  Your health care provider may ask you questions about your:  Alcohol use.  Tobacco use.  Drug use.  Emotional well-being.  Home and relationship well-being.  Sexual activity.  Eating habits.  History of falls.  Memory and ability to understand (cognition).  Work and work Statistician. Screening  You may have the following tests or measurements:  Height, weight, and BMI.  Blood pressure.  Lipid and cholesterol levels. These may be checked every 5 years, or more frequently if you are over 65 years old.  Skin check.  Lung cancer screening. You may have this screening every year starting at age 30 if you have a 30-pack-year history of smoking and currently smoke or have quit within the past 15 years.  Fecal occult blood test (FOBT) of the stool. You may have this test every year starting at age 27.  Flexible sigmoidoscopy or colonoscopy. You may have a sigmoidoscopy every 5 years or a colonoscopy every 10 years starting at age 61.  Prostate cancer screening. Recommendations will vary depending on your family history and other risks.  Hepatitis C blood test.  Hepatitis B blood test.  Sexually transmitted disease (STD) testing.  Diabetes screening. This is done by checking your blood sugar (glucose) after you have not eaten for a while (fasting). You may have this done every 1-3 years.  Abdominal aortic aneurysm (AAA) screening. You may need this if you are a current or former smoker.  Osteoporosis. You may be screened starting at age 56 if you are at high risk. Talk with your health care provider about your test results, treatment options, and if necessary,  the need for more tests. Vaccines  Your health care provider may recommend certain vaccines, such as:  Influenza vaccine. This is recommended every year.  Tetanus, diphtheria, and acellular pertussis (Tdap, Td) vaccine. You may need a Td booster every 10 years.  Zoster vaccine.  You may need this after age 84.  Pneumococcal 13-valent conjugate (PCV13) vaccine. One dose is recommended after age 69.  Pneumococcal polysaccharide (PPSV23) vaccine. One dose is recommended after age 97. Talk to your health care provider about which screenings and vaccines you need and how often you need them. This information is not intended to replace advice given to you by your health care provider. Make sure you discuss any questions you have with your health care provider. Document Released: 06/23/2015 Document Revised: 02/14/2016 Document Reviewed: 03/28/2015 Elsevier Interactive Patient Education  2017 [place] Prevention in the Home Falls can cause injuries. They can happen to people of all ages. There are many things you can do to make your home safe and to help prevent falls. What can I do on the outside of my home?  Regularly fix the edges of walkways and driveways and fix any cracks.  Remove anything that might make you trip as you walk through a door, such as a raised step or threshold.  Trim any bushes or trees on the path to your home.  Use bright outdoor lighting.  Clear any walking paths of anything that might make someone trip, such as rocks or tools.  Regularly check to see if handrails are loose or broken. Make sure that both sides of any steps have handrails.  Any raised decks and porches should have guardrails on the edges.  Have any leaves, snow, or ice cleared regularly.  Use sand or salt on walking paths during winter.  Clean up any spills in your garage right away. This includes oil or grease spills. What can I do in the bathroom?  Use night lights.  Install grab bars by the toilet and in the tub and shower. Do not use towel bars as grab bars.  Use non-skid mats or decals in the tub or shower.  If you need to sit down in the shower, use a plastic, non-slip stool.  Keep the floor dry. Clean up any water that spills on the floor as soon  as it happens.  Remove soap buildup in the tub or shower regularly.  Attach bath mats securely with double-sided non-slip rug tape.  Do not have throw rugs and other things on the floor that can make you trip. What can I do in the bedroom?  Use night lights.  Make sure that you have a light by your bed that is easy to reach.  Do not use any sheets or blankets that are too big for your bed. They should not hang down onto the floor.  Have a firm chair that has side arms. You can use this for support while you get dressed.  Do not have throw rugs and other things on the floor that can make you trip. What can I do in the kitchen?  Clean up any spills right away.  Avoid walking on wet floors.  Keep items that you use a lot in easy-to-reach places.  If you need to reach something above you, use a strong step stool that has a grab bar.  Keep electrical cords out of the way.  Do not use floor polish or wax that makes floors slippery. If you must use  wax, use non-skid floor wax.  Do not have throw rugs and other things on the floor that can make you trip. What can I do with my stairs?  Do not leave any items on the stairs.  Make sure that there are handrails on both sides of the stairs and use them. Fix handrails that are broken or loose. Make sure that handrails are as long as the stairways.  Check any carpeting to make sure that it is firmly attached to the stairs. Fix any carpet that is loose or worn.  Avoid having throw rugs at the top or bottom of the stairs. If you do have throw rugs, attach them to the floor with carpet tape.  Make sure that you have a light switch at the top of the stairs and the bottom of the stairs. If you do not have them, ask someone to add them for you. What else can I do to help prevent falls?  Wear shoes that:  Do not have high heels.  Have rubber bottoms.  Are comfortable and fit you well.  Are closed at the toe. Do not wear sandals.  If  you use a stepladder:  Make sure that it is fully opened. Do not climb a closed stepladder.  Make sure that both sides of the stepladder are locked into place.  Ask someone to hold it for you, if possible.  Clearly mark and make sure that you can see:  Any grab bars or handrails.  First and last steps.  Where the edge of each step is.  Use tools that help you move around (mobility aids) if they are needed. These include:  Canes.  Walkers.  Scooters.  Crutches.  Turn on the lights when you go into a dark area. Replace any light bulbs as soon as they burn out.  Set up your furniture so you have a clear path. Avoid moving your furniture around.  If any of your floors are uneven, fix them.  If there are any pets around you, be aware of where they are.  Review your medicines with your doctor. Some medicines can make you feel dizzy. This can increase your chance of falling. Ask your doctor what other things that you can do to help prevent falls. This information is not intended to replace advice given to you by your health care provider. Make sure you discuss any questions you have with your health care provider. Document Released: 03/23/2009 Document Revised: 11/02/2015 Document Reviewed: 07/01/2014 Elsevier Interactive Patient Education  2017 [employer].

## 2020-11-02 ENCOUNTER — Ambulatory Visit (INDEPENDENT_AMBULATORY_CARE_PROVIDER_SITE_OTHER): Payer: Medicare Other | Admitting: Internal Medicine

## 2020-11-02 ENCOUNTER — Other Ambulatory Visit: Payer: Self-pay

## 2020-11-02 ENCOUNTER — Encounter (INDEPENDENT_AMBULATORY_CARE_PROVIDER_SITE_OTHER): Payer: Self-pay | Admitting: Internal Medicine

## 2020-11-02 VITALS — BP 118/64 | HR 66 | Temp 97.3°F | Resp 18 | Ht 61.0 in | Wt 173.0 lb

## 2020-11-02 DIAGNOSIS — E039 Hypothyroidism, unspecified: Secondary | ICD-10-CM

## 2020-11-02 DIAGNOSIS — R5383 Other fatigue: Secondary | ICD-10-CM

## 2020-11-02 DIAGNOSIS — R5381 Other malaise: Secondary | ICD-10-CM

## 2020-11-02 DIAGNOSIS — E559 Vitamin D deficiency, unspecified: Secondary | ICD-10-CM

## 2020-11-02 MED ORDER — NP THYROID 120 MG PO TABS: ORAL_TABLET | ORAL | 3 refills | Status: DC

## 2020-11-02 NOTE — Progress Notes (Signed)
Metrics: Intervention Frequency ACO  Documented Smoking Status Yearly  Screened one or more times in 24 months  Cessation Counseling or  Active cessation medication Past 24 months  Past 24 months   Guideline developer: UpToDate (See UpToDate for funding source) Date Released: 2014       Wellness Office Visit  Subjective:  Patient ID: Evan Miller, male    DOB: 04/11/1926  Age: 87 y.o. MRN: [id number]  CC: This man comes in for follow-up of hypothyroidism.  Unfortunately, he ran out of his NP thyroid and has been taking levothyroxine. HPI  They are not happy with the current pharmacy and they want to change it to [facility]. Past Medical History:  Diagnosis Date  . Arthritis    back,shoulders and hips  . Cancer ([place])    basal cell/ Melanoma 1997 left  shoulder/  PROSTATE  . GERD (gastroesophageal reflux disease)   . Hepatitis    age 44 years  . Hypothyroidism   . PONV (postoperative nausea and vomiting)   . Prostate CA [facility])    Past Surgical History:  Procedure Laterality Date  . BIOPSY  09/08/2020   Procedure: BIOPSY;  Surgeon: Harvel Quale, MD;  [facility];  Service: Gastroenterology;;  . CATARACT EXTRACTION W/PHACO  12/16/2011   Procedure: CATARACT EXTRACTION PHACO AND INTRAOCULAR LENS PLACEMENT ([place]);  Surgeon: Tonny Branch, MD;  Location: [facility];  Service: Ophthalmology;  Laterality: Left;  CDE:17.24   . CATARACT EXTRACTION W/PHACO  12/30/2011   Procedure: CATARACT EXTRACTION PHACO AND INTRAOCULAR LENS PLACEMENT ([facility]);  Surgeon: Tonny Branch, MD;  Location: [facility];  Service: Ophthalmology;  Laterality: Right;  CDE 15.40  . COLONOSCOPY    . COLONOSCOPY N/A 08/07/2012   Procedure: COLONOSCOPY;  Surgeon: Rogene Houston, MD;  [facility];  Service: Endoscopy;  Laterality: N/A;  10:00  . COLONOSCOPY N/A 11/22/2015   Procedure: COLONOSCOPY;  Surgeon: Rogene Houston, MD;  [facility];  Service: Endoscopy;  Laterality: N/A;   12:00 - moved to 10:30 - Ann to notify  . ESOPHAGEAL DILATION N/A 02/10/2015   Procedure: ESOPHAGEAL DILATION;  Surgeon: Rogene Houston, MD;  [facility];  Service: Endoscopy;  Laterality: N/A;  . ESOPHAGEAL DILATION N/A 03/29/2015   Procedure: ESOPHAGEAL DILATION;  Surgeon: Rogene Houston, MD;  [facility];  Service: Endoscopy;  Laterality: N/A;  . ESOPHAGEAL DILATION  11/22/2015   Procedure: ESOPHAGEAL DILATION;  Surgeon: Rogene Houston, MD;  [facility];  Service: Endoscopy;;  . ESOPHAGEAL DILATION N/A 11/19/2017   Procedure: ESOPHAGEAL DILATION;  Surgeon: Rogene Houston, MD;  [facility];  Service: Endoscopy;  Laterality: N/A;  . ESOPHAGEAL DILATION N/A 12/29/2017   Procedure: ESOPHAGEAL DILATION;  Surgeon: Rogene Houston, MD;  [facility];  Service: Endoscopy;  Laterality: N/A;  . ESOPHAGEAL DILATION N/A 09/08/2020   Procedure: ESOPHAGEAL DILATION;  Surgeon: Harvel Quale, MD;  [facility];  Service: Gastroenterology;  Laterality: N/A;  . ESOPHAGOGASTRODUODENOSCOPY N/A 02/10/2015   Procedure: ESOPHAGOGASTRODUODENOSCOPY (EGD);  Surgeon: Rogene Houston, MD;  [facility];  Service: Endoscopy;  Laterality: N/A;  925 - moved to 10:20 - Ann to notify pt  . ESOPHAGOGASTRODUODENOSCOPY N/A 03/29/2015   Procedure: ESOPHAGOGASTRODUODENOSCOPY (EGD);  Surgeon: Rogene Houston, MD;  [facility];  Service: Endoscopy;  Laterality: N/A;  145 - moved to 8:30 - Ann notified pt  . ESOPHAGOGASTRODUODENOSCOPY  N/A 11/22/2015   Procedure: ESOPHAGOGASTRODUODENOSCOPY (EGD);  Surgeon: Rogene Houston, MD;  [facility];  Service: Endoscopy;  Laterality: N/A;  . ESOPHAGOGASTRODUODENOSCOPY N/A 10/27/2017   Procedure: ESOPHAGOGASTRODUODENOSCOPY (EGD);  Surgeon: Daneil Dolin, MD;  [facility];  Service: Endoscopy;  Laterality: N/A;  removal of food impaction  . ESOPHAGOGASTRODUODENOSCOPY N/A  11/19/2017   Procedure: ESOPHAGOGASTRODUODENOSCOPY (EGD);  Surgeon: Rogene Houston, MD;  [facility];  Service: Endoscopy;  Laterality: N/A;  7:30  . ESOPHAGOGASTRODUODENOSCOPY N/A 12/29/2017   Procedure: ESOPHAGOGASTRODUODENOSCOPY (EGD);  Surgeon: Rogene Houston, MD;  [facility];  Service: Endoscopy;  Laterality: N/A;  730  . ESOPHAGOGASTRODUODENOSCOPY (EGD) WITH PROPOFOL N/A 09/08/2020   Procedure: ESOPHAGOGASTRODUODENOSCOPY (EGD) WITH PROPOFOL;  Surgeon: Harvel Quale, MD;  [facility];  Service: Gastroenterology;  Laterality: N/A;  am  . HERNIA REPAIR    . HYDROCELE EXCISION     bilateral  . JOINT REPLACEMENT     left knee 9/12  . MELANOMA EXCISION N/A 06/11/2017   Procedure: WIDE LOCAL EXCISION WITH ADVANCEMENT FLAP CLOSURE LEFT NECK MELANOMA;  Surgeon: Stark Klein, MD;  Location: [place];  Service: General;  Laterality: N/A;  GENERAL AND LOCAL  . TOTAL KNEE ARTHROPLASTY  06/24/2011   Procedure: TOTAL KNEE ARTHROPLASTY;  Surgeon: Gearlean Alf;  Location[facility];  Service: Orthopedics;  Laterality: Right;     Family History  Problem Relation Age of Onset  . Breast cancer Mother   . Prostate cancer Brother   . Nephritis Brother   . Rectal cancer Brother   . Cirrhosis Brother   . Liver cancer Brother   . Healthy Daughter   . Healthy Son     Social History   Social History Narrative  . Not on file   Social History   Tobacco Use  . Smoking status: Former Smoker    Packs/day: 1.00    Years: 30.00    Pack years: 30.00    Types: Cigarettes    Quit date: 06/16/1962    Years since quitting: 58.4  . Smokeless tobacco: Former Systems developer    Types: [place] date: 06/16/2006  Substance Use Topics  . Alcohol use: No    Comment: quit  60 yrs ago    Current Meds  Medication Sig  . aspirin 81 MG tablet Take 81 mg by mouth every morning.   . cholecalciferol (VITAMIN D3) 25 MCG (1000 UNIT) tablet Take 1,000 Units  by mouth daily.  Marland Kitchen omeprazole (PRILOSEC) 40 MG capsule Take 40 mg by mouth daily.  Marland Kitchen triamcinolone (KENALOG) 0.1 % Apply 1 application topically 2 (two) times daily.  . [DISCONTINUED] thyroid (NP THYROID) 120 MG tablet TAKE 1 TABLET(120 MG) BY MOUTH DAILY     Flowsheet Row Office Visit from 05/02/2020 in [facility]  PHQ-9 Total Score 0      Objective:   Today's Vitals: BP 118/64 (BP Location: Right Arm, Patient Position: Sitting, Cuff Size: Normal)   Pulse 66   Temp (!) 97.3 F (36.3 C) (Temporal)   Resp 18   Ht 5\' 1"  (1.549 m)   Wt 173 lb (78.5 kg)   SpO2 93%   BMI 32.69 kg/m  Vitals with BMI 11/02/2020 10/09/2020 09/08/2020  Height 5\' 1"  5\' 5"  -  Weight 173 lbs 176 lbs 6 oz -  BMI 95.1 88.41 -  Systolic 660 630 92  Diastolic 64 64 58  Pulse  66 87 58     Physical Exam  His weight is stable, has lost about 3 pounds.  Blood pressure is good.  He appears to be alert.  He is hard of hearing.     Assessment   1. Hypothyroidism, unspecified type   2. Malaise and fatigue   3. Vitamin D deficiency disease       Tests ordered No orders of the defined types were placed in this encounter.    Plan: 1. I have refilled his NP thyroid to go to [facility]. 2. Follow-up in about 6 weeks and we will check blood work then.   Meds ordered this encounter  Medications  . NP THYROID 120 MG tablet    Sig: TAKE 1 TABLET(120 MG) BY MOUTH DAILY    Dispense:  30 tablet    Refill:  3    ZERO refills remain on this prescription. Your patient is requesting advance approval of refills for this medication to PREVENT ANY MISSED DOSES    Isel Skufca Luther Parody, MD

## 2020-11-02 NOTE — Progress Notes (Signed)
Pt run out . They gave  Him the 50 mg/ unitl they seen you. Stanton Kidney gave what she had remaining. The [facility] was horrible on his refills now. So he will be be using the [facility]

## 2020-12-25 ENCOUNTER — Encounter (INDEPENDENT_AMBULATORY_CARE_PROVIDER_SITE_OTHER): Payer: Self-pay | Admitting: Internal Medicine

## 2020-12-25 ENCOUNTER — Other Ambulatory Visit: Payer: Self-pay

## 2020-12-25 ENCOUNTER — Ambulatory Visit (INDEPENDENT_AMBULATORY_CARE_PROVIDER_SITE_OTHER): Payer: Medicare Other | Admitting: Internal Medicine

## 2020-12-25 VITALS — BP 130/68 | HR 71 | Temp 97.3°F | Ht 61.0 in | Wt 180.0 lb

## 2020-12-25 DIAGNOSIS — R5383 Other fatigue: Secondary | ICD-10-CM

## 2020-12-25 DIAGNOSIS — E039 Hypothyroidism, unspecified: Secondary | ICD-10-CM | POA: Diagnosis not present

## 2020-12-25 DIAGNOSIS — R413 Other amnesia: Secondary | ICD-10-CM | POA: Diagnosis not present

## 2020-12-25 DIAGNOSIS — R5381 Other malaise: Secondary | ICD-10-CM

## 2020-12-25 DIAGNOSIS — E559 Vitamin D deficiency, unspecified: Secondary | ICD-10-CM

## 2020-12-25 NOTE — Progress Notes (Signed)
Metrics: Intervention Frequency ACO  Documented Smoking Status Yearly  Screened one or more times in 24 months  Cessation Counseling or  Active cessation medication Past 24 months  Past 24 months   Guideline developer: UpToDate (See UpToDate for funding source) Date Released: 2014       Wellness Office Visit  Subjective:  Patient ID: Evan Miller, male    DOB: 1926-02-14  Age: 87 y.o. MRN: [id number]  CC: This man comes in for follow-up of hypothyroidism, fatigue, vitamin D deficiency. HPI  His son is with him.  On the last visit, I put him back on NP thyroid but the son says that he is not sure that he is taking it every morning like he should.  The patient's daughter lives with him and the son is not convinced that the daughter is giving him the medication every morning as prescribed. The son is also concerned about progressive memory loss and with a history of prostate cancer, there is concern regarding metastatic prostate cancer to the brain.  He has not had any falls, there is no history of slurred speech or limb weakness. Past Medical History:  Diagnosis Date   Arthritis    back,shoulders and hips   Cancer ([place])    basal cell/ Melanoma 1997 left  shoulder/  PROSTATE   GERD (gastroesophageal reflux disease)    Hepatitis    age 83 years   Hypothyroidism    PONV (postoperative nausea and vomiting)    Prostate CA [facility])    Past Surgical History:  Procedure Laterality Date   BIOPSY  09/08/2020   Procedure: BIOPSY;  Surgeon: Harvel Quale, MD;  [facility];  Service: Gastroenterology;;   CATARACT EXTRACTION W/PHACO  12/16/2011   Procedure: CATARACT EXTRACTION PHACO AND INTRAOCULAR LENS PLACEMENT ([place]);  Surgeon: Tonny Branch, MD;  [facility];  Service: Ophthalmology;  Laterality: Left;  CDE:17.24    CATARACT EXTRACTION W/PHACO  12/30/2011   Procedure: CATARACT EXTRACTION PHACO AND INTRAOCULAR LENS PLACEMENT ([facility]);  Surgeon: Tonny Branch, MD;  Location:  [facility];  Service: Ophthalmology;  Laterality: Right;  CDE 15.40   COLONOSCOPY     COLONOSCOPY N/A 08/07/2012   Procedure: COLONOSCOPY;  Surgeon: Rogene Houston, MD;  [facility];  Service: Endoscopy;  Laterality: N/A;  10:00   COLONOSCOPY N/A 11/22/2015   Procedure: COLONOSCOPY;  Surgeon: Rogene Houston, MD;  [facility];  Service: Endoscopy;  Laterality: N/A;  12:00 - moved to 10:30 - Ann to notify   ESOPHAGEAL DILATION N/A 02/10/2015   Procedure: ESOPHAGEAL DILATION;  Surgeon: Rogene Houston, MD;  [facility];  Service: Endoscopy;  Laterality: N/A;   ESOPHAGEAL DILATION N/A 03/29/2015   Procedure: ESOPHAGEAL DILATION;  Surgeon: Rogene Houston, MD;  [facility];  Service: Endoscopy;  Laterality: N/A;   ESOPHAGEAL DILATION  11/22/2015   Procedure: ESOPHAGEAL DILATION;  Surgeon: Rogene Houston, MD;  [facility];  Service: Endoscopy;;   ESOPHAGEAL DILATION N/A 11/19/2017   Procedure: ESOPHAGEAL DILATION;  Surgeon: Rogene Houston, MD;  [facility];  Service: Endoscopy;  Laterality: N/A;   ESOPHAGEAL DILATION N/A 12/29/2017   Procedure: ESOPHAGEAL DILATION;  Surgeon: Rogene Houston, MD;  [facility];  Service: Endoscopy;  Laterality: N/A;   ESOPHAGEAL DILATION N/A 09/08/2020   Procedure: ESOPHAGEAL DILATION;  Surgeon: Harvel Quale, MD;  [facility];  Service: Gastroenterology;  Laterality: N/A;  ESOPHAGOGASTRODUODENOSCOPY N/A 02/10/2015   Procedure: ESOPHAGOGASTRODUODENOSCOPY (EGD);  Surgeon: Rogene Houston, MD;  [facility];  Service: Endoscopy;  Laterality: N/A;  925 - moved to 10:20 - Ann to notify pt   ESOPHAGOGASTRODUODENOSCOPY N/A 03/29/2015   Procedure: ESOPHAGOGASTRODUODENOSCOPY (EGD);  Surgeon: Rogene Houston, MD;  [facility];  Service: Endoscopy;  Laterality: N/A;  145 - moved to 8:30 - Ann notified pt   ESOPHAGOGASTRODUODENOSCOPY N/A 11/22/2015    Procedure: ESOPHAGOGASTRODUODENOSCOPY (EGD);  Surgeon: Rogene Houston, MD;  [facility];  Service: Endoscopy;  Laterality: N/A;   ESOPHAGOGASTRODUODENOSCOPY N/A 10/27/2017   Procedure: ESOPHAGOGASTRODUODENOSCOPY (EGD);  Surgeon: Daneil Dolin, MD;  [facility];  Service: Endoscopy;  Laterality: N/A;  removal of food impaction   ESOPHAGOGASTRODUODENOSCOPY N/A 11/19/2017   Procedure: ESOPHAGOGASTRODUODENOSCOPY (EGD);  Surgeon: Rogene Houston, MD;  [facility];  Service: Endoscopy;  Laterality: N/A;  7:30   ESOPHAGOGASTRODUODENOSCOPY N/A 12/29/2017   Procedure: ESOPHAGOGASTRODUODENOSCOPY (EGD);  Surgeon: Rogene Houston, MD;  [facility];  Service: Endoscopy;  Laterality: N/A;  730   ESOPHAGOGASTRODUODENOSCOPY (EGD) WITH PROPOFOL N/A 09/08/2020   Procedure: ESOPHAGOGASTRODUODENOSCOPY (EGD) WITH PROPOFOL;  Surgeon: Harvel Quale, MD;  [facility];  Service: Gastroenterology;  Laterality: N/A;  am   HERNIA REPAIR     HYDROCELE EXCISION     bilateral   JOINT REPLACEMENT     left knee 9/12   MELANOMA EXCISION N/A 06/11/2017   Procedure: WIDE LOCAL EXCISION WITH ADVANCEMENT FLAP CLOSURE LEFT NECK MELANOMA;  Surgeon: Stark Klein, MD;  Location: [place];  Service: General;  Laterality: N/A;  GENERAL AND LOCAL   TOTAL KNEE ARTHROPLASTY  06/24/2011   Procedure: TOTAL KNEE ARTHROPLASTY;  Surgeon: Gearlean Alf;  [facility];  Service: Orthopedics;  Laterality: Right;     Family History  Problem Relation Age of Onset   Breast cancer Mother    Prostate cancer Brother    Nephritis Brother    Rectal cancer Brother    Cirrhosis Brother    Liver cancer Brother    Healthy Daughter    Healthy Son     Social History   Social History Narrative   Not on file   Social History   Tobacco Use   Smoking status: Former    Packs/day: 1.00    Years: 30.00    Pack years: 30.00    Types: Cigarettes    Quit  date: 06/16/1962    Years since quitting: 58.5   Smokeless tobacco: Former    Types: Chew    Quit date: 06/16/2006  Substance Use Topics   Alcohol use: No    Comment: quit  60 yrs ago    Current Meds  Medication Sig   aspirin 81 MG tablet Take 81 mg by mouth every morning.    cholecalciferol (VITAMIN D3) 25 MCG (1000 UNIT) tablet Take 1,000 Units by mouth daily.   NP THYROID 120 MG tablet TAKE 1 TABLET(120 MG) BY MOUTH DAILY   omeprazole (PRILOSEC) 40 MG capsule Take 40 mg by mouth daily.   triamcinolone (KENALOG) 0.1 % Apply 1 application topically 2 (two) times daily.     [place] Office Visit from 05/02/2020 in [facility]  PHQ-9 Total Score 0       Objective:   Today's Vitals: BP 130/68   Pulse 71   Temp (!) 97.3 F (36.3 C) (Temporal)   Ht 5'  1" (1.549 m)   Wt 180 lb (81.6 kg)   SpO2 96%   BMI 34.01 kg/m  Vitals with BMI 12/25/2020 11/02/2020 10/09/2020  Height 5\' 1"  5\' 1"  5\' 5"   Weight 180 lbs 173 lbs 176 lbs 6 oz  BMI 34.03 17.7 11.65  Systolic 790 383 338  Diastolic 68 64 64  Pulse 71 66 87     Physical Exam  He looks slightly frail but appropriate for his age of 87 years old.  Blood pressure acceptable and in good range for his age.  He appears to be alert.     Assessment   1. Hypothyroidism, unspecified type   2. Malaise and fatigue   3. Vitamin D deficiency disease   4. Memory loss       Tests ordered Orders Placed This Encounter  Procedures   CBC   COMPLETE METABOLIC PANEL WITH GFR   VITAMIN D 25 Hydroxy (Vit-D Deficiency, Fractures)   TSH   T3, free   B12 and Folate Panel   RPR   Ambulatory referral to Neurology      Plan: 1.  He will continue with NP thyroid 120 mg daily and we will check thyroid function to see if it is being appropriately taken. 2.  Other blood work ordered above including blood work for memory loss. 3.  I will refer him to neurology for further evaluation of the memory loss. 4.  Follow-up in 6  months.    No orders of the defined types were placed in this encounter.   Doree Albee, MD

## 2020-12-26 LAB — COMPLETE METABOLIC PANEL WITH GFR
AG Ratio: 1.8 (calc) (ref 1.0–2.5)
ALT: 10 U/L (ref 9–46)
AST: 17 U/L (ref 10–35)
Albumin: 4.2 g/dL (ref 3.6–5.1)
Alkaline phosphatase (APISO): 66 U/L (ref 35–144)
BUN/Creatinine Ratio: 13 (calc) (ref 6–22)
BUN: 17 mg/dL (ref 7–25)
CO2: 26 mmol/L (ref 20–32)
Calcium: 9.2 mg/dL (ref 8.6–10.3)
Chloride: 105 mmol/L (ref 98–110)
Creat: 1.29 mg/dL — ABNORMAL HIGH (ref 0.70–1.22)
Globulin: 2.4 g/dL (calc) (ref 1.9–3.7)
Glucose, Bld: 109 mg/dL — ABNORMAL HIGH (ref 65–99)
Potassium: 4.3 mmol/L (ref 3.5–5.3)
Sodium: 141 mmol/L (ref 135–146)
Total Bilirubin: 0.5 mg/dL (ref 0.2–1.2)
Total Protein: 6.6 g/dL (ref 6.1–8.1)
eGFR: 52 mL/min/{1.73_m2} — ABNORMAL LOW (ref >=60)

## 2020-12-26 LAB — CBC
HCT: 36.1 % — ABNORMAL LOW (ref 38.5–50.0)
Hemoglobin: 12 g/dL — ABNORMAL LOW (ref 13.2–17.1)
MCH: 29.3 pg (ref 27.0–33.0)
MCHC: 33.2 g/dL (ref 32.0–36.0)
MCV: 88 fL (ref 80.0–100.0)
MPV: 12.6 fL — ABNORMAL HIGH (ref 7.5–12.5)
Platelets: 216 10*3/uL (ref 140–400)
RBC: 4.1 10*6/uL — ABNORMAL LOW (ref 4.20–5.80)
RDW: 14.3 % (ref 11.0–15.0)
WBC: 5.7 10*3/uL (ref 3.8–10.8)

## 2020-12-26 LAB — B12 AND FOLATE PANEL
Folate: 9.7 ng/mL
Vitamin B-12: 368 pg/mL (ref 200–1100)

## 2020-12-26 LAB — VITAMIN D 25 HYDROXY (VIT D DEFICIENCY, FRACTURES): Vit D, 25-Hydroxy: 38 ng/mL (ref 30–100)

## 2020-12-26 LAB — RPR: RPR Ser Ql: NONREACTIVE

## 2020-12-26 LAB — T3, FREE: T3, Free: 2.9 pg/mL (ref 2.3–4.2)

## 2020-12-26 LAB — TSH: TSH: 61.44 mIU/L — ABNORMAL HIGH (ref 0.40–4.50)

## 2021-02-06 ENCOUNTER — Encounter (HOSPITAL_COMMUNITY): Payer: Self-pay

## 2021-02-06 ENCOUNTER — Ambulatory Visit (HOSPITAL_COMMUNITY): Payer: Medicare Other | Admitting: Anesthesiology

## 2021-02-06 ENCOUNTER — Observation Stay (HOSPITAL_COMMUNITY)
Admission: EM | Admit: 2021-02-06 | Discharge: 2021-02-07 | Disposition: A | Discharge status: Home / Self Care | Payer: Medicare Other | Attending: Gastroenterology | Admitting: Gastroenterology

## 2021-02-06 ENCOUNTER — Other Ambulatory Visit: Payer: Self-pay

## 2021-02-06 ENCOUNTER — Encounter (HOSPITAL_COMMUNITY)
Admission: EM | Disposition: A | Discharge status: Home / Self Care | Payer: Self-pay | Source: Home / Self Care | Attending: Emergency Medicine

## 2021-02-06 DIAGNOSIS — Z8546 Personal history of malignant neoplasm of prostate: Secondary | ICD-10-CM | POA: Diagnosis not present

## 2021-02-06 DIAGNOSIS — Z85820 Personal history of malignant melanoma of skin: Secondary | ICD-10-CM | POA: Insufficient documentation

## 2021-02-06 DIAGNOSIS — K2100 Gastro-esophageal reflux disease with esophagitis, without bleeding: Secondary | ICD-10-CM | POA: Insufficient documentation

## 2021-02-06 DIAGNOSIS — X58XXXA Exposure to other specified factors, initial encounter: Secondary | ICD-10-CM | POA: Insufficient documentation

## 2021-02-06 DIAGNOSIS — Z20822 Contact with and (suspected) exposure to covid-19: Secondary | ICD-10-CM | POA: Diagnosis not present

## 2021-02-06 DIAGNOSIS — K219 Gastro-esophageal reflux disease without esophagitis: Secondary | ICD-10-CM

## 2021-02-06 DIAGNOSIS — Z87891 Personal history of nicotine dependence: Secondary | ICD-10-CM | POA: Diagnosis not present

## 2021-02-06 DIAGNOSIS — Z7982 Long term (current) use of aspirin: Secondary | ICD-10-CM | POA: Diagnosis not present

## 2021-02-06 DIAGNOSIS — T18128A Food in esophagus causing other injury, initial encounter: Secondary | ICD-10-CM | POA: Diagnosis not present

## 2021-02-06 DIAGNOSIS — K759 Inflammatory liver disease, unspecified: Secondary | ICD-10-CM | POA: Insufficient documentation

## 2021-02-06 DIAGNOSIS — E039 Hypothyroidism, unspecified: Secondary | ICD-10-CM | POA: Diagnosis not present

## 2021-02-06 DIAGNOSIS — Z96651 Presence of right artificial knee joint: Secondary | ICD-10-CM | POA: Diagnosis not present

## 2021-02-06 DIAGNOSIS — K222 Esophageal obstruction: Secondary | ICD-10-CM | POA: Diagnosis not present

## 2021-02-06 DIAGNOSIS — Z79899 Other long term (current) drug therapy: Secondary | ICD-10-CM | POA: Insufficient documentation

## 2021-02-06 HISTORY — PX: ESOPHAGOGASTRODUODENOSCOPY (EGD) WITH PROPOFOL: SHX5813

## 2021-02-06 LAB — CBC WITH DIFFERENTIAL/PLATELET
Abs Immature Granulocytes: 0.01 10*3/uL (ref 0.00–0.07)
Basophils Absolute: 0.1 10*3/uL (ref 0.0–0.1)
Basophils Relative: 1 %
Eosinophils Absolute: 0.2 10*3/uL (ref 0.0–0.5)
Eosinophils Relative: 4 %
HCT: 37.2 % — ABNORMAL LOW (ref 39.0–52.0)
Hemoglobin: 12.2 g/dL — ABNORMAL LOW (ref 13.0–17.0)
Immature Granulocytes: 0 %
Lymphocytes Relative: 20 %
Lymphs Abs: 1.3 10*3/uL (ref 0.7–4.0)
MCH: 30.3 pg (ref 26.0–34.0)
MCHC: 32.8 g/dL (ref 30.0–36.0)
MCV: 92.3 fL (ref 80.0–100.0)
Monocytes Absolute: 0.5 10*3/uL (ref 0.1–1.0)
Monocytes Relative: 8 %
Neutro Abs: 4.3 10*3/uL (ref 1.7–7.7)
Neutrophils Relative %: 67 %
Platelets: 213 10*3/uL (ref 150–400)
RBC: 4.03 MIL/uL — ABNORMAL LOW (ref 4.22–5.81)
RDW: 14 % (ref 11.5–15.5)
WBC: 6.3 10*3/uL (ref 4.0–10.5)
nRBC: 0 % (ref 0.0–0.2)

## 2021-02-06 LAB — RESP PANEL BY RT-PCR (FLU A&B, COVID) ARPGX2
Influenza A by PCR: NEGATIVE
Influenza B by PCR: NEGATIVE
SARS Coronavirus 2 by RT PCR: NEGATIVE

## 2021-02-06 LAB — BASIC METABOLIC PANEL
Anion gap: 12 (ref 5–15)
BUN: 19 mg/dL (ref 8–23)
CO2: 23 mmol/L (ref 22–32)
Calcium: 8.7 mg/dL — ABNORMAL LOW (ref 8.9–10.3)
Chloride: 103 mmol/L (ref 98–111)
Creatinine, Ser: 1.08 mg/dL (ref 0.61–1.24)
GFR, Estimated: >60 mL/min (ref >60)
Glucose, Bld: 111 mg/dL — ABNORMAL HIGH (ref 70–99)
Potassium: 4.1 mmol/L (ref 3.5–5.1)
Sodium: 138 mmol/L (ref 135–145)

## 2021-02-06 SURGERY — ESOPHAGOGASTRODUODENOSCOPY (EGD) WITH PROPOFOL
Anesthesia: General

## 2021-02-06 MED ORDER — FENTANYL CITRATE (PF) 100 MCG/2ML IJ SOLN
INTRAMUSCULAR | Status: DC | PRN
  Administered 2021-02-06: 25 ug via INTRAVENOUS

## 2021-02-06 MED ORDER — EPHEDRINE SULFATE-NACL 50-0.9 MG/10ML-% IV SOSY
PREFILLED_SYRINGE | INTRAVENOUS | Status: DC | PRN
  Administered 2021-02-06 (×2): 5 mg via INTRAVENOUS

## 2021-02-06 MED ORDER — LACTATED RINGERS IV SOLN: INTRAVENOUS | Status: DC

## 2021-02-06 MED ORDER — SUCCINYLCHOLINE CHLORIDE 200 MG/10ML IV SOSY
PREFILLED_SYRINGE | INTRAVENOUS | Status: AC
  Filled 2021-02-06: qty 10

## 2021-02-06 MED ORDER — ONDANSETRON HCL 4 MG/2ML IJ SOLN
4.0000 mg | Freq: Once | INTRAMUSCULAR | Status: AC
  Administered 2021-02-06: 4 mg via INTRAVENOUS
  Filled 2021-02-06: qty 2

## 2021-02-06 MED ORDER — LIDOCAINE HCL (PF) 2 % IJ SOLN
INTRAMUSCULAR | Status: AC
  Filled 2021-02-06: qty 5

## 2021-02-06 MED ORDER — SUCCINYLCHOLINE CHLORIDE 200 MG/10ML IV SOSY
PREFILLED_SYRINGE | INTRAVENOUS | Status: DC | PRN
  Administered 2021-02-06: 120 mg via INTRAVENOUS

## 2021-02-06 MED ORDER — PROPOFOL 10 MG/ML IV BOLUS
INTRAVENOUS | Status: DC | PRN
  Administered 2021-02-06: 80 mg via INTRAVENOUS

## 2021-02-06 MED ORDER — PHENYLEPHRINE 40 MCG/ML (10ML) SYRINGE FOR IV PUSH (FOR BLOOD PRESSURE SUPPORT)
PREFILLED_SYRINGE | INTRAVENOUS | Status: DC | PRN
  Administered 2021-02-06: 80 ug via INTRAVENOUS

## 2021-02-06 MED ORDER — FENTANYL CITRATE (PF) 100 MCG/2ML IJ SOLN
INTRAMUSCULAR | Status: AC
  Filled 2021-02-06: qty 2

## 2021-02-06 MED ORDER — GLUCAGON HCL RDNA (DIAGNOSTIC) 1 MG IJ SOLR
1.0000 mg | Freq: Once | INTRAMUSCULAR | Status: AC
  Administered 2021-02-06: 1 mg via INTRAVENOUS
  Filled 2021-02-06: qty 1

## 2021-02-06 MED ORDER — PROPOFOL 10 MG/ML IV BOLUS
INTRAVENOUS | Status: AC
  Filled 2021-02-06: qty 20

## 2021-02-06 MED ORDER — LIDOCAINE HCL (CARDIAC) PF 100 MG/5ML IV SOSY
PREFILLED_SYRINGE | INTRAVENOUS | Status: DC | PRN
  Administered 2021-02-06: 60 mg via INTRAVENOUS

## 2021-02-06 MED ORDER — SODIUM CHLORIDE 0.9 % IV SOLN: INTRAVENOUS | Status: DC

## 2021-02-06 MED ORDER — ONDANSETRON HCL 4 MG/2ML IJ SOLN
INTRAMUSCULAR | Status: DC | PRN
  Administered 2021-02-06: 4 mg via INTRAVENOUS

## 2021-02-06 MED ORDER — DEXAMETHASONE SODIUM PHOSPHATE 4 MG/ML IJ SOLN
INTRAMUSCULAR | Status: DC | PRN
  Administered 2021-02-06: 8 mg via INTRAVENOUS

## 2021-02-06 NOTE — ED Notes (Signed)
PO fluid challenge done,  Pt able to tolerate some water better than prior but not back to normal

## 2021-02-06 NOTE — Anesthesia Procedure Notes (Addendum)
Procedure Name: Intubation Date/Time: 02/06/2021 11:09 PM Performed by: Denese Killings, MD Pre-anesthesia Checklist: Patient identified, Emergency Drugs available, Suction available and Patient being monitored Patient Re-evaluated:Patient Re-evaluated prior to induction Oxygen Delivery Method: Circle system utilized Preoxygenation: Pre-oxygenation with 100% oxygen Induction Type: IV induction, Rapid sequence and Cricoid Pressure applied Laryngoscope Size: 3 Grade View: Grade I Tube type: Oral Tube size: 7.5 mm Number of attempts: 1 Airway Equipment and Method: Stylet Placement Confirmation: ETT inserted through vocal cords under direct vision, positive ETCO2 and breath sounds checked- equal and bilateral Secured at: 23 cm Tube secured with: Tape Dental Injury: Teeth and Oropharynx as per pre-operative assessment

## 2021-02-06 NOTE — ED Provider Notes (Signed)
[facility] EMERGENCY DEPARTMENT Provider Note   CSN: [id number] Arrival date & time: 02/06/21  1939     History Chief Complaint  Patient presents with   "something stuck in throat"    Pt ate chk around noon- now can't swallow liquids    Evan Miller is a 87 y.o. male.  HPI 87 year old male presents with inability to swallow.  He was eating lunch and was eating chicken and feels like it stuck.  He cannot swallow liquids.  He is not drooling but cannot swallow anything.  This is a recurrent problem and he has had multiple esophageal dilations.  No pain besides his throat hurts from where the food is stuck.  Past Medical History:  Diagnosis Date   Arthritis    back,shoulders and hips   Cancer ([place])    basal cell/ Melanoma 1997 left  shoulder/  PROSTATE   GERD (gastroesophageal reflux disease)    Hepatitis    age 56 years   Hypothyroidism    PONV (postoperative nausea and vomiting)    Prostate CA [facility])     Patient Active Problem List   Diagnosis Date Noted   Murmur 08/12/2019   Esophageal stricture 11/19/2017   Food impaction of esophagus 11/06/2017   Dysphagia 09/27/2015   History of colonic polyps 07/28/2012   Left sided abdominal pain 07/28/2012   Hypothyroidism 07/28/2012   OA (osteoarthritis) of knee 06/24/2011    Past Surgical History:  Procedure Laterality Date   BIOPSY  09/08/2020   Procedure: BIOPSY;  Surgeon: Harvel Quale, MD;  [facility];  Service: Gastroenterology;;   CATARACT EXTRACTION W/PHACO  12/16/2011   Procedure: CATARACT EXTRACTION PHACO AND INTRAOCULAR LENS PLACEMENT ([place]);  Surgeon: Tonny Branch, MD;  Location: [facility] ORS;  Service: Ophthalmology;  Laterality: Left;  CDE:17.24    CATARACT EXTRACTION W/PHACO  12/30/2011   Procedure: CATARACT EXTRACTION PHACO AND INTRAOCULAR LENS PLACEMENT ([facility]);  Surgeon: Tonny Branch, MD;  Location: [facility] ORS;  Service: Ophthalmology;  Laterality: Right;  CDE 15.40   COLONOSCOPY     COLONOSCOPY  N/A 08/07/2012   Procedure: COLONOSCOPY;  Surgeon: Rogene Houston, MD;  Location: [facility];  Service: Endoscopy;  Laterality: N/A;  10:00   COLONOSCOPY N/A 11/22/2015   Procedure: COLONOSCOPY;  Surgeon: Rogene Houston, MD;  [facility]: [facility];  Service: Endoscopy;  Laterality: N/A;  12:00 - moved to 10:30 - Ann to notify   ESOPHAGEAL DILATION N/A 02/10/2015   Procedure: ESOPHAGEAL DILATION;  Surgeon: Rogene Houston, MD;  [facility];  Service: Endoscopy;  Laterality: N/A;   ESOPHAGEAL DILATION N/A 03/29/2015   Procedure: ESOPHAGEAL DILATION;  Surgeon: Rogene Houston, MD;  [facility]: [facility];  Service: Endoscopy;  Laterality: N/A;   ESOPHAGEAL DILATION  11/22/2015   Procedure: ESOPHAGEAL DILATION;  Surgeon: Rogene Houston, MD;  [facility];  Service: Endoscopy;;   ESOPHAGEAL DILATION N/A 11/19/2017   Procedure: ESOPHAGEAL DILATION;  Surgeon: Rogene Houston, MD;  [facility];  Service: Endoscopy;  Laterality: N/A;   ESOPHAGEAL DILATION N/A 12/29/2017   Procedure: ESOPHAGEAL DILATION;  Surgeon: Rogene Houston, MD;  [facility];  Service: Endoscopy;  Laterality: N/A;   ESOPHAGEAL DILATION N/A 09/08/2020   Procedure: ESOPHAGEAL DILATION;  Surgeon: Harvel Quale, MD;  [facility];  Service: Gastroenterology;  Laterality: N/A;   ESOPHAGOGASTRODUODENOSCOPY N/A 02/10/2015   Procedure: ESOPHAGOGASTRODUODENOSCOPY (EGD);  Surgeon: Rogene Houston, MD;  [facility];  Service: Endoscopy;  Laterality: N/A;  925 - moved to 10:20 - Ann to notify pt   ESOPHAGOGASTRODUODENOSCOPY N/A 03/29/2015   Procedure: ESOPHAGOGASTRODUODENOSCOPY (EGD);  Surgeon: Rogene Houston, MD;  [facility];  Service: Endoscopy;  Laterality: N/A;  145 - moved to 8:30 - Ann notified pt   ESOPHAGOGASTRODUODENOSCOPY N/A 11/22/2015   Procedure: ESOPHAGOGASTRODUODENOSCOPY (EGD);  Surgeon: Rogene Houston, MD;  [facility];   Service: Endoscopy;  Laterality: N/A;   ESOPHAGOGASTRODUODENOSCOPY N/A 10/27/2017   Procedure: ESOPHAGOGASTRODUODENOSCOPY (EGD);  Surgeon: Daneil Dolin, MD;  [facility];  Service: Endoscopy;  Laterality: N/A;  removal of food impaction   ESOPHAGOGASTRODUODENOSCOPY N/A 11/19/2017   Procedure: ESOPHAGOGASTRODUODENOSCOPY (EGD);  Surgeon: Rogene Houston, MD;  [facility];  Service: Endoscopy;  Laterality: N/A;  7:30   ESOPHAGOGASTRODUODENOSCOPY N/A 12/29/2017   Procedure: ESOPHAGOGASTRODUODENOSCOPY (EGD);  Surgeon: Rogene Houston, MD;  [facility];  Service: Endoscopy;  Laterality: N/A;  730   ESOPHAGOGASTRODUODENOSCOPY (EGD) WITH PROPOFOL N/A 09/08/2020   Procedure: ESOPHAGOGASTRODUODENOSCOPY (EGD) WITH PROPOFOL;  Surgeon: Harvel Quale, MD;  [facility]: [facility];  Service: Gastroenterology;  Laterality: N/A;  am   HERNIA REPAIR     HYDROCELE EXCISION     bilateral   JOINT REPLACEMENT     left knee 9/12   MELANOMA EXCISION N/A 06/11/2017   Procedure: WIDE LOCAL EXCISION WITH ADVANCEMENT FLAP CLOSURE LEFT NECK MELANOMA;  Surgeon: Stark Klein, MD;  Location: [place];  Service: General;  Laterality: N/A;  GENERAL AND LOCAL   TOTAL KNEE ARTHROPLASTY  06/24/2011   Procedure: TOTAL KNEE ARTHROPLASTY;  Surgeon: Gearlean Alf;  [facility]: [facility];  Service: Orthopedics;  Laterality: Right;       Family History  Problem Relation Age of Onset   Breast cancer Mother    Prostate cancer Brother    Nephritis Brother    Rectal cancer Brother    Cirrhosis Brother    Liver cancer Brother    Healthy Daughter    Healthy Son     Social History   Tobacco Use   Smoking status: Former    Packs/day: 1.00    Years: 30.00    Pack years: 30.00    Types: Cigarettes    Quit date: 06/16/1962    Years since quitting: 58.6   Smokeless tobacco: Former    Types: Chew    Quit date: 06/16/2006  Vaping Use   Vaping Use: Never used  Substance  Use Topics   Alcohol use: No    Comment: quit  60 yrs ago   Drug use: No    Home Medications Prior to Admission medications   Medication Sig Start Date End Date Taking? Authorizing Provider  aspirin 81 MG tablet Take 81 mg by mouth every morning.  01/01/18   Rehman, Mechele Dawley, MD  cholecalciferol (VITAMIN D3) 25 MCG (1000 UNIT) tablet Take 1,000 Units by mouth daily.    [provider]  NP THYROID 120 MG tablet TAKE 1 TABLET(120 MG) BY MOUTH DAILY 11/02/20   Hurshel Party C, MD  omeprazole (PRILOSEC) 40 MG capsule Take 40 mg by mouth daily.    [provider]  triamcinolone (KENALOG) 0.1 % Apply 1 application topically 2 (two) times daily. 05/02/20   Doree Albee, MD    Allergies    Ciprofloxacin, Penicillins, and Sulfa antibiotics  Review of Systems   Review of Systems  HENT:  Positive for sore throat and trouble swallowing.   Respiratory:  Negative for shortness of breath.   Cardiovascular:  Negative for chest pain.  All other systems reviewed and are negative.  Physical Exam Updated Vital Signs BP 134/81   Pulse 64   Temp 98.1 F (36.7 C) (Oral)   Resp 16   Ht '5\' 1"'$  (1.549 m)   Wt 81.6 kg   SpO2 96%   BMI 33.99 kg/m   Physical Exam Vitals and nursing note reviewed.  Constitutional:      Appearance: He is well-developed. He is not ill-appearing or diaphoretic.  HENT:     Head: Normocephalic and atraumatic.     Right Ear: External ear normal.     Left Ear: External ear normal.     Nose: Nose normal.     Mouth/Throat:     Pharynx: Oropharynx is clear.     Comments: No drooling Eyes:     General:        Right eye: No discharge.        Left eye: No discharge.  Cardiovascular:     Rate and Rhythm: Normal rate and regular rhythm.     Heart sounds: Normal heart sounds.  Pulmonary:     Effort: Pulmonary effort is normal.     Breath sounds: Normal breath sounds.  Abdominal:     General: There is no distension.  Musculoskeletal:      Cervical back: Neck supple.  Skin:    General: Skin is warm and dry.  Neurological:     Mental Status: He is alert.  Psychiatric:        Mood and Affect: Mood is not anxious.    ED Results / Procedures / Treatments   Labs (all labs ordered are listed, but only abnormal results are displayed) Labs Reviewed  CBC WITH DIFFERENTIAL/PLATELET - Abnormal; Notable for the following components:      Result Value   RBC 4.03 (*)    Hemoglobin 12.2 (*)    HCT 37.2 (*)    All other components within normal limits  BASIC METABOLIC PANEL - Abnormal; Notable for the following components:   Glucose, Bld 111 (*)    Calcium 8.7 (*)    All other components within normal limits  RESP PANEL BY RT-PCR (FLU A&B, COVID) ARPGX2    EKG None  Radiology No results found.  Procedures Procedures   Medications Ordered in ED Medications  0.9 %  sodium chloride infusion (has no administration in time range)  lactated ringers infusion ( Intravenous New Bag/Given 02/06/21 2254)  glucagon (human recombinant) (GLUCAGEN) injection 1 mg (1 mg Intravenous Given 02/06/21 2128)  ondansetron (ZOFRAN) injection 4 mg (4 mg Intravenous Given 02/06/21 2127)    ED Course  I have reviewed the triage vital signs and the nursing notes.  Pertinent labs & imaging results that were available during my care of the patient were reviewed by me and considered in my medical decision making (see chart for details).    MDM Rules/Calculators/A&P                           Patient is unable to swallow.  I did try glucagon after discussion with gastroenterology, but it did not seem to help and he is still unable to swallow.  Dr. Magnus Ivan will take to the OR for EGD. Final Clinical Impression(s) / ED Diagnoses Final diagnoses:  Food impaction of esophagus, initial  encounter    Rx / DC Orders ED Discharge Orders     None        Sherwood Gambler, MD 02/06/21 2349

## 2021-02-06 NOTE — Consult Note (Signed)
[name], M.D. Gastroenterology & Hepatology                                           Patient Name: ABDIKADIR FOHL Account #: '@FLAACCTNO' @   MRN: [id number] Admission Date: 02/06/2021 Date of Evaluation:  02/06/2021 Time of Evaluation: 10:28 PM   Referring Physician: Crist Infante, MD  Chief Complaint:  food impaction  HPI: This is a 87 year old M with history of GERD, arthritis, hypothyroidism, history of Schatzki's ring , who comes to the hospital for evaluation after presenting an episode of food impaction. The patient reports he was eating chicken today at 12 PM and he felt the food got stuck in the retrosternal area.  Notably, the patient underwent an EGD on 09/08/2020 for evaluation of recurrent dysphagia.  At that time he had a balloon dilation for Schatzki's ring which was dilated up to 20 mm, no disruption was observed at that time but the ring was further disrupted with cold forceps.  Biopsies were taken from the mid and distal esophagus which were negative for eosinophilic esophagitis or although alteration.  The son reports that he was doing fine until today when he swallowed chicken.  Drooling:No but spitting food frequently Able to swallow food:No Able to drink liquids: No Previous reflux symptoms:Not recently Weight changes:No Seasonal allergies: No  Previous episodes: Yes, had to undergo removal in the past  Medications currently used: Currently taking Prilosec 40 mg every day although this is not 100% clear.  He is on.   In the ER, his vital signs were stable. He was protecting his airway. Labs were notable for normal CBC and BMP. Negative COVID. Patient did not improve with glucagon x1.   Past Medical History: SEE CHRONIC ISSSUES: Past Medical History:  Diagnosis Date   Arthritis    back,shoulders and hips   Cancer ([place])    basal cell/ Melanoma 1997 left  shoulder/  PROSTATE   GERD (gastroesophageal reflux disease)    Hepatitis    age 78 years    Hypothyroidism    PONV (postoperative nausea and vomiting)    Prostate CA [facility])    Past Surgical History:  Past Surgical History:  Procedure Laterality Date   BIOPSY  09/08/2020   Procedure: BIOPSY;  Surgeon: Harvel Quale, MD;  [facility];  Service: Gastroenterology;;   CATARACT EXTRACTION W/PHACO  12/16/2011   Procedure: CATARACT EXTRACTION PHACO AND INTRAOCULAR LENS PLACEMENT ([place]);  Surgeon: Tonny Branch, MD;  Location: [facility];  Service: Ophthalmology;  Laterality: Left;  CDE:17.24    CATARACT EXTRACTION W/PHACO  12/30/2011   Procedure: CATARACT EXTRACTION PHACO AND INTRAOCULAR LENS PLACEMENT ([facility]);  Surgeon: Tonny Branch, MD;  Location: [facility];  Service: Ophthalmology;  Laterality: Right;  CDE 15.40   COLONOSCOPY     COLONOSCOPY N/A 08/07/2012   Procedure: COLONOSCOPY;  Surgeon: Rogene Houston, MD;  Location: [facility];  Service: Endoscopy;  Laterality: N/A;  10:00   COLONOSCOPY N/A 11/22/2015   Procedure: COLONOSCOPY;  Surgeon: Rogene Houston, MD;  [facility];  Service: Endoscopy;  Laterality: N/A;  12:00 - moved to 10:30 - Ann to notify   ESOPHAGEAL DILATION N/A 02/10/2015   Procedure: ESOPHAGEAL DILATION;  Surgeon: Rogene Houston, MD;  [facility];  Service: Endoscopy;  Laterality: N/A;   ESOPHAGEAL DILATION N/A 03/29/2015  Procedure: ESOPHAGEAL DILATION;  Surgeon: Rogene Houston, MD;  [facility];  Service: Endoscopy;  Laterality: N/A;   ESOPHAGEAL DILATION  11/22/2015   Procedure: ESOPHAGEAL DILATION;  Surgeon: Rogene Houston, MD;  [facility];  Service: Endoscopy;;   ESOPHAGEAL DILATION N/A 11/19/2017   Procedure: ESOPHAGEAL DILATION;  Surgeon: Rogene Houston, MD;  [facility];  Service: Endoscopy;  Laterality: N/A;   ESOPHAGEAL DILATION N/A 12/29/2017   Procedure: ESOPHAGEAL DILATION;  Surgeon: Rogene Houston, MD;  [facility];  Service: Endoscopy;  Laterality: N/A;   ESOPHAGEAL  DILATION N/A 09/08/2020   Procedure: ESOPHAGEAL DILATION;  Surgeon: Harvel Quale, MD;  [facility];  Service: Gastroenterology;  Laterality: N/A;   ESOPHAGOGASTRODUODENOSCOPY N/A 02/10/2015   Procedure: ESOPHAGOGASTRODUODENOSCOPY (EGD);  Surgeon: Rogene Houston, MD;  [facility];  Service: Endoscopy;  Laterality: N/A;  925 - moved to 10:20 - Ann to notify pt   ESOPHAGOGASTRODUODENOSCOPY N/A 03/29/2015   Procedure: ESOPHAGOGASTRODUODENOSCOPY (EGD);  Surgeon: Rogene Houston, MD;  [facility];  Service: Endoscopy;  Laterality: N/A;  145 - moved to 8:30 - Ann notified pt   ESOPHAGOGASTRODUODENOSCOPY N/A 11/22/2015   Procedure: ESOPHAGOGASTRODUODENOSCOPY (EGD);  Surgeon: Rogene Houston, MD;  [facility];  Service: Endoscopy;  Laterality: N/A;   ESOPHAGOGASTRODUODENOSCOPY N/A 10/27/2017   Procedure: ESOPHAGOGASTRODUODENOSCOPY (EGD);  Surgeon: Daneil Dolin, MD;  [facility];  Service: Endoscopy;  Laterality: N/A;  removal of food impaction   ESOPHAGOGASTRODUODENOSCOPY N/A 11/19/2017   Procedure: ESOPHAGOGASTRODUODENOSCOPY (EGD);  Surgeon: Rogene Houston, MD;  [facility];  Service: Endoscopy;  Laterality: N/A;  7:30   ESOPHAGOGASTRODUODENOSCOPY N/A 12/29/2017   Procedure: ESOPHAGOGASTRODUODENOSCOPY (EGD);  Surgeon: Rogene Houston, MD;  [facility];  Service: Endoscopy;  Laterality: N/A;  730   ESOPHAGOGASTRODUODENOSCOPY (EGD) WITH PROPOFOL N/A 09/08/2020   Procedure: ESOPHAGOGASTRODUODENOSCOPY (EGD) WITH PROPOFOL;  Surgeon: Harvel Quale, MD;  [facility];  Service: Gastroenterology;  Laterality: N/A;  am   HERNIA REPAIR     HYDROCELE EXCISION     bilateral   JOINT REPLACEMENT     left knee 9/12   MELANOMA EXCISION N/A 06/11/2017   Procedure: WIDE LOCAL EXCISION WITH ADVANCEMENT FLAP CLOSURE LEFT NECK MELANOMA;  Surgeon: Stark Klein, MD;  Location: [place];   Service: General;  Laterality: N/A;  GENERAL AND LOCAL   TOTAL KNEE ARTHROPLASTY  06/24/2011   Procedure: TOTAL KNEE ARTHROPLASTY;  Surgeon: Gearlean Alf;  [facility];  Service: Orthopedics;  Laterality: Right;   Family History:  Family History  Problem Relation Age of Onset   Breast cancer Mother    Prostate cancer Brother    Nephritis Brother    Rectal cancer Brother    Cirrhosis Brother    Liver cancer Brother    Healthy Daughter    Healthy Son    Social History:  Social History   Tobacco Use   Smoking status: Former    Packs/day: 1.00    Years: 30.00    Pack years: 30.00    Types: Cigarettes    Quit date: 06/16/1962    Years since quitting: 58.6   Smokeless tobacco: Former    Types: Chew    Quit date: 06/16/2006  Vaping Use   Vaping Use: Never used  Substance Use Topics   Alcohol use: No    Comment: quit  60 yrs  ago   Drug use: No    Home Medications:  Prior to Admission medications   Medication Sig Start Date End Date Taking? Authorizing Provider  aspirin 81 MG tablet Take 81 mg by mouth every morning.  01/01/18   Rehman, Mechele Dawley, MD  cholecalciferol (VITAMIN D3) 25 MCG (1000 UNIT) tablet Take 1,000 Units by mouth daily.    [provider]  NP THYROID 120 MG tablet TAKE 1 TABLET(120 MG) BY MOUTH DAILY 11/02/20   Hurshel Party C, MD  omeprazole (PRILOSEC) 40 MG capsule Take 40 mg by mouth daily.    [provider]  triamcinolone (KENALOG) 0.1 % Apply 1 application topically 2 (two) times daily. 05/02/20   Doree Albee, MD    Inpatient Medications: No current facility-administered medications for this encounter.  Current Outpatient Medications:    aspirin 81 MG tablet, Take 81 mg by mouth every morning. , Disp: 30 tablet, Rfl:    cholecalciferol (VITAMIN D3) 25 MCG (1000 UNIT) tablet, Take 1,000 Units by mouth daily., Disp: , Rfl:    NP THYROID 120 MG tablet, TAKE 1 TABLET(120 MG) BY MOUTH DAILY, Disp: 30 tablet, Rfl: 3   omeprazole  (PRILOSEC) 40 MG capsule, Take 40 mg by mouth daily., Disp: , Rfl:    triamcinolone (KENALOG) 0.1 %, Apply 1 application topically 2 (two) times daily., Disp: 30 g, Rfl: 0 Allergies: Ciprofloxacin, Penicillins, and Sulfa antibiotics  Complete Review of Systems: GENERAL: negative for malaise, night sweats HEENT: No changes in hearing or vision, no nose bleeds or other nasal problems. NECK: Negative for lumps, goiter, pain and significant neck swelling RESPIRATORY: Negative for cough, wheezing CARDIOVASCULAR: Negative for chest pain, leg swelling, palpitations, orthopnea GI: SEE HPI MUSCULOSKELETAL: Negative for joint pain or swelling, back pain, and muscle pain. SKIN: Negative for lesions, rash PSYCH: Negative for sleep disturbance, mood disorder and recent psychosocial stressors. HEMATOLOGY Negative for prolonged bleeding, bruising easily, and swollen nodes. ENDOCRINE: Negative for cold or heat intolerance, polyuria, polydipsia and goiter. NEURO: negative for tremor, gait imbalance, syncope and seizures. The remainder of the review of systems is noncontributory.  Physical Exam: BP (!) 161/86 (BP Location: Right Arm)   Pulse 69   Temp 97.7 F (36.5 C) (Oral)   Resp 16   Ht '5\' 1"'  (1.549 m)   Wt 81.6 kg   SpO2 100%   BMI 33.99 kg/m  GENERAL: The patient is AO x3, in no acute distress. HEENT: Head is normocephalic and atraumatic. EOMI are intact. Mouth is well hydrated and without lesions. NECK: Supple. No masses LUNGS: Clear to auscultation. No presence of rhonchi/wheezing/rales. Adequate chest expansion HEART: RRR, normal s1 and s2. ABDOMEN: Soft, nontender, no guarding, no peritoneal signs, and nondistended. BS +. No masses. EXTREMITIES: Without any cyanosis, clubbing, rash, lesions or edema. NEUROLOGIC: AOx3, no focal motor deficit. SKIN: no jaundice, no rashes  Laboratory Data CBC:     Component Value Date/Time   WBC 6.3 02/06/2021 2020   RBC 4.03 (L) 02/06/2021 2020    HGB 12.2 (L) 02/06/2021 2020   HCT 37.2 (L) 02/06/2021 2020   PLT 213 02/06/2021 2020   MCV 92.3 02/06/2021 2020   MCH 30.3 02/06/2021 2020   MCHC 32.8 02/06/2021 2020   RDW 14.0 02/06/2021 2020   LYMPHSABS 1.3 02/06/2021 2020   MONOABS 0.5 02/06/2021 2020   EOSABS 0.2 02/06/2021 2020   BASOSABS 0.1 02/06/2021 2020   COAG:  Lab Results  Component Value Date   INR 0.90 06/11/2017  INR 1.01 06/17/2011   INR 1.00 02/21/2011    BMP:  BMP Latest Ref Rng & Units 02/06/2021 12/25/2020 09/27/2019  Glucose 70 - 99 mg/dL 111(H) 109(H) 104(H)  BUN 8 - 23 mg/dL '19 17 16  ' Creatinine 0.61 - 1.24 mg/dL 1.08 1.29(H) 1.01  BUN/Creat Ratio 6 - 22 (calc) - 13 -  Sodium 135 - 145 mmol/L 138 141 139  Potassium 3.5 - 5.1 mmol/L 4.1 4.3 4.1  Chloride 98 - 111 mmol/L 103 105 102  CO2 22 - 32 mmol/L '23 26 27  ' Calcium 8.9 - 10.3 mg/dL 8.7(L) 9.2 9.4    HEPATIC:  Hepatic Function Latest Ref Rng & Units 12/25/2020 09/27/2019 08/24/2019  Total Protein 6.1 - 8.1 g/dL 6.6 7.4 6.8  Albumin 3.5 - 5.0 g/dL - 4.3 -  AST 10 - 35 U/L '17 21 18  ' ALT 9 - 46 U/L '10 18 12  ' Alk Phosphatase 38 - 126 U/L - 59 -  Total Bilirubin 0.2 - 1.2 mg/dL 0.5 0.7 0.6  Bilirubin, Direct 0.1 - 0.5 mg/dL - - -    CARDIAC:  Lab Results  Component Value Date   TROPONINI <0.03 04/07/2017     Imaging: I personally reviewed and interpreted the available imaging.  Assessment & Plan: This is a 87 year old M with history of GERD, arthritis, hypothyroidism, history of Schatzki's ring , who comes to the hospital for evaluation after presenting an episode of food impaction. The patient has not presented any previous red flag signs.  This is likely related to recurrent stricture at the GE junction (Schatzki's ring). For now, we'll give to keep the patient nothing by mouth until the procedure is performed. He will require general anesthesia due to the risk of aspiration.  - Keep NPO - Emergent EGD - Anesthesia evaluation for general  anesthesia - high risk of aspiration  Harvel Quale, MD Gastroenterology and Hepatology [facility]

## 2021-02-06 NOTE — ED Notes (Signed)
ED Provider at bedside. 

## 2021-02-06 NOTE — Anesthesia Preprocedure Evaluation (Addendum)
Anesthesia Evaluation  Patient identified by MRN, date of birth, ID band Patient awake    Reviewed: Allergy & Precautions, NPO status , Patient's Chart, lab work & pertinent test results  History of Anesthesia Complications (+) PONV and history of anesthetic complications  Airway Mallampati: II  TM Distance: >3 FB Neck ROM: Full    Dental  (+) Edentulous Upper, Edentulous Lower   Pulmonary former smoker,    Pulmonary exam normal breath sounds clear to auscultation       Cardiovascular Exercise Tolerance: Good + Valvular Problems/Murmurs  Rhythm:Regular Rate:Normal + Systolic murmurs    Neuro/Psych negative neurological ROS  negative psych ROS   GI/Hepatic GERD  Medicated and Controlled,(+) Hepatitis -  Endo/Other  Hypothyroidism   Renal/GU negative Renal ROS     Musculoskeletal  (+) Arthritis , Osteoarthritis,    Abdominal   Peds  Hematology   Anesthesia Other Findings Prostate cancer  Reproductive/Obstetrics                            Anesthesia Physical  Anesthesia Plan  ASA: 3 and emergent  Anesthesia Plan: General   Post-op Pain Management:    Induction: Intravenous, Rapid sequence and Cricoid pressure planned  PONV Risk Score and Plan: 3 and Ondansetron and Dexamethasone  Airway Management Planned: Oral ETT  Additional Equipment:   Intra-op Plan:   Post-operative Plan: Extubation in OR  Informed Consent: I have reviewed the patients History and Physical, chart, labs and discussed the procedure including the risks, benefits and alternatives for the proposed anesthesia with the patient or authorized representative who has indicated his/her understanding and acceptance.       Plan Discussed with: Surgeon  Anesthesia Plan Comments:         Anesthesia Quick Evaluation

## 2021-02-06 NOTE — ED Notes (Signed)
Urinal provided for pt.

## 2021-02-06 NOTE — ED Notes (Signed)
Pt transported to Endo. Consent done and at bedside.

## 2021-02-06 NOTE — ED Triage Notes (Signed)
Pt presents from home with daughter, pt has hx of getting esophagus stretched and sometimes gets food bolus stuck in throat. Pt ate chk around noon and feels like it is stuck. Pt says he cannot swallow liquids. Pt can speak full sentences and airway is patent.

## 2021-02-07 ENCOUNTER — Encounter (HOSPITAL_COMMUNITY): Payer: Self-pay | Admitting: Internal Medicine

## 2021-02-07 DIAGNOSIS — Z7982 Long term (current) use of aspirin: Secondary | ICD-10-CM | POA: Diagnosis not present

## 2021-02-07 DIAGNOSIS — K219 Gastro-esophageal reflux disease without esophagitis: Secondary | ICD-10-CM

## 2021-02-07 DIAGNOSIS — T18108A Unspecified foreign body in esophagus causing other injury, initial encounter: Secondary | ICD-10-CM | POA: Diagnosis not present

## 2021-02-07 DIAGNOSIS — K222 Esophageal obstruction: Secondary | ICD-10-CM

## 2021-02-07 DIAGNOSIS — T18128A Food in esophagus causing other injury, initial encounter: Secondary | ICD-10-CM | POA: Diagnosis present

## 2021-02-07 DIAGNOSIS — E039 Hypothyroidism, unspecified: Secondary | ICD-10-CM | POA: Diagnosis not present

## 2021-02-07 DIAGNOSIS — Z96651 Presence of right artificial knee joint: Secondary | ICD-10-CM | POA: Diagnosis not present

## 2021-02-07 DIAGNOSIS — K759 Inflammatory liver disease, unspecified: Secondary | ICD-10-CM | POA: Diagnosis not present

## 2021-02-07 DIAGNOSIS — Z85820 Personal history of malignant melanoma of skin: Secondary | ICD-10-CM | POA: Diagnosis not present

## 2021-02-07 DIAGNOSIS — Z20822 Contact with and (suspected) exposure to covid-19: Secondary | ICD-10-CM | POA: Diagnosis not present

## 2021-02-07 DIAGNOSIS — K2100 Gastro-esophageal reflux disease with esophagitis, without bleeding: Secondary | ICD-10-CM | POA: Diagnosis not present

## 2021-02-07 DIAGNOSIS — Z87891 Personal history of nicotine dependence: Secondary | ICD-10-CM | POA: Diagnosis not present

## 2021-02-07 DIAGNOSIS — Z79899 Other long term (current) drug therapy: Secondary | ICD-10-CM | POA: Diagnosis not present

## 2021-02-07 LAB — CBC
HCT: 37.3 % — ABNORMAL LOW (ref 39.0–52.0)
Hemoglobin: 12.2 g/dL — ABNORMAL LOW (ref 13.0–17.0)
MCH: 30.1 pg (ref 26.0–34.0)
MCHC: 32.7 g/dL (ref 30.0–36.0)
MCV: 92.1 fL (ref 80.0–100.0)
Platelets: 211 10*3/uL (ref 150–400)
RBC: 4.05 MIL/uL — ABNORMAL LOW (ref 4.22–5.81)
RDW: 13.8 % (ref 11.5–15.5)
WBC: 10.6 10*3/uL — ABNORMAL HIGH (ref 4.0–10.5)
nRBC: 0 % (ref 0.0–0.2)

## 2021-02-07 LAB — PHOSPHORUS: Phosphorus: 4.2 mg/dL (ref 2.5–4.6)

## 2021-02-07 LAB — COMPREHENSIVE METABOLIC PANEL
ALT: 15 U/L (ref 0–44)
AST: 20 U/L (ref 15–41)
Albumin: 3.8 g/dL (ref 3.5–5.0)
Alkaline Phosphatase: 71 U/L (ref 38–126)
Anion gap: 11 (ref 5–15)
BUN: 17 mg/dL (ref 8–23)
CO2: 26 mmol/L (ref 22–32)
Calcium: 9.2 mg/dL (ref 8.9–10.3)
Chloride: 103 mmol/L (ref 98–111)
Creatinine, Ser: 0.99 mg/dL (ref 0.61–1.24)
GFR, Estimated: >60 mL/min (ref >60)
Glucose, Bld: 149 mg/dL — ABNORMAL HIGH (ref 70–99)
Potassium: 4.2 mmol/L (ref 3.5–5.1)
Sodium: 140 mmol/L (ref 135–145)
Total Bilirubin: 0.5 mg/dL (ref 0.3–1.2)
Total Protein: 6.6 g/dL (ref 6.5–8.1)

## 2021-02-07 LAB — PROTIME-INR
INR: 1 (ref 0.8–1.2)
Prothrombin Time: 13.4 seconds (ref 11.4–15.2)

## 2021-02-07 LAB — APTT: aPTT: 30 seconds (ref 24–36)

## 2021-02-07 LAB — MAGNESIUM: Magnesium: 2.3 mg/dL (ref 1.7–2.4)

## 2021-02-07 MED ORDER — ASPIRIN EC 81 MG PO TBEC
81.0000 mg | DELAYED_RELEASE_TABLET | Freq: Every morning | ORAL | Status: DC
  Administered 2021-02-07: 81 mg via ORAL
  Filled 2021-02-07: qty 1

## 2021-02-07 MED ORDER — OMEPRAZOLE 40 MG PO CPDR: 40.0000 mg | DELAYED_RELEASE_CAPSULE | Freq: Two times a day (BID) | ORAL | 3 refills | Status: DC

## 2021-02-07 MED ORDER — PANTOPRAZOLE SODIUM 40 MG IV SOLR
40.0000 mg | Freq: Two times a day (BID) | INTRAVENOUS | Status: DC
  Administered 2021-02-07: 40 mg via INTRAVENOUS
  Filled 2021-02-07: qty 40

## 2021-02-07 MED ORDER — ENOXAPARIN SODIUM 40 MG/0.4ML IJ SOSY
40.0000 mg | PREFILLED_SYRINGE | INTRAMUSCULAR | Status: DC
  Administered 2021-02-07: 40 mg via SUBCUTANEOUS
  Filled 2021-02-07: qty 0.4

## 2021-02-07 MED ORDER — THYROID 30 MG PO TABS
120.0000 mg | ORAL_TABLET | Freq: Every day | ORAL | Status: DC
  Administered 2021-02-07: 120 mg via ORAL
  Filled 2021-02-07: qty 4

## 2021-02-07 NOTE — Evaluation (Addendum)
Occupational Therapy Evaluation Patient Details Name: Evan Miller MRN: [id number] DOB: 1925-08-22 Today's Date: 02/07/2021    History of Present Illness Evan Miller is a 87 y.o. male with medical history significant for hypothyroidism, GERD, arthritis and history of Schatzki's ring who presented to the ED due to food impaction which occurred yesterday (8/30) around noon.  Patient states that while eating chicken around 12 PM, he felt that the food became stuck tto the esophagus.  Patient states that he had a similar situation in April of this year and he underwent an upper endoscopy.  A balloon dilation for Schatzki ring was dilated up to 82m at that time with no disruption and cold forceps were used to further distraught during at that time gastroenterologist medical record.  Biopsy done at that time was negative for eosinophilic esophagitis.   Clinical Impression   Pt pleasant and agreeable to OT evaluation. Pt demonstrated good bed mobility with independence. Pt able to don shoes seated at EOB with mod I level of assist. Pt demonstrates limitations in B shoulder A/ROM but was able to complete grooming standing without difficulty. Pt required Min G to min A for initial sit to stand from EOB due to loss of balance, but subsequent toilet transfer was completed with SPV assist with pt using grab bat. All further OT needs can be met at the next venue of care. Pt will be discharged to care of nursing staff for the remainder of his stay.     Follow Up Recommendations  Home health OT;Supervision - Intermittent;Other (comment) (SPV for transfers.)    Equipment Recommendations  None recommended by OT           Precautions / Restrictions Precautions Precautions: Fall Restrictions Weight Bearing Restrictions: No      Mobility Bed Mobility Overal bed mobility: Independent                  Transfers Overall transfer level: Needs assistance Equipment used: Rolling walker (2  wheeled) Transfers: Sit to/from SOmnicareSit to Stand: Min guard;Min assist Stand pivot transfers: Supervision;Min guard       General transfer comment: Pt required min A during intial sit to stand due to posterior loss of balance using RW from EOB. During toilet transfers pt demonstrated more SPV assist with no loss of balance.    Balance Overall balance assessment: Needs assistance Sitting-balance support: No upper extremity supported;Feet supported Sitting balance-Leahy Scale: Good Sitting balance - Comments: seated at EOB   Standing balance support: Bilateral upper extremity supported;During functional activity Standing balance-Leahy Scale: Fair Standing balance comment: using RW                           ADL either performed or assessed with clinical judgement   ADL Overall ADL's : Needs assistance/impaired     Grooming: Supervision/safety;Wash/dry face;Standing Grooming Details (indicate cue type and reason): Pt completed grooming standing at sink with RW.             Lower Body Dressing: Modified independent;Sitting/lateral leans Lower Body Dressing Details (indicate cue type and reason): Mod I to don shoes at EFirst Data CorporationTransfer: Supervision/safety;RW;Ambulation TArmed forces technical officerDetails (indicate cue type and reason): Pt able to complete ambulatory transfer to toilet from standing at sink with SPV using RW.                 Vision Baseline Vision/History: 1 Wears glasses Ability to See in  Adequate Light: 0 Adequate Patient Visual Report: No change from baseline                  Pertinent Vitals/Pain Pain Assessment: No/denies pain     Hand Dominance Right   Extremity/Trunk Assessment Upper Extremity Assessment Upper Extremity Assessment: RUE deficits/detail;LUE deficits/detail RUE Deficits / Details: Pt demonstrated R UE abduction limitation to ~90* A/ROM. WFL shoudler flexion A/ROM. 4+/5 MMT elbow flexion/extension  and grip strength. RUE Sensation: WNL RUE Coordination: WNL LUE Deficits / Details: Limited to ~110* shoulder flexion A/ROM. P/ROM with the same limitation. 4+/5 MMT elbow flexion/extension and grip strength. LUE Sensation: WNL LUE Coordination: WNL   Lower Extremity Assessment Lower Extremity Assessment: Defer to PT evaluation   Cervical / Trunk Assessment Cervical / Trunk Assessment: Kyphotic   Communication Communication Communication: HOH   Cognition Arousal/Alertness: Awake/alert Behavior During Therapy: WFL for tasks assessed/performed Overall Cognitive Status: Within Functional Limits for tasks assessed                                                      Home Living Family/patient expects to be discharged to:: Private residence Living Arrangements: Children Available Help at Discharge: Available 24 hours/day;Family Type of Home: House Home Access: Stairs to enter [employer] of Steps: 3 Entrance Stairs-Rails: Right;Left;Can reach both Home Layout: Two level Alternate Level Stairs-Number of Steps: 12 Alternate Level Stairs-Rails: Right;Left;Can reach both Bathroom Shower/Tub: Occupational psychologist: Standard Bathroom Accessibility: Yes How Accessible: Accessible via walker Home Equipment: Walker - 2 wheels;Cane - single point;Bedside commode;Grab bars - tub/shower;Shower seat;Wheelchair - manual   Additional Comments: History per pt report.      Prior Functioning/Environment Level of Independence: Needs assistance  Gait / Transfers Assistance Needed: Household and community ambulator with RW. ADL's / Homemaking Assistance Needed: Independent for ADL's; assisted by family for IADL's                              AM-PAC OT "6 Clicks" Daily Activity     Outcome Measure Help from another person eating meals?: None Help from another person taking care of personal grooming?: A Little Help from another person  toileting, which includes using toliet, bedpan, or urinal?: A Little Help from another person bathing (including washing, rinsing, drying)?: A Little Help from another person to put on and taking off regular upper body clothing?: None Help from another person to put on and taking off regular lower body clothing?: None 6 Click Score: 21   End of Session Equipment Utilized During Treatment: Rolling walker  Activity Tolerance: Patient tolerated treatment well Patient left: in bed;with call bell/phone within reach;with bed alarm set  OT Visit Diagnosis: Unsteadiness on feet (R26.81);Muscle weakness (generalized) (M62.81)                Time: 9628-3662 OT Time Calculation (min): 20 min Charges:  OT General Charges $OT Visit: 1 Visit OT Evaluation $OT Eval Low Complexity: 1 Low  Davarion Cuffee OT, MOT  Larey Seat 02/07/2021, 12:16 PM

## 2021-02-07 NOTE — Anesthesia Postprocedure Evaluation (Signed)
Anesthesia Post Note  Patient: ANJELO AMANI  Procedure(s) Performed: ESOPHAGOGASTRODUODENOSCOPY (EGD) WITH PROPOFOL  Patient location during evaluation: PACU Anesthesia Type: General Level of consciousness: awake and alert and oriented Pain management: pain level controlled Vital Signs Assessment: post-procedure vital signs reviewed and stable Respiratory status: spontaneous breathing, respiratory function stable and patient connected to nasal cannula oxygen Cardiovascular status: blood pressure returned to baseline and stable Postop Assessment: no apparent nausea or vomiting Anesthetic complications: no   No notable events documented.   Last Vitals:  Vitals:   02/06/21 2259 02/07/21 0012  BP: 134/81 134/67  Pulse: 64 78  Resp: 16 17  Temp: 36.7 C 36.7 C  SpO2: 96% 98%    Last Pain:  Vitals:   02/07/21 0012  TempSrc:   PainSc: 0-No pain                 Blaze Nylund C Edgar Corrigan

## 2021-02-07 NOTE — Progress Notes (Signed)
Time 1345. Son present and was unaware that father was discharged home. Discharge handled by SWAT nurse and patient stated he had a ride home. His friend transported him home which concerned the son since he was unaware of this. Son stated he just wanted to know that he was safe.

## 2021-02-07 NOTE — Transfer of Care (Signed)
Immediate Anesthesia Transfer of Care Note  Patient: Evan Miller  Procedure(s) Performed: ESOPHAGOGASTRODUODENOSCOPY (EGD) WITH PROPOFOL  Patient Location: PACU  Anesthesia Type:General  Level of Consciousness: awake, alert , oriented and sedated  Airway & Oxygen Therapy: Patient Spontanous Breathing and Patient connected to nasal cannula oxygen  Post-op Assessment: Report given to RN and Post -op Vital signs reviewed and stable  Post vital signs: Reviewed and stable  Last Vitals:  Vitals Value Taken Time  BP 134/67 0015  Temp 98 0015  Pulse 80 0015  Resp 16 0015  SpO2 98 0015    Last Pain:  Vitals:   02/06/21 2259  TempSrc: Oral  PainSc:          Complications: No notable events documented.

## 2021-02-07 NOTE — Discharge Summary (Signed)
Physician Discharge Summary  Evan Miller [id number] DOB: Oct 20, 1925 DOA: 02/06/2021  PCP: Pcp, No  Admit date: 02/06/2021 Discharge date: 02/07/2021  Time spent: 45 minutes  Recommendations for Outpatient Follow-up:  Follow-up with gastroenterology for repeat upper endoscopy to be done in 4 weeks.  Office will call with appointment time for EGD appointment. Follow-up with PCP in 3 weeks.   Discharge Diagnoses:  Principal Problem:   Food impaction of esophagus, initial encounter Active Problems:   Hypothyroidism   Esophageal stricture   GERD (gastroesophageal reflux disease)   Discharge Condition: Stable and improved  Diet recommendation: Full liquid diet  Filed Weights   02/06/21 2005  Weight: 81.6 kg    History of present illness:  HPI per Dr. Lamar Sprinkles is a 87 y.o. male with medical history significant for hypothyroidism, GERD, arthritis and history of Schatzki's ring who presented to the ED due to food impaction which occurred yesterday (8/30) around noon.  Patient states that while eating chicken around 12 PM, he felt that the food became stuck tto the esophagus.  Patient stated that he had a similar situation in April of this year and he underwent an upper endoscopy.  A balloon dilation for Schatzki ring was dilated up to 21m at that time with no disruption and cold forceps were used to further distraught during at that time gastroenterologist medical record.  Biopsy done at that time was negative for eosinophilic esophagitis.   ED Course:  Vital signs on arrival to the hospital was stable.  Work-up showed normocytic anemia and normal BMP, except for mild elevation in CBG at 111.  Influenza A, B, SARS coronavirus was negative.  Patient underwent upper endoscopy due to food impaction under general anesthesia and patient tolerated the procedure.  He was taken to PACU after the procedure, however, it took a longer time for patient to recover and be fully  awake after the procedure, so, anesthesiologist consulted TRH to admit patient and observe overnight. At bedside, patient was in no acute distress, he was alert and oriented x3, son was at bedside.   Hospital Course:  #1 Food impaction of the lower third of the esophagus -Patient had presented to the ED with food impaction seen by GI underwent upper endoscopy which was successful which showed food impaction in the lower third of the esophagus status post removal as well as a benign esophageal stenosis. -Was recommended by anesthesia for patient to be admitted overnight for close observation. -Patient remained in stable condition and patient started on full liquid diet which he tolerated. -GI recommended full liquid diet for the next 4 weeks with repeat upper endoscopy to be done in 4 weeks. -GI also recommended Protonix twice daily x3 months. -Patient was discharged home in stable and improved condition on a full liquid diet with outpatient follow-up with PCP and GI.  2.  Hypothyroidism -Patient maintained on home regimen Synthroid.  The rest of patient's chronic medical issues remained stable throughout the hospitalization.  Procedures: EGD with removal of food debris per Dr.Castaneda Mayorga -02/06/2021  Consultations: Gastroenterology: Dr. CMontez Morita Discharge Exam: Vitals:   02/07/21 0404 02/07/21 0841  BP: 134/68 (!) 111/52  Pulse: 79 81  Resp: 19 18  Temp: 98.3 F (36.8 C) 97.6 F (36.4 C)  SpO2: 97% 99%    General: NAD Cardiovascular: RRR with 3/6 SEM Respiratory: Clear to auscultation bilaterally.  No wheezes, no crackles, no rhonchi.  Normal respiratory effort.  Discharge Instructions  Discharge Instructions     Diet full liquid   Complete by: As directed    Increase activity slowly   Complete by: As directed       Allergies as of 02/07/2021       Reactions   Ciprofloxacin Hives   Hives at IV infusion site   Penicillins Other (See Comments)    Reaction: large knots on head Has patient had a PCN reaction causing immediate rash, facial/tongue/throat swelling, SOB or lightheadedness with hypotension: No Has patient had a PCN reaction causing severe rash involving mucus membranes or skin necrosis: No Has patient had a PCN reaction that required hospitalization No Has patient had a PCN reaction occurring within the last 10 years: No If all of the above answers are "NO", then may proceed with Cephalosporin use.   Sulfa Antibiotics Hives        Medication List     TAKE these medications    aspirin 81 MG tablet Take 81 mg by mouth every morning.   cholecalciferol 25 MCG (1000 UNIT) tablet Commonly known as: VITAMIN D3 Take 1,000 Units by mouth daily.   NP Thyroid 120 MG tablet Generic drug: thyroid TAKE 1 TABLET(120 MG) BY MOUTH DAILY What changed:  how much to take how to take this when to take this additional instructions   omeprazole 40 MG capsule Commonly known as: PRILOSEC Take 1 capsule (40 mg total) by mouth 2 (two) times daily. What changed: when to take this   triamcinolone cream 0.1 % Commonly known as: KENALOG Apply 1 application topically 2 (two) times daily.       Allergies  Allergen Reactions   Ciprofloxacin Hives    Hives at IV infusion site   Penicillins Other (See Comments)    Reaction: large knots on head Has patient had a PCN reaction causing immediate rash, facial/tongue/throat swelling, SOB or lightheadedness with hypotension: No Has patient had a PCN reaction causing severe rash involving mucus membranes or skin necrosis: No Has patient had a PCN reaction that required hospitalization No Has patient had a PCN reaction occurring within the last 10 years: No If all of the above answers are "NO", then may proceed with Cephalosporin use.    Sulfa Antibiotics Hives    Follow-up Information     pcp. Schedule an appointment as soon as possible for a visit in 3 week(s).           Harvel Quale, MD Follow up.   Specialty: Gastroenterology Why: Office will call with appointment for REPEAT upper endoscopy to be done in 4 weeks. Contact information: [id number] [place] [address] [phone]                  The results of significant diagnostics from this hospitalization (including imaging, microbiology, ancillary and laboratory) are listed below for reference.    Significant Diagnostic Studies: No results found.  Microbiology: Recent Results (from the past 240 hour(s))  Resp Panel by RT-PCR (Flu A&B, Covid) Nasopharyngeal Swab     Status: None   Collection Time: 02/06/21  9:09 PM   Specimen: Nasopharyngeal Swab; Nasopharyngeal(NP) swabs in vial transport medium  Result Value Ref Range Status   SARS Coronavirus 2 by RT PCR NEGATIVE NEGATIVE Final    Comment: (NOTE) SARS-CoV-2 target nucleic acids are NOT DETECTED.  The SARS-CoV-2 RNA is generally detectable in upper respiratory specimens during the acute phase of infection. The lowest concentration of SARS-CoV-2 viral copies this assay  can detect is 138 copies/mL. A negative result does not preclude SARS-Cov-2 infection and should not be used as the sole basis for treatment or other patient management decisions. A negative result may occur with  improper specimen collection/handling, submission of specimen other than nasopharyngeal swab, presence of viral mutation(s) within the areas targeted by this assay, and inadequate number of viral copies(<138 copies/mL). A negative result must be combined with clinical observations, patient history, and epidemiological information. The expected result is Negative.  Fact Sheet for Patients:  EntrepreneurPulse.com.au  Fact Sheet for Healthcare Providers:  IncredibleEmployment.be  This test is no t yet approved or cleared by the [place] FDA and  has been authorized for detection  and/or diagnosis of SARS-CoV-2 by FDA under an Emergency Use Authorization (EUA). This EUA will remain  in effect (meaning this test can be used) for the duration of the COVID-19 declaration under Section 564(b)(1) of the Act, 21 U.S.C.section 360bbb-3(b)(1), unless the authorization is terminated  or revoked sooner.       Influenza A by PCR NEGATIVE NEGATIVE Final   Influenza B by PCR NEGATIVE NEGATIVE Final    Comment: (NOTE) The Xpert Xpress SARS-CoV-2/FLU/RSV plus assay is intended as an aid in the diagnosis of influenza from Nasopharyngeal swab specimens and should not be used as a sole basis for treatment. Nasal washings and aspirates are unacceptable for Xpert Xpress SARS-CoV-2/FLU/RSV testing.  Fact Sheet for Patients: EntrepreneurPulse.com.au  Fact Sheet for Healthcare Providers: IncredibleEmployment.be  This test is not yet approved or cleared by the [place] FDA and has been authorized for detection and/or diagnosis of SARS-CoV-2 by FDA under an Emergency Use Authorization (EUA). This EUA will remain in effect (meaning this test can be used) for the duration of the COVID-19 declaration under Section 564(b)(1) of the Act, 21 U.S.C. section 360bbb-3(b)(1), unless the authorization is terminated or revoked.  Performed at [facility], [address] [place], [place] [address]      Labs: Basic Metabolic Panel: Recent Labs  Lab 02/06/21 2020 02/07/21 0604  NA 138 140  K 4.1 4.2  CL 103 103  CO2 23 26  GLUCOSE 111* 149*  BUN 19 17  CREATININE 1.08 0.99  CALCIUM 8.7* 9.2  MG  --  2.3  PHOS  --  4.2   Liver Function Tests: Recent Labs  Lab 02/07/21 0604  AST 20  ALT 15  ALKPHOS 71  BILITOT 0.5  PROT 6.6  ALBUMIN 3.8   No results for input(s): LIPASE, AMYLASE in the last 168 hours. No results for input(s): AMMONIA in the last 168 hours. CBC: Recent Labs  Lab 02/06/21 2020 02/07/21 0604  WBC 6.3 10.6*   NEUTROABS 4.3  --   HGB 12.2* 12.2*  HCT 37.2* 37.3*  MCV 92.3 92.1  PLT 213 211   Cardiac Enzymes: No results for input(s): CKTOTAL, CKMB, CKMBINDEX, TROPONINI in the last 168 hours. BNP: BNP (last 3 results) No results for input(s): BNP in the last 8760 hours.  ProBNP (last 3 results) No results for input(s): PROBNP in the last 8760 hours.  CBG: No results for input(s): GLUCAP in the last 168 hours.     Signed:  Irine Seal MD.  [facility] 02/07/2021, 11:48 AM

## 2021-02-07 NOTE — Evaluation (Signed)
Physical Therapy Evaluation Patient Details Name: Evan Miller MRN: [id number] DOB: September 12, 1925 Today's Date: 02/07/2021   History of Present Illness  Evan Miller is a 87 y.o. male with medical history significant for hypothyroidism, GERD, arthritis and history of Schatzki's ring who presented to the ED due to food impaction which occurred yesterday (8/30) around noon.  Patient states that while eating chicken around 12 PM, he felt that the food became stuck tto the esophagus.  Patient states that he had a similar situation in April of this year and he underwent an upper endoscopy.  A balloon dilation for Schatzki ring was dilated up to 64m at that time with no disruption and cold forceps were used to further distraught during at that time gastroenterologist medical record.  Biopsy done at that time was negative for eosinophilic esophagitis.   Clinical Impression  Patient functioning at baseline for functional mobility and gait with good return for ambulation in room and hallway without loss of balance, on room air with SpO2 dropping from 95% to 89%, averaged above 90%, HR between 90-115 BPM and tolerated sitting up in chair after therapy with SpO2 increasing to 98% on room air - nursing staff notified.  Plan:  Patient discharged from physical therapy to care of nursing for ambulation daily as tolerated for length of stay.      Follow Up Recommendations No PT follow up;Supervision for mobility/OOB;Supervision - Intermittent    Equipment Recommendations  None recommended by PT    Recommendations for Other Services       Precautions / Restrictions Precautions Precautions: None Restrictions Weight Bearing Restrictions: No      Mobility  Bed Mobility Overal bed mobility: Independent                  Transfers Overall transfer level: Modified independent Equipment used: Rolling walker (2 wheeled) Transfers: Sit to/from SOmnicareSit to Stand: Min  guard;Min assist Stand pivot transfers: Supervision;Min guard       General transfer comment: Pt required min A during intial sit to stand due to posterior loss of balance using RW from EOB. During toilet transfers pt demonstrated more SPV assist with no loss of balance.  Ambulation/Gait Ambulation/Gait assistance: Modified independent (Device/Increase time) Gait Distance (Feet): 100 Feet Assistive device: None Gait Pattern/deviations: Decreased step length - right;Decreased step length - left;Decreased stride length Gait velocity: decreased   General Gait Details: slightly labored cadence without loss of balance demonstrating good return for ambulation in room and hallway  Stairs            Wheelchair Mobility    Modified Rankin (Stroke Patients Only)       Balance Overall balance assessment: No apparent balance deficits (not formally assessed) Sitting-balance support: No upper extremity supported;Feet supported Sitting balance-Leahy Scale: Good Sitting balance - Comments: seated at EOB   Standing balance support: Bilateral upper extremity supported;During functional activity Standing balance-Leahy Scale: Fair Standing balance comment: using RW                             Pertinent Vitals/Pain Pain Assessment: No/denies pain    Home Living Family/patient expects to be discharged to:: Private residence Living Arrangements: Children Available Help at Discharge: Available 24 hours/day;Family Type of Home: House Home Access: Stairs to enter Entrance Stairs-Rails: Right;Left;Can reach both Entrance Stairs-Number of Steps: 3 Home Layout: Two level Home Equipment: Walker - 2 wheels;Cane - single point;Bedside commode;Grab  bars - tub/shower;Shower seat;Wheelchair - manual Additional Comments: History per pt report.    Prior Function Level of Independence: Needs assistance   Gait / Transfers Assistance Needed: Household and community ambulator with  RW.  ADL's / Homemaking Assistance Needed: Independent for ADL's; assisted by family for IADL's        Hand Dominance   Dominant Hand: Right    Extremity/Trunk Assessment   Upper Extremity Assessment Upper Extremity Assessment: Defer to OT evaluation RUE Deficits / Details: Pt demonstrated R UE abduction limitation to ~90* A/ROM. WFL shoudler flexion A/ROM. 4+/5 MMT elbow flexion/extension and grip strength. RUE Sensation: WNL RUE Coordination: WNL LUE Deficits / Details: Limited to ~110* shoulder flexion A/ROM. P/ROM with the same limitation. 4+/5 MMT elbow flexion/extension and grip strength. LUE Sensation: WNL LUE Coordination: WNL    Lower Extremity Assessment Lower Extremity Assessment: Overall WFL for tasks assessed    Cervical / Trunk Assessment Cervical / Trunk Assessment: Kyphotic  Communication   Communication: HOH  Cognition Arousal/Alertness: Awake/alert Behavior During Therapy: WFL for tasks assessed/performed Overall Cognitive Status: Within Functional Limits for tasks assessed                                        General Comments      Exercises     Assessment/Plan    PT Assessment Patent does not need any further PT services  PT Problem List         PT Treatment Interventions      PT Goals (Current goals can be found in the Care Plan section)  Acute Rehab PT Goals Patient Stated Goal: return home with family to assist PT Goal Formulation: With patient Time For Goal Achievement: 02/07/21 Potential to Achieve Goals: Good    Frequency     Barriers to discharge        Co-evaluation               AM-PAC PT "6 Clicks" Mobility  Outcome Measure Help needed turning from your back to your side while in a flat bed without using bedrails?: None Help needed moving from lying on your back to sitting on the side of a flat bed without using bedrails?: None Help needed moving to and from a bed to a chair (including a  wheelchair)?: None Help needed standing up from a chair using your arms (e.g., wheelchair or bedside chair)?: None Help needed to walk in hospital room?: None Help needed climbing 3-5 steps with a railing? : A Little 6 Click Score: 23    End of Session   Activity Tolerance: Patient tolerated treatment well;Patient limited by fatigue Patient left: in chair;with call bell/phone within reach Nurse Communication: Mobility status PT Visit Diagnosis: Unsteadiness on feet (R26.81);Other abnormalities of gait and mobility (R26.89);Muscle weakness (generalized) (M62.81)    Time: [id number] PT Time Calculation (min) (ACUTE ONLY): 26 min   Charges:   PT Evaluation $PT Eval Moderate Complexity: 1 Mod PT Treatments $Therapeutic Activity: 23-37 mins        12:38 PM, 02/07/21 Lonell Grandchild, MPT Physical Therapist with [facility] [phone] office [phone] mobile phone

## 2021-02-07 NOTE — H&P (Signed)
History and Physical  Evan Miller [id number] DOB: 1926-01-14 DOA: 02/06/2021  Referring physician:  Denese Killings, MD PCP: Pcp, No  Patient coming from: Home  Chief Complaint: Food impaction  HPI: Evan Miller is a 87 y.o. male with medical history significant for hypothyroidism, GERD, arthritis and history of Schatzki's ring who presented to the ED due to food impaction which occurred yesterday (8/30) around noon.  Patient states that while eating chicken around 12 PM, he felt that the food became stuck tto the esophagus.  Patient states that he had a similar situation in April of this year and he underwent an upper endoscopy.  A balloon dilation for Schatzki ring was dilated up to 19m at that time with no disruption and cold forceps were used to further distraught during at that time gastroenterologist medical record.  Biopsy done at that time was negative for eosinophilic esophagitis.  ED Course:  Vital signs on arrival to the hospital was stable.  Work-up showed normocytic anemia and normal BMP, except for mild elevation in CBG at 111.  Influenza A, B, SARS coronavirus was negative.  Patient underwent upper endoscopy due to food impaction under general anesthesia and patient tolerated the procedure.  He was taken to PACU after the procedure, however, it took a longer time for patient to recover and be fully awake after the procedure, so, anesthesiologist consulted TRH to admit patient and observe overnight. At bedside, patient was in no acute distress, he was alert and oriented x3, son was at bedside.  Review of Systems: Constitutional: Negative for chills and fever.  HENT: Negative for ear pain and sore throat.   Eyes: Negative for pain and visual disturbance.  Respiratory: Negative for cough, chest tightness and shortness of breath.   Cardiovascular: Negative for chest pain and palpitations.  Gastrointestinal: Negative for abdominal pain and vomiting.  Endocrine:  Negative for polyphagia and polyuria.  Genitourinary: Negative for decreased urine volume, dysuria, enuresis Musculoskeletal: Negative for arthralgias and back pain.  Skin: Negative for color change and rash.  Allergic/Immunologic: Negative for immunocompromised state.  Neurological: Negative for tremors, syncope, speech difficulty, Hematological: Does not bruise/bleed easily.  All other systems reviewed and are negative  Past Medical History:  Diagnosis Date   Arthritis    back,shoulders and hips   [place]    basal cell/ Melanoma 1997 left  shoulder/  PROSTATE   GERD (gastroesophageal reflux disease)    Hepatitis    age [age]years   Hypothyroidism    PONV (postoperative nausea and vomiting)    Prostate CA ([facility]    Past Surgical History:  Procedure Laterality Date   BIOPSY  09/08/2020   Procedure: BIOPSY;  Surgeon: CHarvel Quale MD;  [facility];  Service: Gastroenterology;;   CATARACT EXTRACTION W/PHACO  12/16/2011   Procedure: CATARACT EXTRACTION PHACO AND INTRAOCULAR LENS PLACEMENT ([place] Level;  Surgeon: KTonny Branch MD;  Location: [facility];  Service: Ophthalmology;  Laterality: Left;  CDE:17.24    CATARACT EXTRACTION W/PHACO  12/30/2011   Procedure: CATARACT EXTRACTION PHACO AND INTRAOCULAR LENS PLACEMENT (IOC);  Surgeon: KTonny Branch MD;  Location: [facility];  Service: Ophthalmology;  Laterality: Right;  CDE 15.40   COLONOSCOPY     COLONOSCOPY N/A 08/07/2012   Procedure: COLONOSCOPY;  Surgeon: NRogene Houston MD;  [facility];  Service: Endoscopy;  Laterality: N/A;  10:00   COLONOSCOPY N/A 11/22/2015   Procedure: COLONOSCOPY;  Surgeon: NRogene Houston MD;  [facility];  Service: Endoscopy;  Laterality: N/A;  12:00 - moved to 10:30 - Ann to notify   ESOPHAGEAL DILATION N/A 02/10/2015   Procedure: ESOPHAGEAL DILATION;  Surgeon: Rogene Houston, MD;  [facility];  Service: Endoscopy;  Laterality: N/A;   ESOPHAGEAL DILATION N/A  03/29/2015   Procedure: ESOPHAGEAL DILATION;  Surgeon: Rogene Houston, MD;  [facility];  Service: Endoscopy;  Laterality: N/A;   ESOPHAGEAL DILATION  11/22/2015   Procedure: ESOPHAGEAL DILATION;  Surgeon: Rogene Houston, MD;  [facility];  Service: Endoscopy;;   ESOPHAGEAL DILATION N/A 11/19/2017   Procedure: ESOPHAGEAL DILATION;  Surgeon: Rogene Houston, MD;  [facility];  Service: Endoscopy;  Laterality: N/A;   ESOPHAGEAL DILATION N/A 12/29/2017   Procedure: ESOPHAGEAL DILATION;  Surgeon: Rogene Houston, MD;  [facility];  Service: Endoscopy;  Laterality: N/A;   ESOPHAGEAL DILATION N/A 09/08/2020   Procedure: ESOPHAGEAL DILATION;  Surgeon: Harvel Quale, MD;  [facility];  Service: Gastroenterology;  Laterality: N/A;   ESOPHAGOGASTRODUODENOSCOPY N/A 02/10/2015   Procedure: ESOPHAGOGASTRODUODENOSCOPY (EGD);  Surgeon: Rogene Houston, MD;  [facility];  Service: Endoscopy;  Laterality: N/A;  925 - moved to 10:20 - Ann to notify pt   ESOPHAGOGASTRODUODENOSCOPY N/A 03/29/2015   Procedure: ESOPHAGOGASTRODUODENOSCOPY (EGD);  Surgeon: Rogene Houston, MD;  [facility];  Service: Endoscopy;  Laterality: N/A;  145 - moved to 8:30 - Ann notified pt   ESOPHAGOGASTRODUODENOSCOPY N/A 11/22/2015   Procedure: ESOPHAGOGASTRODUODENOSCOPY (EGD);  Surgeon: Rogene Houston, MD;  [facility];  Service: Endoscopy;  Laterality: N/A;   ESOPHAGOGASTRODUODENOSCOPY N/A 10/27/2017   Procedure: ESOPHAGOGASTRODUODENOSCOPY (EGD);  Surgeon: Daneil Dolin, MD;  [facility];  Service: Endoscopy;  Laterality: N/A;  removal of food impaction   ESOPHAGOGASTRODUODENOSCOPY N/A 11/19/2017   Procedure: ESOPHAGOGASTRODUODENOSCOPY (EGD);  Surgeon: Rogene Houston, MD;  [facility];  Service: Endoscopy;  Laterality: N/A;  7:30   ESOPHAGOGASTRODUODENOSCOPY N/A 12/29/2017   Procedure:  ESOPHAGOGASTRODUODENOSCOPY (EGD);  Surgeon: Rogene Houston, MD;  [facility];  Service: Endoscopy;  Laterality: N/A;  730   ESOPHAGOGASTRODUODENOSCOPY (EGD) WITH PROPOFOL N/A 09/08/2020   Procedure: ESOPHAGOGASTRODUODENOSCOPY (EGD) WITH PROPOFOL;  Surgeon: Harvel Quale, MD;  [facility];  Service: Gastroenterology;  Laterality: N/A;  am   HERNIA REPAIR     HYDROCELE EXCISION     bilateral   JOINT REPLACEMENT     left knee 9/12   MELANOMA EXCISION N/A 06/11/2017   Procedure: WIDE LOCAL EXCISION WITH ADVANCEMENT FLAP CLOSURE LEFT NECK MELANOMA;  Surgeon: Stark Klein, MD;  Location: [facility];  Service: General;  Laterality: N/A;  GENERAL AND LOCAL   TOTAL KNEE ARTHROPLASTY  06/24/2011   Procedure: TOTAL KNEE ARTHROPLASTY;  Surgeon: Gearlean Alf;  Location: [facility];  Service: Orthopedics;  Laterality: Right;    Social History:  reports that he quit smoking about 58 years ago. His smoking use included cigarettes. He has a 30.00 pack-year smoking history. He quit smokeless tobacco use about 14 years ago.  His smokeless tobacco use included chew. He reports that he does not drink alcohol and does not use drugs.   Allergies  Allergen Reactions   Ciprofloxacin Hives    Hives at IV infusion site   Penicillins Other (See Comments)    Reaction: large knots on head Has patient had a PCN reaction causing immediate  rash, facial/tongue/throat swelling, SOB or lightheadedness with hypotension: No Has patient had a PCN reaction causing severe rash involving mucus membranes or skin necrosis: No Has patient had a PCN reaction that required hospitalization No Has patient had a PCN reaction occurring within the last 10 years: No If all of the above answers are "NO", then may proceed with Cephalosporin use.    Sulfa Antibiotics Hives    Family History  Problem Relation Age of Onset   Breast cancer Mother    Prostate cancer Brother    Nephritis  Brother    Rectal cancer Brother    Cirrhosis Brother    Liver cancer Brother    Healthy Daughter    Healthy Son      Prior to Admission medications   Medication Sig Start Date End Date Taking? Authorizing Provider  aspirin 81 MG tablet Take 81 mg by mouth every morning.  01/01/18   Rehman, Mechele Dawley, MD  cholecalciferol (VITAMIN D3) 25 MCG (1000 UNIT) tablet Take 1,000 Units by mouth daily.    [provider]  NP THYROID 120 MG tablet TAKE 1 TABLET(120 MG) BY MOUTH DAILY 11/02/20   Hurshel Party C, MD  omeprazole (PRILOSEC) 40 MG capsule Take 40 mg by mouth daily.    [provider]  triamcinolone (KENALOG) 0.1 % Apply 1 application topically 2 (two) times daily. 05/02/20   Doree Albee, MD    Physical Exam: BP (!) 127/95   Pulse 79   Temp 98 F (36.7 C)   Resp 19   Ht '5\' 1"'$  (1.549 m)   Wt 81.6 kg   SpO2 92%   BMI 33.99 kg/m   General: 87 y.o. year-old male well developed well nourished in no acute distress.  Alert and oriented x3. HEENT: NCAT, EOMI Neck: Supple, trachea medial Cardiovascular: Regular rate and rhythm with no rubs or gallops.  No thyromegaly or JVD noted.  No lower extremity edema. 2/4 pulses in all 4 extremities. Respiratory: Clear to auscultation with no wheezes or rales. Good inspiratory effort. Abdomen: Soft, nontender nondistended with normal bowel sounds x4 quadrants. Muskuloskeletal: No cyanosis, clubbing or edema noted bilaterally Neuro: CN II-XII intact, strength 5/5 x 4, sensation, reflexes intact Skin: No ulcerative lesions noted or rashes Psychiatry: Judgement and insight appear normal. Mood is appropriate for condition and setting          Labs on Admission:  Basic Metabolic Panel: Recent Labs  Lab 02/06/21 2020  NA 138  K 4.1  CL 103  CO2 23  GLUCOSE 111*  BUN 19  CREATININE 1.08  CALCIUM 8.7*   Liver Function Tests: No results for input(s): AST, ALT, ALKPHOS, BILITOT, PROT, ALBUMIN in the last 168  hours. No results for input(s): LIPASE, AMYLASE in the last 168 hours. No results for input(s): AMMONIA in the last 168 hours. CBC: Recent Labs  Lab 02/06/21 2020  WBC 6.3  NEUTROABS 4.3  HGB 12.2*  HCT 37.2*  MCV 92.3  PLT 213   Cardiac Enzymes: No results for input(s): CKTOTAL, CKMB, CKMBINDEX, TROPONINI in the last 168 hours.  BNP (last 3 results) No results for input(s): BNP in the last 8760 hours.  ProBNP (last 3 results) No results for input(s): PROBNP in the last 8760 hours.  CBG: No results for input(s): GLUCAP in the last 168 hours.  Radiological Exams on Admission: No results found.  EKG: I independently viewed the EKG done and my findings are as followed: EKG was not done in  the PACU  Assessment/Plan Present on Admission:  Food impaction of esophagus, initial encounter  Hypothyroidism  Active Problems:   Hypothyroidism   Food impaction of esophagus, initial encounter   GERD (gastroesophageal reflux disease)  Food impaction status post EGD GERD Full liquid diet for the next 4 weeks per gastroenterology recommendation Continue Protonix p.o. twice daily Patient will be observed overnight to ensure persistent hemodynamic stability  Hypothyroidism Continue home meds  DVT prophylaxis: Lovenox  Code Status: Full code  Family Communication: Son at bedside.  (All questions answered to satisfaction)  Disposition Plan:  Patient is from:                        home Anticipated DC to:                   SNF or family members home Anticipated DC date:               2-3 days Anticipated DC barriers:          Patient requires overnight observation status post EGD under general anesthesia per anesthesiologist request   Consults called: Anesthesiologist  Admission status: Observation    Bernadette Hoit MD [facility]  02/07/2021, 1:37 AM

## 2021-02-07 NOTE — Op Note (Addendum)
[facility] Patient Name: Evan Miller Procedure Date: 02/06/2021 10:43 PM MRN: [id number] Date of Birth: 1925/10/12 Attending MD: [name] ,  CSN: [id number] Age: 87 Admit Type: Inpatient Procedure:                Upper GI endoscopy Indications:              Foreign body in the esophagus Providers:                [name], Lurline Del, RN, Casimer Bilis, Technician Referring MD:              Medicines:                General Anesthesia Complications:            No immediate complications. Estimated Blood Loss:     Estimated blood loss: none. Procedure:                Pre-Anesthesia Assessment:                           - Prior to the procedure, a History and Physical                            was performed, and patient medications, allergies                            and sensitivities were reviewed. The patient's                            tolerance of previous anesthesia was reviewed.                           - The risks and benefits of the procedure and the                            sedation options and risks were discussed with the                            patient. All questions were answered and informed                            consent was obtained.                           - ASA Grade Assessment: III - A patient with severe                            systemic disease.                           After obtaining informed consent, the endoscope was                            passed under direct vision. Throughout the  procedure, the patient's blood pressure, pulse, and                            oxygen saturations were monitored continuously.The                            upper GI endoscopy was accomplished without                            difficulty. The patient tolerated the procedure                            well. The [id number] scope was introduced                             through the mouth, and advanced to the second part                            of duodenum. Scope In: 11:13:25 PM Scope Out: 11:57:23 PM Total Procedure Duration: 0 hours 43 minutes 58 seconds  Findings:      Food was found in the lower third of the esophagus. Removal of food was       accomplished with the use of a transparent cap, Roth net and rat tooth       forceps. There was some associated inflammation in the area where the       food was located.      One benign-appearing, intrinsic moderate (circumferential scarring or       stenosis; an endoscope may pass) stenosis was found at the       gastroesophageal junction. This stenosis measured 1 mm (inner diameter)       x 10 cm (in length). The stenosis was traversed after removing the       transparent cap.      The entire examined stomach was normal.      The examined duodenum was normal. Impression:               - Food in the lower third of the esophagus. Removal                            was successful.                           - Benign-appearing esophageal stenosis.                           - Normal stomach.                           - Normal examined duodenum. Moderate Sedation:      Per Anesthesia Care Recommendation:           - Full liquid diet for the next 4 weeks. Can try                            eating ice but do not try advancing diet further.                           -  Use Prilosec (omeprazole) 40 mg PO BID for 3                            months. Please open capsule and swallow granules.                           - Admit the patient to hospital ward for                            observation per anesthesiologist request (Dr.                            Charna Elizabeth requested observation until the morning).                           - Repeat upper endoscopy in 1 month for                            surveillance. Procedure Code(s):        --- Professional ---                           [id number],  Esophagogastroduodenoscopy, flexible,                            transoral; with removal of foreign body(s) Diagnosis Code(s):        --- Professional ---                           [id number], Food in esophagus causing other injury,                            initial encounter                           K22.2, Esophageal obstruction                           T18.108A, Unspecified foreign body in esophagus                            causing other injury, initial encounter CPT copyright 2019 [employer]. All rights reserved. The codes documented in this report are preliminary and upon coder review may  be revised to meet current compliance requirements. [name], MD [name],  02/07/2021 12:05:32 AM This report has been signed electronically. Number of Addenda: 0

## 2021-02-07 NOTE — Progress Notes (Signed)
Physical therapy informed this writing while ambulating patient oxygen stat was at 91% on RA. MD Grandville Silos aware. New orders given.

## 2021-02-07 NOTE — Brief Op Note (Signed)
02/06/2021  12:06 AM  PATIENT:  Evan Miller  87 y.o. male  PRE-OPERATIVE DIAGNOSIS:  food impaction  POST-OPERATIVE DIAGNOSIS:  food debris in esophagus;  PROCEDURE:  Procedure(s): ESOPHAGOGASTRODUODENOSCOPY (EGD) WITH PROPOFOL (N/A)  SURGEON:  Surgeon(s) and Role:    * Harvel Quale, MD - Primary  Patient underwent EGD under general anesthesia.  Tolerated the procedure well.  Food was found in the lower third of the esophagus.  Removal of food was accomplished with the use of a transparent cap, Roth net and rat tooth forceps. There was some associated inflammation in the area where the food was located. One benign-appearing, intrinsic moderate (circumferential scarring or stenosis; an endoscope may pass) stenosis was found at the gastroesophageal junction.  This stenosis measured 1 mm (inner diameter) x 10 cm (in length).  The stenosis was traversed after removing the transparent cap.  Stomach and duodenum were within normal limits.  RECOMMENDATIONS: - Full liquid diet for the next 4 weeks. Can try eating ice but do not try advancing diet further. - Use Prilosec (omeprazole) 40 mg PO BID for 3 months. Please open capsule and swallow granules. - Admit the patient to hospital ward for observation per anesthesiologist request (Dr. Charna Elizabeth requested observation until the morning).  - Repeat upper endoscopy in 1 month for surveillance.   [name], MD Gastroenterology and Hepatology [facility]

## 2021-02-07 NOTE — Progress Notes (Signed)
Discharge instructions provided to patient and friend who is at bedside. Both had no further questions.

## 2021-02-09 ENCOUNTER — Encounter (HOSPITAL_COMMUNITY): Payer: Self-pay | Admitting: Gastroenterology

## 2021-02-15 ENCOUNTER — Other Ambulatory Visit (INDEPENDENT_AMBULATORY_CARE_PROVIDER_SITE_OTHER): Payer: Self-pay

## 2021-02-15 DIAGNOSIS — K222 Esophageal obstruction: Secondary | ICD-10-CM

## 2021-02-16 ENCOUNTER — Encounter (INDEPENDENT_AMBULATORY_CARE_PROVIDER_SITE_OTHER): Payer: Self-pay

## 2021-03-13 DIAGNOSIS — K219 Gastro-esophageal reflux disease without esophagitis: Secondary | ICD-10-CM | POA: Diagnosis not present

## 2021-03-13 DIAGNOSIS — M1990 Unspecified osteoarthritis, unspecified site: Secondary | ICD-10-CM | POA: Diagnosis not present

## 2021-03-13 DIAGNOSIS — Z85820 Personal history of malignant melanoma of skin: Secondary | ICD-10-CM | POA: Diagnosis not present

## 2021-03-13 DIAGNOSIS — Z87891 Personal history of nicotine dependence: Secondary | ICD-10-CM | POA: Diagnosis not present

## 2021-03-13 DIAGNOSIS — E039 Hypothyroidism, unspecified: Secondary | ICD-10-CM | POA: Diagnosis not present

## 2021-03-13 DIAGNOSIS — R011 Cardiac murmur, unspecified: Secondary | ICD-10-CM | POA: Diagnosis not present

## 2021-03-14 NOTE — Patient Instructions (Signed)
Evan Miller  03/14/2021     @PREFPERIOPPHARMACY @   Your procedure is scheduled on  03/20/2021.   Report to [facility] at  0900 A.M.   Call this number if you have problems the morning of surgery:  [phone]   Remember:  Follow the diet and prep instructions given to you by the office.    Take these medicines the morning of surgery with A SIP OF WATER                  NPThyroid, prilosec.     Do not wear jewelry, make-up or nail polish.  Do not wear lotions, powders, or perfumes, or deodorant.  Do not shave 48 hours prior to surgery.  Men may shave face and neck.  Do not bring valuables to the hospital.  [facility] is not responsible for any belongings or valuables.  Contacts, dentures or bridgework may not be worn into surgery.  Leave your suitcase in the car.  After surgery it may be brought to your room.  For patients admitted to the hospital, discharge time will be determined by your treatment team.  Patients discharged the day of surgery will not be allowed to drive home and must have someone with them for 24 hours.    Special instructions:   DO NOT smoke tobacco or vape fore 24 hours before your procedure.  Please read over the following fact sheets that you were given. Anesthesia Post-op Instructions and Care and Recovery After Surgery      Upper Endoscopy, Adult, Care After This sheet gives you information about how to care for yourself after your procedure. Your health care provider may also give you more specific instructions. If you have problems or questions, contact your health care provider. What can I expect after the procedure? After the procedure, it is common to have: A sore throat. Mild stomach pain or discomfort. Bloating. Nausea. Follow these instructions at home:  Follow instructions from your health care provider about what to eat or drink after your procedure. Return to your normal activities as told by your health  care provider. Ask your health care provider what activities are safe for you. Take over-the-counter and prescription medicines only as told by your health care provider. If you were given a sedative during the procedure, it can affect you for several hours. Do not drive or operate machinery until your health care provider says that it is safe. Keep all follow-up visits as told by your health care provider. This is important. Contact a health care provider if you have: A sore throat that lasts longer than one day. Trouble swallowing. Get help right away if: You vomit blood or your vomit looks like coffee grounds. You have: A fever. Bloody, black, or tarry stools. A severe sore throat or you cannot swallow. Difficulty breathing. Severe pain in your chest or abdomen. Summary After the procedure, it is common to have a sore throat, mild stomach discomfort, bloating, and nausea. If you were given a sedative during the procedure, it can affect you for several hours. Do not drive or operate machinery until your health care provider says that it is safe. Follow instructions from your health care provider about what to eat or drink after your procedure. Return to your normal activities as told by your health care provider. This information is not intended to replace advice given to you by your health care provider. Make sure you discuss  any questions you have with your health care provider. Document Revised: 05/25/2019 Document Reviewed: 10/27/2017 Elsevier Patient Education  2022 [employer]. Esophageal Dilatation Esophageal dilatation, also called esophageal dilation, is a procedure to widen or open a blocked or narrowed part of the esophagus. The esophagus is the part of the body that moves food and liquid from the mouth to the stomach. You may need this procedure if: You have a buildup of scar tissue in your esophagus that makes it difficult, painful, or impossible to swallow. This can be  caused by gastroesophageal reflux disease (GERD). You have cancer of the esophagus. There is a problem with how food moves through your esophagus. In some cases, you may need this procedure repeated at a later time to dilate the esophagus gradually. Tell a health care provider about: Any allergies you have. All medicines you are taking, including vitamins, herbs, eye drops, creams, and over-the-counter medicines. Any problems you or family members have had with anesthetic medicines. Any blood disorders you have. Any surgeries you have had. Any medical conditions you have. Any antibiotic medicines you are required to take before dental procedures. Whether you are pregnant or may be pregnant. What are the risks? Generally, this is a safe procedure. However, problems may occur, including: Bleeding due to a tear in the lining of the esophagus. A hole, or perforation, in the esophagus. What happens before the procedure? Ask your health care provider about: Changing or stopping your regular medicines. This is especially important if you are taking diabetes medicines or blood thinners. Taking medicines such as aspirin and ibuprofen. These medicines can thin your blood. Do not take these medicines unless your health care provider tells you to take them. Taking over-the-counter medicines, vitamins, herbs, and supplements. Follow instructions from your health care provider about eating or drinking restrictions. Plan to have a responsible adult take you home from the hospital or clinic. Plan to have a responsible adult care for you for the time you are told after you leave the hospital or clinic. This is important. What happens during the procedure? You may be given a medicine to help you relax (sedative). A numbing medicine may be sprayed into the back of your throat, or you may gargle the medicine. Your health care provider may perform the dilatation using various surgical instruments, such  as: Simple dilators. This instrument is carefully placed in the esophagus to stretch it. Guided wire bougies. This involves using an endoscope to insert a wire into the esophagus. A dilator is passed over this wire to enlarge the esophagus. Then the wire is removed. Balloon dilators. An endoscope with a small balloon is inserted into the esophagus. The balloon is inflated to stretch the esophagus and open it up. The procedure may vary among health care providers and hospitals. What can I expect after the procedure? Your blood pressure, heart rate, breathing rate, and blood oxygen level will be monitored until you leave the hospital or clinic. Your throat may feel slightly sore and numb. This will get better over time. You will not be allowed to eat or drink until your throat is no longer numb. When you are able to drink, urinate, and sit on the edge of the bed without nausea or dizziness, you may be able to return home. Follow these instructions at home: Take over-the-counter and prescription medicines only as told by your health care provider. If you were given a sedative during the procedure, it can affect you for several hours. Do not  drive or operate machinery until your health care provider says that it is safe. Plan to have a responsible adult care for you for the time you are told. This is important. Follow instructions from your health care provider about any eating or drinking restrictions. Do not use any products that contain nicotine or tobacco, such as cigarettes, e-cigarettes, and chewing tobacco. If you need help quitting, ask your health care provider. Keep all follow-up visits. This is important. Contact a health care provider if: You have a fever. You have pain that is not relieved by medicine. Get help right away if: You have chest pain. You have trouble breathing. You have trouble swallowing. You vomit blood. You have black, tarry, or bloody stools. These symptoms may  represent a serious problem that is an emergency. Do not wait to see if the symptoms will go away. Get medical help right away. Call your local emergency services (911 in the [place] Do not drive yourself to the hospital. Summary Esophageal dilatation, also called esophageal dilation, is a procedure to widen or open a blocked or narrowed part of the esophagus. Plan to have a responsible adult take you home from the hospital or clinic. For this procedure, a numbing medicine may be sprayed into the back of your throat, or you may gargle the medicine. Do not drive or operate machinery until your health care provider says that it is safe. This information is not intended to replace advice given to you by your health care provider. Make sure you discuss any questions you have with your health care provider. Document Revised: 10/13/2019 Document Reviewed: 10/13/2019 Elsevier Patient Education  [place] After This sheet gives you information about how to care for yourself after your procedure. Your health care provider may also give you more specific instructions. If you have problems or questions, contact your health care provider. What can I expect after the procedure? After the procedure, it is common to have: Tiredness. Forgetfulness about what happened after the procedure. Impaired judgment for important decisions. Nausea or vomiting. Some difficulty with balance. Follow these instructions at home: For the time period you were told by your health care provider:   Rest as needed. Do not participate in activities where you could fall or become injured. Do not drive or use machinery. Do not drink alcohol. Do not take sleeping pills or medicines that cause drowsiness. Do not make important decisions or sign legal documents. Do not take care of children on your own. Eating and drinking Follow the diet that is recommended by your health care  provider. Drink enough fluid to keep your urine pale yellow. If you vomit: Drink water, juice, or soup when you can drink without vomiting. Make sure you have little or no nausea before eating solid foods. General instructions Have a responsible adult stay with you for the time you are told. It is important to have someone help care for you until you are awake and alert. Take over-the-counter and prescription medicines only as told by your health care provider. If you have sleep apnea, surgery and certain medicines can increase your risk for breathing problems. Follow instructions from your health care provider about wearing your sleep device: Anytime you are sleeping, including during daytime naps. While taking prescription pain medicines, sleeping medicines, or medicines that make you drowsy. Avoid smoking. Keep all follow-up visits as told by your health care provider. This is important. Contact a health care provider if: You keep feeling  nauseous or you keep vomiting. You feel light-headed. You are still sleepy or having trouble with balance after 24 hours. You develop a rash. You have a fever. You have redness or swelling around the IV site. Get help right away if: You have trouble breathing. You have new-onset confusion at home. Summary For several hours after your procedure, you may feel tired. You may also be forgetful and have poor judgment. Have a responsible adult stay with you for the time you are told. It is important to have someone help care for you until you are awake and alert. Rest as told. Do not drive or operate machinery. Do not drink alcohol or take sleeping pills. Get help right away if you have trouble breathing, or if you suddenly become confused. This information is not intended to replace advice given to you by your health care provider. Make sure you discuss any questions you have with your health care provider. Document Revised: 02/10/2020 Document Reviewed:  04/29/2019 Elsevier Patient Education  2022 [employer].

## 2021-03-16 ENCOUNTER — Other Ambulatory Visit: Payer: Self-pay

## 2021-03-16 ENCOUNTER — Encounter (HOSPITAL_COMMUNITY): Payer: Self-pay

## 2021-03-16 ENCOUNTER — Encounter (HOSPITAL_COMMUNITY)
Admission: RE | Admit: 2021-03-16 | Discharge: 2021-03-16 | Disposition: A | Discharge status: Home / Self Care | Payer: Medicare Other | Source: Ambulatory Visit | Attending: Gastroenterology | Admitting: Gastroenterology

## 2021-03-16 NOTE — Pre-Procedure Instructions (Signed)
Attempted to phone cal for pre-op. Patient is very [place] and asked me to call his daughter, Stanton Kidney. I called Mary at [phone] and left her a message to call me back.

## 2021-03-20 ENCOUNTER — Ambulatory Visit (HOSPITAL_COMMUNITY)
Admission: RE | Admit: 2021-03-20 | Discharge: 2021-03-20 | Disposition: A | Discharge status: Home / Self Care | Payer: Medicare Other | Source: Ambulatory Visit | Attending: Gastroenterology | Admitting: Gastroenterology

## 2021-03-20 ENCOUNTER — Encounter (HOSPITAL_COMMUNITY): Payer: Self-pay | Admitting: Gastroenterology

## 2021-03-20 ENCOUNTER — Encounter (HOSPITAL_COMMUNITY)
Admission: RE | Disposition: A | Discharge status: Home / Self Care | Payer: Self-pay | Source: Ambulatory Visit | Attending: Gastroenterology

## 2021-03-20 ENCOUNTER — Ambulatory Visit (HOSPITAL_COMMUNITY): Payer: Medicare Other | Admitting: Anesthesiology

## 2021-03-20 ENCOUNTER — Other Ambulatory Visit: Payer: Self-pay

## 2021-03-20 DIAGNOSIS — Z79899 Other long term (current) drug therapy: Secondary | ICD-10-CM | POA: Insufficient documentation

## 2021-03-20 DIAGNOSIS — M19011 Primary osteoarthritis, right shoulder: Secondary | ICD-10-CM | POA: Insufficient documentation

## 2021-03-20 DIAGNOSIS — M160 Bilateral primary osteoarthritis of hip: Secondary | ICD-10-CM | POA: Insufficient documentation

## 2021-03-20 DIAGNOSIS — M19012 Primary osteoarthritis, left shoulder: Secondary | ICD-10-CM | POA: Diagnosis not present

## 2021-03-20 DIAGNOSIS — K219 Gastro-esophageal reflux disease without esophagitis: Secondary | ICD-10-CM | POA: Diagnosis not present

## 2021-03-20 DIAGNOSIS — K222 Esophageal obstruction: Secondary | ICD-10-CM | POA: Insufficient documentation

## 2021-03-20 DIAGNOSIS — E039 Hypothyroidism, unspecified: Secondary | ICD-10-CM | POA: Diagnosis not present

## 2021-03-20 DIAGNOSIS — Z87891 Personal history of nicotine dependence: Secondary | ICD-10-CM | POA: Diagnosis not present

## 2021-03-20 HISTORY — PX: BIOPSY: SHX5522

## 2021-03-20 HISTORY — PX: ESOPHAGOGASTRODUODENOSCOPY (EGD) WITH PROPOFOL: SHX5813

## 2021-03-20 HISTORY — PX: ESOPHAGEAL DILATION: SHX303

## 2021-03-20 SURGERY — ESOPHAGOGASTRODUODENOSCOPY (EGD) WITH PROPOFOL
Anesthesia: General

## 2021-03-20 MED ORDER — LIDOCAINE HCL (CARDIAC) PF 100 MG/5ML IV SOSY
PREFILLED_SYRINGE | INTRAVENOUS | Status: DC | PRN
  Administered 2021-03-20: 80 mg via INTRAVENOUS

## 2021-03-20 MED ORDER — LACTATED RINGERS IV SOLN: INTRAVENOUS | Status: DC

## 2021-03-20 MED ORDER — STERILE WATER FOR IRRIGATION IR SOLN
Status: DC | PRN
  Administered 2021-03-20: 100 mL

## 2021-03-20 MED ORDER — PROPOFOL 10 MG/ML IV BOLUS
INTRAVENOUS | Status: DC | PRN
  Administered 2021-03-20: 40 mg via INTRAVENOUS
  Administered 2021-03-20: 30 mg via INTRAVENOUS
  Administered 2021-03-20 (×2): 20 mg via INTRAVENOUS

## 2021-03-20 NOTE — Discharge Instructions (Signed)
You are being discharged to home.  Advance your diet as tolerated.  Continue your present medications, then decrease omperazole to once a day.

## 2021-03-20 NOTE — Op Note (Addendum)
[facility] Patient Name: Evan Miller Procedure Date: 03/20/2021 10:42 AM MRN: [id number] Date of Birth: April 01, 1926 Attending MD: [name] ,  CSN: [id number] Age: 87 Admit Type: Outpatient Procedure:                Upper GI endoscopy Indications:              For therapy of esophageal stricture Providers:                [name], Lambert Mody, Randa Spike, Technician Referring MD:              Medicines:                Monitored Anesthesia Care Complications:            No immediate complications. Estimated Blood Loss:     Estimated blood loss: none. Procedure:                Pre-Anesthesia Assessment:                           - Prior to the procedure, a History and Physical                            was performed, and patient medications, allergies                            and sensitivities were reviewed. The patient's                            tolerance of previous anesthesia was reviewed.                           - The risks and benefits of the procedure and the                            sedation options and risks were discussed with the                            patient. All questions were answered and informed                            consent was obtained.                           - ASA Grade Assessment: III - A patient with severe                            systemic disease.                           After obtaining informed consent, the endoscope was                            passed under direct vision. Throughout the  procedure, the patient's blood pressure, pulse, and                            oxygen saturations were monitored continuously. The                            GIF-H190 ([id number]) scope was introduced through the                            mouth, and advanced to the second part of duodenum.                            The upper GI endoscopy was accomplished without                             difficulty. The patient tolerated the procedure                            well. Scope In: 10:52:14 AM Scope Out: 11:00:46 AM Total Procedure Duration: 0 hours 8 minutes 32 seconds  Findings:      A non-obstructing and moderate Schatzki ring was found at the       gastroesophageal junction. Initial disruption of the ring was performed       with a cold forceps. A TTS dilator was passed through the scope.       Dilation with a 15-16.5-18 mm balloon dilator was performed to 18 mm.       Mucosal disruption was present upon reinspection.      The entire examined stomach was normal.      The examined duodenum was normal. Impression:               - Non-obstructing and moderate Schatzki ring.                            Dilated.                           - Normal stomach.                           - Normal examined duodenum.                           - No specimens collected. Moderate Sedation:      Per Anesthesia Care Recommendation:           - Discharge patient to home (ambulatory).                           - Advance diet as tolerated.                           - Continue present medications, then decrease                            omperazole to once a day. Procedure Code(s):        --- Professional ---  [phone], Esophagogastroduodenoscopy, flexible,                            transoral; with transendoscopic balloon dilation of                            esophagus (less than 30 mm diameter) Diagnosis Code(s):        --- Professional ---                           K22.2, Esophageal obstruction CPT copyright 2019 American Medical [employer]. All rights reserved. The codes documented in this report are preliminary and upon coder review may  be revised to meet current compliance requirements. [name], MD [name],  03/20/2021 11:08:09 AM This report has been signed electronically. Number of Addenda: 0

## 2021-03-20 NOTE — H&P (Signed)
Evan Miller is an 87 y.o. male.   Chief Complaint: Dysphagia, h/o GEJ stricture. HPI: 87 year old M with history of GERD, arthritis, hypothyroidism, history of Schatzki's ring , coming to the hospital for evaluation after episode of food impaction.  The patient underwent an emergent EGD on 02/06/2021 after he an episode of food impaction after eating chicken.  He underwent an emergent EGD after coming to the ER at [facility] on 02/07/2021.  Has removal of food and was found to have moderate stenosis at the GE junction which was not dilated.  He was started on Prilosec 40 mg twice a day and was kept on liquid diet.  The patient reports that he has been advancing his diet but has been very careful to not eat anything that may get stuck.  He is eating solids at the moment.  Past Medical History:  Diagnosis Date   Arthritis    back,shoulders and hips   Cancer ([place])    basal cell/ Melanoma 1997 left  shoulder/  PROSTATE   GERD (gastroesophageal reflux disease)    Hepatitis    age 19 years   Hypothyroidism    PONV (postoperative nausea and vomiting)    Prostate CA [facility])     Past Surgical History:  Procedure Laterality Date   BIOPSY  09/08/2020   Procedure: BIOPSY;  Surgeon: Harvel Quale, MD;  [facility];  Service: Gastroenterology;;   CATARACT EXTRACTION W/PHACO  12/16/2011   Procedure: CATARACT EXTRACTION PHACO AND INTRAOCULAR LENS PLACEMENT ([place]);  Surgeon: Tonny Branch, MD;  Location: [facility];  Service: Ophthalmology;  Laterality: Left;  CDE:17.24    CATARACT EXTRACTION W/PHACO  12/30/2011   Procedure: CATARACT EXTRACTION PHACO AND INTRAOCULAR LENS PLACEMENT ([facility]);  Surgeon: Tonny Branch, MD;  Location: [facility];  Service: Ophthalmology;  Laterality: Right;  CDE 15.40   COLONOSCOPY     COLONOSCOPY N/A 08/07/2012   Procedure: COLONOSCOPY;  Surgeon: Rogene Houston, MD;  [facility];  Service: Endoscopy;  Laterality: N/A;  10:00   COLONOSCOPY N/A  11/22/2015   Procedure: COLONOSCOPY;  Surgeon: Rogene Houston, MD;  [facility];  Service: Endoscopy;  Laterality: N/A;  12:00 - moved to 10:30 - Ann to notify   ESOPHAGEAL DILATION N/A 02/10/2015   Procedure: ESOPHAGEAL DILATION;  Surgeon: Rogene Houston, MD;  [facility];  Service: Endoscopy;  Laterality: N/A;   ESOPHAGEAL DILATION N/A 03/29/2015   Procedure: ESOPHAGEAL DILATION;  Surgeon: Rogene Houston, MD;  [facility];  Service: Endoscopy;  Laterality: N/A;   ESOPHAGEAL DILATION  11/22/2015   Procedure: ESOPHAGEAL DILATION;  Surgeon: Rogene Houston, MD;  [facility];  Service: Endoscopy;;   ESOPHAGEAL DILATION N/A 11/19/2017   Procedure: ESOPHAGEAL DILATION;  Surgeon: Rogene Houston, MD;  [facility];  Service: Endoscopy;  Laterality: N/A;   ESOPHAGEAL DILATION N/A 12/29/2017   Procedure: ESOPHAGEAL DILATION;  Surgeon: Rogene Houston, MD;  [facility];  Service: Endoscopy;  Laterality: N/A;   ESOPHAGEAL DILATION N/A 09/08/2020   Procedure: ESOPHAGEAL DILATION;  Surgeon: Harvel Quale, MD;  [facility];  Service: Gastroenterology;  Laterality: N/A;   ESOPHAGOGASTRODUODENOSCOPY N/A 02/10/2015   Procedure: ESOPHAGOGASTRODUODENOSCOPY (EGD);  Surgeon: Rogene Houston, MD;  [facility];  Service: Endoscopy;  Laterality: N/A;  925 - moved to 10:20 - Ann to notify pt   ESOPHAGOGASTRODUODENOSCOPY N/A 03/29/2015   Procedure:  ESOPHAGOGASTRODUODENOSCOPY (EGD);  Surgeon: Rogene Houston, MD;  [facility];  Service: Endoscopy;  Laterality: N/A;  145 - moved to 8:30 - Ann notified pt   ESOPHAGOGASTRODUODENOSCOPY N/A 11/22/2015   Procedure: ESOPHAGOGASTRODUODENOSCOPY (EGD);  Surgeon: Rogene Houston, MD;  [facility];  Service: Endoscopy;  Laterality: N/A;   ESOPHAGOGASTRODUODENOSCOPY N/A 10/27/2017   Procedure: ESOPHAGOGASTRODUODENOSCOPY (EGD);  Surgeon: Daneil Dolin, MD;   [facility];  Service: Endoscopy;  Laterality: N/A;  removal of food impaction   ESOPHAGOGASTRODUODENOSCOPY N/A 11/19/2017   Procedure: ESOPHAGOGASTRODUODENOSCOPY (EGD);  Surgeon: Rogene Houston, MD;  [facility];  Service: Endoscopy;  Laterality: N/A;  7:30   ESOPHAGOGASTRODUODENOSCOPY N/A 12/29/2017   Procedure: ESOPHAGOGASTRODUODENOSCOPY (EGD);  Surgeon: Rogene Houston, MD;  [facility];  Service: Endoscopy;  Laterality: N/A;  730   ESOPHAGOGASTRODUODENOSCOPY (EGD) WITH PROPOFOL N/A 09/08/2020   Procedure: ESOPHAGOGASTRODUODENOSCOPY (EGD) WITH PROPOFOL;  Surgeon: Harvel Quale, MD;  [facility];  Service: Gastroenterology;  Laterality: N/A;  am   ESOPHAGOGASTRODUODENOSCOPY (EGD) WITH PROPOFOL N/A 02/06/2021   Procedure: ESOPHAGOGASTRODUODENOSCOPY (EGD) WITH PROPOFOL;  Surgeon: Harvel Quale, MD;  [facility];  Service: Gastroenterology;  Laterality: N/A;   HERNIA REPAIR     HYDROCELE EXCISION     bilateral   JOINT REPLACEMENT     left knee 9/12   MELANOMA EXCISION N/A 06/11/2017   Procedure: WIDE LOCAL EXCISION WITH ADVANCEMENT FLAP CLOSURE LEFT NECK MELANOMA;  Surgeon: Stark Klein, MD;  Location: [facility];  Service: General;  Laterality: N/A;  GENERAL AND LOCAL   TOTAL KNEE ARTHROPLASTY  06/24/2011   Procedure: TOTAL KNEE ARTHROPLASTY;  Surgeon: Gearlean Alf;  Location: [facility];  Service: Orthopedics;  Laterality: Right;    Family History  Problem Relation Age of Onset   Breast cancer Mother    Prostate cancer Brother    Nephritis Brother    Rectal cancer Brother    Cirrhosis Brother    Liver cancer Brother    Healthy Daughter    Healthy Son    Social History:  reports that he quit smoking about 58 years ago. His smoking use included cigarettes. He has a 30.00 pack-year smoking history. He quit smokeless tobacco use about 14 years ago.  His smokeless tobacco use included chew. He  reports that he does not drink alcohol and does not use drugs.  Allergies:  Allergies  Allergen Reactions   Ciprofloxacin Hives    Hives at IV infusion site   Penicillins Other (See Comments)    Reaction: large knots on head Has patient had a PCN reaction causing immediate rash, facial/tongue/throat swelling, SOB or lightheadedness with hypotension: No Has patient had a PCN reaction causing severe rash involving mucus membranes or skin necrosis: No Has patient had a PCN reaction that required hospitalization No Has patient had a PCN reaction occurring within the last 10 years: No If all of the above answers are "NO", then may proceed with Cephalosporin use.    Sulfa Antibiotics Hives    Medications Prior to Admission  Medication Sig Dispense Refill   NP THYROID 120 MG tablet TAKE 1 TABLET(120 MG) BY MOUTH DAILY (Patient taking differently: Take 120 mg by mouth daily at 6 (six) AM.) 30 tablet 3   omeprazole (PRILOSEC) 40 MG capsule Take 1 capsule (40 mg total) by mouth 2 (two) times daily. 60 capsule 3   triamcinolone (KENALOG) 0.1 % Apply 1 application  topically 2 (two) times daily. (Patient not taking: No sig reported) 30 g 0    No results found for this or any previous visit (from the past 48 hour(s)). No results found.  Review of Systems  Constitutional: Negative.   HENT:  Positive for trouble swallowing.   Eyes: Negative.   Respiratory: Negative.    Cardiovascular: Negative.   Gastrointestinal: Negative.   Endocrine: Negative.   Genitourinary: Negative.   Musculoskeletal: Negative.   Skin: Negative.   Allergic/Immunologic: Negative.   Neurological: Negative.   Hematological: Negative.   Psychiatric/Behavioral: Negative.     Blood pressure (!) 145/67, pulse 63, temperature 97.9 F (36.6 C), temperature source Oral, resp. rate 16, SpO2 99 %. Physical Exam  GENERAL: The patient is AO x3, in no acute distress. Elder. HEENT: Head is normocephalic and atraumatic. EOMI  are intact. Mouth is well hydrated and without lesions. NECK: Supple. No masses LUNGS: Clear to auscultation. No presence of rhonchi/wheezing/rales. Adequate chest expansion HEART: RRR, normal s1 and s2. ABDOMEN: Soft, nontender, no guarding, no peritoneal signs, and nondistended. BS +. No masses. EXTREMITIES: Without any cyanosis, clubbing, rash, lesions or edema. NEUROLOGIC: AOx3, no focal motor deficit. SKIN: no jaundice, no rashes  Assessment/Plan 87 year old M with history of GERD, arthritis, hypothyroidism, history of Schatzki's ring , coming to the hospital for evaluation after episode of food impaction.  The patient was found to have a stricture at the GE junction.  We will proceed with EGD with possible dilation.  Harvel Quale, MD 03/20/2021, 10:39 AM

## 2021-03-20 NOTE — Anesthesia Preprocedure Evaluation (Signed)
Anesthesia Evaluation  Patient identified by MRN, date of birth, ID band Patient awake    Reviewed: Allergy & Precautions, NPO status , Patient's Chart, lab work & pertinent test results  History of Anesthesia Complications (+) PONV and history of anesthetic complications  Airway Mallampati: II  TM Distance: >3 FB Neck ROM: Full    Dental  (+) Edentulous Upper, Edentulous Lower   Pulmonary former smoker,    Pulmonary exam normal breath sounds clear to auscultation       Cardiovascular Exercise Tolerance: Good + Valvular Problems/Murmurs  Rhythm:Regular Rate:Normal + Systolic murmurs    Neuro/Psych negative neurological ROS  negative psych ROS   GI/Hepatic GERD  Medicated and Controlled,(+) Hepatitis -  Endo/Other  Hypothyroidism   Renal/GU negative Renal ROS     Musculoskeletal  (+) Arthritis , Osteoarthritis,    Abdominal   Peds  Hematology   Anesthesia Other Findings Prostate cancer  Reproductive/Obstetrics                            Anesthesia Physical  Anesthesia Plan  ASA: 3  Anesthesia Plan: General   Post-op Pain Management:    Induction: Intravenous  PONV Risk Score and Plan: TIVA  Airway Management Planned: Nasal Cannula and Natural Airway  Additional Equipment:   Intra-op Plan:   Post-operative Plan:   Informed Consent: I have reviewed the patients History and Physical, chart, labs and discussed the procedure including the risks, benefits and alternatives for the proposed anesthesia with the patient or authorized representative who has indicated his/her understanding and acceptance.     Dental advisory given  Plan Discussed with: Surgeon and CRNA  Anesthesia Plan Comments:         Anesthesia Quick Evaluation

## 2021-03-20 NOTE — Anesthesia Postprocedure Evaluation (Signed)
Anesthesia Post Note  Patient: Evan Miller  Procedure(s) Performed: ESOPHAGOGASTRODUODENOSCOPY (EGD) WITH PROPOFOL ESOPHAGEAL DILATION BIOPSY  Patient location during evaluation: Phase II Anesthesia Type: General Level of consciousness: awake and alert and oriented Pain management: pain level controlled Vital Signs Assessment: post-procedure vital signs reviewed and stable Respiratory status: spontaneous breathing and respiratory function stable Cardiovascular status: blood pressure returned to baseline and stable Postop Assessment: no apparent nausea or vomiting Anesthetic complications: no   No notable events documented.   Last Vitals:  Vitals:   03/20/21 1104 03/20/21 1105  BP: (!) 114/45   Pulse: 65   Resp: 16   Temp:  (!) 35.8 C  SpO2: 97%     Last Pain:  Vitals:   03/20/21 1105  TempSrc: Axillary  PainSc:                  Merced Hanners C Laylonie Marzec

## 2021-03-20 NOTE — Transfer of Care (Signed)
Immediate Anesthesia Transfer of Care Note  Patient: Evan Miller  Procedure(s) Performed: ESOPHAGOGASTRODUODENOSCOPY (EGD) WITH PROPOFOL ESOPHAGEAL DILATION  Patient Location: Short Stay  Anesthesia Type:General  Level of Consciousness: awake, alert , oriented and patient cooperative  Airway & Oxygen Therapy: Patient Spontanous Breathing  Post-op Assessment: Report given to RN, Post -op Vital signs reviewed and stable and Patient moving all extremities X 4  Post vital signs: Reviewed and stable  Last Vitals:  Vitals Value Taken Time  BP    Temp    Pulse    Resp    SpO2      Last Pain:  Vitals:   03/20/21 1047  TempSrc:   PainSc: 0-No pain         Complications: No notable events documented.

## 2021-03-23 ENCOUNTER — Encounter (HOSPITAL_COMMUNITY): Payer: Self-pay | Admitting: Gastroenterology

## 2021-03-26 ENCOUNTER — Ambulatory Visit: Payer: Medicare Other | Admitting: Diagnostic Neuroimaging

## 2021-03-26 ENCOUNTER — Encounter: Payer: Self-pay | Admitting: Diagnostic Neuroimaging

## 2021-03-26 VITALS — BP 154/79 | HR 64 | Ht 63.0 in | Wt 173.0 lb

## 2021-03-26 DIAGNOSIS — G309 Alzheimer's disease, unspecified: Secondary | ICD-10-CM

## 2021-03-26 DIAGNOSIS — F03A0 Unspecified dementia, mild, without behavioral disturbance, psychotic disturbance, mood disturbance, and anxiety: Secondary | ICD-10-CM | POA: Diagnosis not present

## 2021-03-26 NOTE — Progress Notes (Signed)
[facility]  PATIENT: Evan Miller DOB: 02-13-1926  REFERRING CLINICIAN: Doree Albee, MD HISTORY FROM: patient  REASON FOR VISIT: new consult    HISTORICAL  CHIEF COMPLAINT:  Chief Complaint  Patient presents with   Memory Loss    Rm 6 New Pt, sonHerbie Baltimore  MMSE 20    HISTORY OF PRESENT ILLNESS:   87 year old male here for evaluation of memory loss.  Patient has had 2 to 3 years of gradual onset progressive short-term memory loss and confusion.  He lives at his own home and his daughter has been living with him for past 3 years.  She does help him a little bit with some day-to-day activities.  Patient son lives in [place] and other daughter who is a Designer, jewellery lives in the area.  They are concerned about onset of dementia.  Patient's wife also had dementia in the past with years ago.  No anxiety or behavioral changes.  He is able to maintain his personal hygiene and bathroom issues.  He has a good appetite.  Sometimes he forgets that he has eaten and asks repeatedly about food.  Sometimes he will eat food and leave open containers in the pantry.  He is not able to drive anymore.  He admits that he is not able to live on his own.  Family is considering transitioning him to assisted living facility.   REVIEW OF SYSTEMS: Full 14 system review of systems performed and negative with exception of: as per HPI/  ALLERGIES: Allergies  Allergen Reactions   Ciprofloxacin Hives    Hives at IV infusion site   Penicillins Other (See Comments)    Reaction: large knots on head Has patient had a PCN reaction causing immediate rash, facial/tongue/throat swelling, SOB or lightheadedness with hypotension: No Has patient had a PCN reaction causing severe rash involving mucus membranes or skin necrosis: No Has patient had a PCN reaction that required hospitalization No Has patient had a PCN reaction occurring within the last 10 years: No If all of the above  answers are "NO", then may proceed with Cephalosporin use.    Sulfa Antibiotics Hives    HOME MEDICATIONS: Outpatient Medications Prior to Visit  Medication Sig Dispense Refill   NP THYROID 120 MG tablet TAKE 1 TABLET(120 MG) BY MOUTH DAILY (Patient taking differently: Take 120 mg by mouth daily at 6 (six) AM.) 30 tablet 3   omeprazole (PRILOSEC) 40 MG capsule Take 1 capsule (40 mg total) by mouth 2 (two) times daily. 60 capsule 3   triamcinolone (KENALOG) 0.1 % Apply 1 application topically 2 (two) times daily. (Patient not taking: No sig reported) 30 g 0   No facility-administered medications prior to visit.    PAST MEDICAL HISTORY: Past Medical History:  Diagnosis Date   Arthritis    back,shoulders and hips   Cancer ([place])    basal cell/ Melanoma 1997 left  shoulder/  PROSTATE   GERD (gastroesophageal reflux disease)    Hepatitis    age 19 years   Hypothyroidism    Memory loss    PONV (postoperative nausea and vomiting)    Prostate CA ([place])     PAST SURGICAL HISTORY: Past Surgical History:  Procedure Laterality Date   BIOPSY  09/08/2020   Procedure: BIOPSY;  Surgeon: Harvel Quale, MD;  [facility];  Service: Gastroenterology;;   BIOPSY  03/20/2021   Procedure: BIOPSY;  Surgeon: Harvel Quale, MD;  [facility];  Service: Gastroenterology;;   CATARACT EXTRACTION W/PHACO  12/16/2011   Procedure: CATARACT EXTRACTION PHACO AND INTRAOCULAR LENS PLACEMENT (IOC);  Surgeon: Tonny Branch, MD;  [facility]: [facility];  Service: Ophthalmology;  Laterality: Left;  CDE:17.24    CATARACT EXTRACTION W/PHACO  12/30/2011   Procedure: CATARACT EXTRACTION PHACO AND INTRAOCULAR LENS PLACEMENT (IOC);  Surgeon: Tonny Branch, MD;  [facility]: [facility];  Service: Ophthalmology;  Laterality: Right;  CDE 15.40   COLONOSCOPY     COLONOSCOPY N/A 08/07/2012   Procedure: COLONOSCOPY;  Surgeon: Rogene Houston, MD;  [facility];  Service: Endoscopy;  Laterality:  N/A;  10:00   COLONOSCOPY N/A 11/22/2015   Procedure: COLONOSCOPY;  Surgeon: Rogene Houston, MD;  [facility];  Service: Endoscopy;  Laterality: N/A;  12:00 - moved to 10:30 - Ann to notify   ESOPHAGEAL DILATION N/A 02/10/2015   Procedure: ESOPHAGEAL DILATION;  Surgeon: Rogene Houston, MD;  [facility];  Service: Endoscopy;  Laterality: N/A;   ESOPHAGEAL DILATION N/A 03/29/2015   Procedure: ESOPHAGEAL DILATION;  Surgeon: Rogene Houston, MD;  [facility];  Service: Endoscopy;  Laterality: N/A;   ESOPHAGEAL DILATION  11/22/2015   Procedure: ESOPHAGEAL DILATION;  Surgeon: Rogene Houston, MD;  [facility];  Service: Endoscopy;;   ESOPHAGEAL DILATION N/A 11/19/2017   Procedure: ESOPHAGEAL DILATION;  Surgeon: Rogene Houston, MD;  [facility];  Service: Endoscopy;  Laterality: N/A;   ESOPHAGEAL DILATION N/A 12/29/2017   Procedure: ESOPHAGEAL DILATION;  Surgeon: Rogene Houston, MD;  [facility];  Service: Endoscopy;  Laterality: N/A;   ESOPHAGEAL DILATION N/A 09/08/2020   Procedure: ESOPHAGEAL DILATION;  Surgeon: Harvel Quale, MD;  [facility];  Service: Gastroenterology;  Laterality: N/A;   ESOPHAGEAL DILATION N/A 03/20/2021   Procedure: ESOPHAGEAL DILATION;  Surgeon: Harvel Quale, MD;  [facility];  Service: Gastroenterology;  Laterality: N/A;   ESOPHAGOGASTRODUODENOSCOPY N/A 02/10/2015   Procedure: ESOPHAGOGASTRODUODENOSCOPY (EGD);  Surgeon: Rogene Houston, MD;  [facility];  Service: Endoscopy;  Laterality: N/A;  925 - moved to 10:20 - Ann to notify pt   ESOPHAGOGASTRODUODENOSCOPY N/A 03/29/2015   Procedure: ESOPHAGOGASTRODUODENOSCOPY (EGD);  Surgeon: Rogene Houston, MD;  [facility];  Service: Endoscopy;  Laterality: N/A;  145 - moved to 8:30 - Ann notified pt   ESOPHAGOGASTRODUODENOSCOPY N/A 11/22/2015   Procedure: ESOPHAGOGASTRODUODENOSCOPY (EGD);   Surgeon: Rogene Houston, MD;  [facility];  Service: Endoscopy;  Laterality: N/A;   ESOPHAGOGASTRODUODENOSCOPY N/A 10/27/2017   Procedure: ESOPHAGOGASTRODUODENOSCOPY (EGD);  Surgeon: Daneil Dolin, MD;  [facility];  Service: Endoscopy;  Laterality: N/A;  removal of food impaction   ESOPHAGOGASTRODUODENOSCOPY N/A 11/19/2017   Procedure: ESOPHAGOGASTRODUODENOSCOPY (EGD);  Surgeon: Rogene Houston, MD;  [facility];  Service: Endoscopy;  Laterality: N/A;  7:30   ESOPHAGOGASTRODUODENOSCOPY N/A 12/29/2017   Procedure: ESOPHAGOGASTRODUODENOSCOPY (EGD);  Surgeon: Rogene Houston, MD;  [facility];  Service: Endoscopy;  Laterality: N/A;  730   ESOPHAGOGASTRODUODENOSCOPY (EGD) WITH PROPOFOL N/A 09/08/2020   Procedure: ESOPHAGOGASTRODUODENOSCOPY (EGD) WITH PROPOFOL;  Surgeon: Harvel Quale, MD;  [facility];  Service: Gastroenterology;  Laterality: N/A;  am   ESOPHAGOGASTRODUODENOSCOPY (EGD) WITH PROPOFOL N/A 02/06/2021   Procedure: ESOPHAGOGASTRODUODENOSCOPY (EGD) WITH PROPOFOL;  Surgeon: Harvel Quale, MD;  [facility];  Service: Gastroenterology;  Laterality: N/A;   ESOPHAGOGASTRODUODENOSCOPY (EGD)  WITH PROPOFOL N/A 03/20/2021   Procedure: ESOPHAGOGASTRODUODENOSCOPY (EGD) WITH PROPOFOL;  Surgeon: Harvel Quale, MD;  [facility];  Service: Gastroenterology;  Laterality: N/A;  10:55   HERNIA REPAIR     HYDROCELE EXCISION     bilateral   JOINT REPLACEMENT     left knee 9/12   MELANOMA EXCISION N/A 06/11/2017   Procedure: WIDE LOCAL EXCISION WITH ADVANCEMENT FLAP CLOSURE LEFT NECK MELANOMA;  Surgeon: Stark Klein, MD;  Location: [place];  Service: General;  Laterality: N/A;  GENERAL AND LOCAL   TOTAL KNEE ARTHROPLASTY  06/24/2011   Procedure: TOTAL KNEE ARTHROPLASTY;  Surgeon: Gearlean Alf;  Location: [facility];  Service: Orthopedics;  Laterality: Right;    FAMILY  HISTORY: Family History  Problem Relation Age of Onset   Breast cancer Mother    Prostate cancer Brother    Nephritis Brother    Rectal cancer Brother    Cirrhosis Brother    Liver cancer Brother    Healthy Daughter    Healthy Son     SOCIAL HISTORY: Social History   Socioeconomic History   Marital status: Widowed    Spouse name: Not on file   Number of children: 3   Years of education: 8   Highest education level: 8th grade  Occupational History   Not on file  Tobacco Use   Smoking status: Former    Packs/day: 1.00    Years: 30.00    Pack years: 30.00    Types: Cigarettes    Quit date: 06/16/1962    Years since quitting: 58.8   Smokeless tobacco: Former    Types: Chew    Quit date: 06/16/2006  Vaping Use   Vaping Use: Never used  Substance and Sexual Activity   Alcohol use: No    Comment: quit  60 yrs ago   Drug use: No   Sexual activity: Yes    Birth control/protection: None  Other Topics Concern   Not on file  Social History Narrative   03/26/21 lives with daughter, Jenny Reichmann   Social Determinants of Health   Financial Resource Strain: Low Risk    Difficulty of Paying Living Expenses: Not hard at all  Food Insecurity: No Food Insecurity   Worried About Charity fundraiser in the Last Year: Never true   Arboriculturist in the Last Year: Never true  Transportation Needs: No Transportation Needs   Lack of Transportation (Medical): No   Lack of Transportation (Non-Medical): No  Physical Activity: Inactive   Days of Exercise per Week: 0 days   Minutes of Exercise per Session: 0 min  Stress: No Stress Concern Present   Feeling of Stress : Not at all  Social Connections: Moderately Isolated   Frequency of Communication with Friends and Family: More than three times a week   Frequency of Social Gatherings with Friends and Family: More than three times a week   Attends Religious Services: More than 4 times per year   Active Member of [employer] or Organizations: No    Attends Archivist Meetings: Never   Marital Status: Widowed  Human resources officer Violence: Not At Risk   Fear of Current or Ex-Partner: No   Emotionally Abused: No   Physically Abused: No   Sexually Abused: No     PHYSICAL EXAM  GENERAL EXAM/CONSTITUTIONAL: Vitals:  Vitals:   03/26/21 1019  BP: (!) 154/79  Pulse: 64  Weight: 173 lb (78.5 kg)  Height:  5\' 3"  (1.6 m)   Body mass index is 30.65 kg/m. Wt Readings from Last 3 Encounters:  03/26/21 173 lb (78.5 kg)  03/16/21 179 lb 14.3 oz (81.6 kg)  02/06/21 179 lb 14.3 oz (81.6 kg)   Patient is in no distress; well developed, nourished and groomed; neck is supple  CARDIOVASCULAR: Examination of carotid arteries is normal; no carotid bruits Regular rate and rhythm, no murmurs Examination of peripheral vascular system by observation and palpation is normal  EYES: Ophthalmoscopic exam of optic discs and posterior segments is normal; no papilledema or hemorrhages No results found.  MUSCULOSKELETAL: Gait, strength, tone, movements noted in Neurologic exam below  NEUROLOGIC: MENTAL STATUS:  MMSE - Mini Mental State Exam 03/26/2021 08/31/2020  Orientation to time 4 3  Orientation to Place 4 4  Registration 2 2  Attention/ Calculation 1 5  Recall 2 2  Language- name 2 objects 2 2  Language- repeat 0 1  Language- follow 3 step command 3 3  Language- read & follow direction 1 1  Write a sentence 1 0  Copy design 0 0  Total score 20 23   awake, alert, oriented to person, place and time decr memory decr attention and concentration language fluent, comprehension intact, naming intact fund of knowledge appropriate  CRANIAL NERVE:  2nd - no papilledema on fundoscopic exam 2nd, 3rd, 4th, 6th - pupils equal and reactive to light, visual fields full to confrontation, extraocular muscles intact, no nystagmus 5th - facial sensation symmetric 7th - facial strength symmetric 8th - hearing intact 9th - palate elevates  symmetrically, uvula midline 11th - shoulder shrug symmetric 12th - tongue protrusion midline  MOTOR:  normal bulk and tone, full strength in the BUE, BLE  SENSORY:  normal and symmetric to light touch, temperature, vibration  COORDINATION:  finger-nose-finger, fine finger movements normal  REFLEXES:  deep tendon reflexes TRACE and symmetric  GAIT/STATION:  narrow based gait    DIAGNOSTIC DATA (LABS, IMAGING, TESTING) - I reviewed patient records, labs, notes, testing and imaging myself where available.  Lab Results  Component Value Date   WBC 10.6 (H) 02/07/2021   HGB 12.2 (L) 02/07/2021   HCT 37.3 (L) 02/07/2021   MCV 92.1 02/07/2021   PLT 211 02/07/2021      Component Value Date/Time   NA 140 02/07/2021 0604   K 4.2 02/07/2021 0604   CL 103 02/07/2021 0604   CO2 26 02/07/2021 0604   GLUCOSE 149 (H) 02/07/2021 0604   BUN 17 02/07/2021 0604   CREATININE 0.99 02/07/2021 0604   CREATININE 1.29 (H) 12/25/2020 1317   CALCIUM 9.2 02/07/2021 0604   PROT 6.6 02/07/2021 0604   ALBUMIN 3.8 02/07/2021 0604   AST 20 02/07/2021 0604   ALT 15 02/07/2021 0604   ALKPHOS 71 02/07/2021 0604   BILITOT 0.5 02/07/2021 0604   GFRNONAA >60 02/07/2021 0604   GFRNONAA 59 (L) 08/24/2019 1101   GFRAA >60 09/27/2019 1438   GFRAA 68 08/24/2019 1101   No results found for: CHOL, HDL, LDLCALC, LDLDIRECT, TRIG, CHOLHDL No results found for: HGBA1C Lab Results  Component Value Date   VITAMINB12 368 12/25/2020   Lab Results  Component Value Date   TSH 61.44 (H) 12/25/2020       ASSESSMENT AND PLAN  87 y.o. year old male here with mild memory loss, short-term loss and confusion, slight decline in ADLs, most consistent with neurodegenerative dementia.  MMSE 20 out of 30.  Also has hypothyroidism, likely noncompliant  with medication.   Dx:  1. 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  Alzheimer's disease, unspecified (CODE) ([place])      PLAN:  MEMORY LOSS / DEMENTIA (mild) - check MRI brain - follow up TSH with PCP - safety / supervision issues reviewed - daily physical activity / exercise (at least 15-30 minutes) - eat more plants / vegetables - increase social activities, brain stimulation, games, puzzles, hobbies, crafts, arts, music - aim for at least 7-8 hours sleep per night (or more) - avoid smoking and alcohol - caregiver resources provided - caution with medications, finances; no driving  Orders Placed This Encounter  Procedures   MR BRAIN WO CONTRAST   Return for pending if symptoms worsen or fail to improve, pending test results.    Penni Bombard, MD 16/03/9603, 54:09 AM Certified in Neurology, Neurophysiology and Neuroimaging  [facility] [address], [place], [place] [address] [phone]

## 2021-03-26 NOTE — Patient Instructions (Signed)
  MEMORY LOSS / DEMENTIA (mild) - check MRI brain - safety / supervision issues reviewed - daily physical activity / exercise (at least 15-30 minutes) - eat more plants / vegetables - increase social activities, brain stimulation, games, puzzles, hobbies, crafts, arts, music - aim for at least 7-8 hours sleep per night (or more) - avoid smoking and alcohol - caregiver resources provided - caution with medications, finances; no driving

## 2021-03-30 DIAGNOSIS — Z139 Encounter for screening, unspecified: Secondary | ICD-10-CM | POA: Diagnosis not present

## 2021-03-30 DIAGNOSIS — Z13220 Encounter for screening for lipoid disorders: Secondary | ICD-10-CM | POA: Diagnosis not present

## 2021-03-30 DIAGNOSIS — E039 Hypothyroidism, unspecified: Secondary | ICD-10-CM | POA: Diagnosis not present

## 2021-04-09 ENCOUNTER — Ambulatory Visit (HOSPITAL_COMMUNITY)
Admission: RE | Admit: 2021-04-09 | Discharge: 2021-04-09 | Disposition: A | Discharge status: Home / Self Care | Payer: Medicare Other | Source: Ambulatory Visit | Attending: Diagnostic Neuroimaging | Admitting: Diagnostic Neuroimaging

## 2021-04-09 ENCOUNTER — Other Ambulatory Visit: Payer: Self-pay

## 2021-04-09 DIAGNOSIS — F03A0 Unspecified dementia, mild, without behavioral disturbance, psychotic disturbance, mood disturbance, and anxiety: Secondary | ICD-10-CM | POA: Insufficient documentation

## 2021-04-09 DIAGNOSIS — G309 Alzheimer's disease, unspecified: Secondary | ICD-10-CM | POA: Diagnosis not present

## 2021-04-09 DIAGNOSIS — G319 Degenerative disease of nervous system, unspecified: Secondary | ICD-10-CM | POA: Diagnosis not present

## 2021-04-12 DIAGNOSIS — E039 Hypothyroidism, unspecified: Secondary | ICD-10-CM | POA: Diagnosis not present

## 2021-04-12 DIAGNOSIS — K219 Gastro-esophageal reflux disease without esophagitis: Secondary | ICD-10-CM | POA: Diagnosis not present

## 2021-04-12 DIAGNOSIS — Z79899 Other long term (current) drug therapy: Secondary | ICD-10-CM | POA: Diagnosis not present

## 2021-06-01 ENCOUNTER — Telehealth: Payer: Medicare Other | Admitting: Physician Assistant

## 2021-06-01 DIAGNOSIS — U071 COVID-19: Secondary | ICD-10-CM | POA: Diagnosis not present

## 2021-06-01 MED ORDER — BENZONATATE 100 MG PO CAPS: 100.0000 mg | ORAL_CAPSULE | Freq: Three times a day (TID) | ORAL | 0 refills | Status: DC | PRN

## 2021-06-01 MED ORDER — NIRMATRELVIR/RITONAVIR (PAXLOVID) TABLET (RENAL DOSING): 2.0000 | ORAL_TABLET | Freq: Two times a day (BID) | ORAL | 0 refills | Status: AC

## 2021-06-01 NOTE — Progress Notes (Signed)
Virtual Visit Consent   CARRELL RAHMANI, you are scheduled for a virtual visit with a [facility] provider today.     Just as with appointments in the office, your consent must be obtained to participate.  Your consent will be active for this visit and any virtual visit you may have with one of our providers in the next 365 days.     If you have a MyChart account, a copy of this consent can be sent to you electronically.  All virtual visits are billed to your insurance company just like a traditional visit in the office.    As this is a virtual visit, video technology does not allow for your provider to perform a traditional examination.  This may limit your provider's ability to fully assess your condition.  If your provider identifies any concerns that need to be evaluated in person or the need to arrange testing (such as labs, EKG, etc.), we will make arrangements to do so.     Although advances in technology are sophisticated, we cannot ensure that it will always work on either your end or our end.  If the connection with a video visit is poor, the visit may have to be switched to a telephone visit.  With either a video or telephone visit, we are not always able to ensure that we have a secure connection.     I need to obtain your verbal consent now.   Are you willing to proceed with your visit today?    ARSH FEUTZ has provided verbal consent on 06/01/2021 for a virtual visit (video or telephone).   Mar Daring, PA-C   Date: 06/01/2021 3:30 PM   Virtual Visit via Video Note   I, Mar Daring, connected with  CASSANDRA MCMANAMAN  ([id number], [id number]) on 06/01/21 at  3:15 PM EST by a video-enabled telemedicine application and verified that I am speaking with the correct person using two identifiers.  Location: Patient: Virtual Visit Location Patient: Home Provider: Virtual Visit Location Provider: Home Office   I discussed the limitations of evaluation and  management by telemedicine and the availability of in person appointments. The patient expressed understanding and agreed to proceed.    Interactive audio and video communications were attempted, although failed due to patient's inability to connect to video. Continued visit with audio only interaction with patient agreement.   History of Present Illness: Evan Miller is a 87 y.o. who identifies as a male who was assigned male at birth, and is being seen today for Covid 45.  HPI: URI  This is a new problem. Episode onset: Tuesday symptoms started; tested positive today. The problem has been gradually worsening. Maximum temperature: unknown. Associated symptoms include congestion, coughing, diarrhea (yesterday) and sinus pain. Pertinent negatives include no chest pain, ear pain, headaches, nausea, plugged ear sensation, rhinorrhea, sore throat, vomiting or wheezing. Treatments tried: nyquil. The treatment provided mild relief.     Problems:  Patient Active Problem List   Diagnosis Date Noted   Food impaction of esophagus, initial encounter 02/07/2021   GERD (gastroesophageal reflux disease) 02/07/2021   Murmur 08/12/2019   Esophageal stricture 11/19/2017   Food impaction of esophagus 11/06/2017   Dysphagia 09/27/2015   History of colonic polyps 07/28/2012   Left sided abdominal pain 07/28/2012   Hypothyroidism 07/28/2012   OA (osteoarthritis) of knee 06/24/2011    Allergies:  Allergies  Allergen Reactions   Ciprofloxacin Hives    Hives at IV  infusion site   Penicillins Other (See Comments)    Reaction: large knots on head Has patient had a PCN reaction causing immediate rash, facial/tongue/throat swelling, SOB or lightheadedness with hypotension: No Has patient had a PCN reaction causing severe rash involving mucus membranes or skin necrosis: No Has patient had a PCN reaction that required hospitalization No Has patient had a PCN reaction occurring within the last 10 years:  No If all of the above answers are "NO", then may proceed with Cephalosporin use.    Sulfa Antibiotics Hives   Medications:  Current Outpatient Medications:    benzonatate (TESSALON) 100 MG capsule, Take 1 capsule (100 mg total) by mouth 3 (three) times daily as needed., Disp: 30 capsule, Rfl: 0   nirmatrelvir/ritonavir EUA, renal dosing, (PAXLOVID) 10 x 150 MG & 10 x 100MG  TABS, Take 2 tablets by mouth 2 (two) times daily for 5 days. (Take nirmatrelvir 150 mg one tablet twice daily for 5 days and ritonavir 100 mg one tablet twice daily for 5 days) Patient GFR is greater than 60, Disp: 20 tablet, Rfl: 0   NP THYROID 120 MG tablet, TAKE 1 TABLET(120 MG) BY MOUTH DAILY (Patient taking differently: Take 120 mg by mouth daily at 6 (six) AM.), Disp: 30 tablet, Rfl: 3   omeprazole (PRILOSEC) 40 MG capsule, Take 1 capsule (40 mg total) by mouth 2 (two) times daily., Disp: 60 capsule, Rfl: 3   triamcinolone (KENALOG) 0.1 %, Apply 1 application topically 2 (two) times daily. (Patient not taking: No sig reported), Disp: 30 g, Rfl: 0  Observations/Objective: Patient is well-developed, well-nourished in no acute distress.  Resting comfortably at home.  Head is normocephalic, atraumatic.  No labored breathing.  Speech is clear and coherent with logical content.  Patient is alert and oriented at baseline.    Assessment and Plan: 1. COVID-19 - nirmatrelvir/ritonavir EUA, renal dosing, (PAXLOVID) 10 x 150 MG & 10 x 100MG  TABS; Take 2 tablets by mouth 2 (two) times daily for 5 days. (Take nirmatrelvir 150 mg one tablet twice daily for 5 days and ritonavir 100 mg one tablet twice daily for 5 days) Patient GFR is greater than 60  Dispense: 20 tablet; Refill: 0 - benzonatate (TESSALON) 100 MG capsule; Take 1 capsule (100 mg total) by mouth 3 (three) times daily as needed.  Dispense: 30 capsule; Refill: 0  - Continue OTC symptomatic management of choice - Will send OTC vitamins and supplement information  through AVS - Paxlovid and tessalon prescribed - Patient enrolled in MyChart symptom monitoring - Push fluids - Rest as needed - Discussed return precautions and when to seek in-person evaluation, sent via AVS as well  Follow Up Instructions: I discussed the assessment and treatment plan with the patient. The patient was provided an opportunity to ask questions and all were answered. The patient agreed with the plan and demonstrated an understanding of the instructions.  A copy of instructions were sent to the patient via MyChart unless otherwise noted below.    The patient was advised to call back or seek an in-person evaluation if the symptoms worsen or if the condition fails to improve as anticipated.  Time:  I spent 25 minutes with the patient via telehealth technology discussing the above problems/concerns.    Mar Daring, PA-C

## 2021-06-01 NOTE — Patient Instructions (Addendum)
Evan Miller, thank you for joining Mar Daring, PA-C for today's virtual visit.  While this provider is not your primary care provider (PCP), if your PCP is located in our provider database this encounter information will be shared with them immediately following your visit.  Consent: (Patient) Evan Miller provided verbal consent for this virtual visit at the beginning of the encounter.  Current Medications:  Current Outpatient Medications:    benzonatate (TESSALON) 100 MG capsule, Take 1 capsule (100 mg total) by mouth 3 (three) times daily as needed., Disp: 30 capsule, Rfl: 0   nirmatrelvir/ritonavir EUA, renal dosing, (PAXLOVID) 10 x 150 MG & 10 x 100MG  TABS, Take 2 tablets by mouth 2 (two) times daily for 5 days. (Take nirmatrelvir 150 mg one tablet twice daily for 5 days and ritonavir 100 mg one tablet twice daily for 5 days) Patient GFR is greater than 60, Disp: 20 tablet, Rfl: 0   NP THYROID 120 MG tablet, TAKE 1 TABLET(120 MG) BY MOUTH DAILY (Patient taking differently: Take 120 mg by mouth daily at 6 (six) AM.), Disp: 30 tablet, Rfl: 3   omeprazole (PRILOSEC) 40 MG capsule, Take 1 capsule (40 mg total) by mouth 2 (two) times daily., Disp: 60 capsule, Rfl: 3   triamcinolone (KENALOG) 0.1 %, Apply 1 application topically 2 (two) times daily. (Patient not taking: No sig reported), Disp: 30 g, Rfl: 0   Medications ordered in this encounter:  Meds ordered this encounter  Medications   nirmatrelvir/ritonavir EUA, renal dosing, (PAXLOVID) 10 x 150 MG & 10 x 100MG  TABS    Sig: Take 2 tablets by mouth 2 (two) times daily for 5 days. (Take nirmatrelvir 150 mg one tablet twice daily for 5 days and ritonavir 100 mg one tablet twice daily for 5 days) Patient GFR is greater than 60    Dispense:  20 tablet    Refill:  0    Patient prefers delivery    Order Specific Question:   Supervising Provider    Answer:   Sabra Heck, BRIAN [[id number]]   benzonatate (TESSALON) 100 MG capsule     Sig: Take 1 capsule (100 mg total) by mouth 3 (three) times daily as needed.    Dispense:  30 capsule    Refill:  0    Order Specific Question:   Supervising Provider    Answer:   Sabra Heck, [place]     *If you need refills on other medications prior to your next appointment, please contact your pharmacy*  Follow-Up: Call back or seek an in-person evaluation if the symptoms worsen or if the condition fails to improve as anticipated.  Other Instructions 10 Things You Can Do to Manage Your COVID-19 Symptoms at Home If you have possible or confirmed COVID-19 Stay home except to get medical care. Monitor your symptoms carefully. If your symptoms get worse, call your healthcare provider immediately. Get rest and stay hydrated. If you have a medical appointment, call the healthcare provider ahead of time and tell them that you have or may have COVID-19. For medical emergencies, call 911 and notify the dispatch personnel that you have or may have COVID-19. Cover your cough and sneezes with a tissue or use the inside of your elbow. Wash your hands often with soap and water for at least 20 seconds or clean your hands with an alcohol-based hand sanitizer that contains at least 60% alcohol. As much as possible, stay in a specific room and away from other people in  your home. Also, you should use a separate bathroom, if available. If you need to be around other people in or outside of the home, wear a mask. Avoid sharing personal items with other people in your household, like dishes, towels, and bedding. Clean all surfaces that are touched often, like counters, tabletops, and doorknobs. Use household cleaning sprays or wipes according to the label instructions. [email or web address] 12/24/2019 This information is not intended to replace advice given to you by your health care provider. Make sure you discuss any questions you have with your health care provider. Document Revised: 02/16/2021 Document  Reviewed: 02/16/2021 Elsevier Patient Education  2022 [place]; Ritonavir Tablets What is this medication? NIRMATRELVIR; RITONAVIR (NIR ma TREL vir; ri TOE na veer) treats mild to moderate COVID-19. It may help people who are at high risk of developing severe illness. This medication works by limiting the spread of the virus in your body. The FDA has allowed the emergency use of this medication. This medicine may be used for other purposes; ask your health care provider or pharmacist if you have questions. COMMON BRAND NAME(S): PAXLOVID What should I tell my care team before I take this medication? They need to know if you have any of these conditions: Any allergies Any serious illness Kidney disease Liver disease An unusual or allergic reaction to nirmatrelvir, ritonavir, other medications, foods, dyes, or preservatives Pregnant or trying to get pregnant Breast-feeding How should I use this medication? This product contains 2 different medications that are packaged together. For the standard dose, take 2 pink tablets of nirmatrelvir with 1 white tablet of ritonavir (3 tablets total) by mouth with water twice daily. Talk to your care team if you have kidney disease. You may need a different dose. Swallow the tablets whole. You can take it with or without food. If it upsets your stomach, take it with food. Take all of this medication unless your care team tells you to stop it early. Keep taking it even if you think you are better. Talk to your care team about the use of this medication in children. While it may be prescribed for children as young as 12 years for selected conditions, precautions do apply. Overdosage: If you think you have taken too much of this medicine contact a poison control center or emergency room at once. NOTE: This medicine is only for you. Do not share this medicine with others. What if I miss a dose? If you miss a dose, take it as soon as you can  unless it is more than 8 hours late. If it is more than 8 hours late, skip the missed dose. Take the next dose at the normal time. Do not take extra or 2 doses at the same time to make up for the missed dose. What may interact with this medication? Do not take this medication with any of the following medications: Alfuzosin Certain medications for anxiety or sleep like midazolam, triazolam Certain medications for cancer like apalutamide, enzalutamide Certain medications for cholesterol like lovastatin, simvastatin Certain medications for irregular heart beat like amiodarone, dronedarone, flecainide, propafenone, quinidine Certain medications for pain like meperidine, piroxicam Certain medications for psychotic disorders like clozapine, lurasidone, pimozide Certain medications for seizures like carbamazepine, phenobarbital, phenytoin Colchicine Eletriptan Eplerenone Ergot alkaloids like dihydroergotamine, ergonovine, ergotamine, methylergonovine Finerenone Flibanserin Ivabradine Lomitapide Naloxegol Ranolazine Rifampin Sildenafil Silodosin St. John's Wort Tolvaptan Ubrogepant Voclosporin This medication may also interact with the following medications: Bedaquiline Birth control pills Bosentan Certain antibiotics  like erythromycin or clarithromycin Certain medications for blood pressure like amlodipine, diltiazem, felodipine, nicardipine, nifedipine Certain medications for cancer like abemaciclib, ceritinib, dasatinib, encorafenib, ibrutinib, ivosidenib, neratinib, nilotinib, venetoclax, vinblastine, vincristine Certain medications for cholesterol like atorvastatin, rosuvastatin Certain medications for depression like bupropion, trazodone Certain medications for fungal infections like isavuconazonium, itraconazole, ketoconazole, voriconazole Certain medications for hepatitis C like elbasvir; grazoprevir, dasabuvir; ombitasvir; paritaprevir; ritonavir, glecaprevir; pibrentasvir,  sofosbuvir; velpatasvir; voxilaprevir Certain medications for HIV or AIDS Certain medications for irregular heartbeat like lidocaine Certain medications that treat or prevent blood clots like rivaroxaban, warfarin Digoxin Fentanyl Medications that lower your chance of fighting infection like cyclosporine, sirolimus, tacrolimus Methadone Quetiapine Rifabutin Salmeterol Steroid medications like betamethasone, budesonide, ciclesonide, dexamethasone, fluticasone, methylprednisone, mometasone, triamcinolone This list may not describe all possible interactions. Give your health care provider a list of all the medicines, herbs, non-prescription drugs, or dietary supplements you use. Also tell them if you smoke, drink alcohol, or use illegal drugs. Some items may interact with your medicine. What should I watch for while using this medication? Your condition will be monitored carefully while you are receiving this medication. Visit your care team for regular checkups. Tell your care team if your symptoms do not start to get better or if they get worse. If you have untreated HIV infection, this medication may lead to some HIV medications not working as well in the future. Birth control may not work properly while you are taking this medication. Talk to your care team about using an extra method of birth control. What side effects may I notice from receiving this medication? Side effects that you should report to your care team as soon as possible: Allergic reactions--skin rash, itching, hives, swelling of the face, lips, tongue, or throat Liver injury--right upper belly pain, loss of appetite, nausea, light-colored stool, dark yellow or brown urine, yellowing skin or eyes, unusual weakness or fatigue Redness, blistering, peeling, or loosening of the skin, including inside the mouth Side effects that usually do not require medical attention (report these to your care team if they continue or are  bothersome): Change in taste Diarrhea General discomfort and fatigue Increase in blood pressure Muscle pain Nausea Stomach pain This list may not describe all possible side effects. Call your doctor for medical advice about side effects. You may report side effects to FDA at 1-800-FDA-1088. Where should I keep my medication? Keep out of the reach of children and pets. Store at room temperature between 20 and 25 degrees C (68 and 77 degrees F). Get rid of any unused medication after the expiration date. To get rid of medications that are no longer needed or have expired: Take the medication to a medication take-back program. Check with your pharmacy or law enforcement to find a location. If you cannot return the medication, check the label or package insert to see if the medication should be thrown out in the garbage or flushed down the toilet. If you are not sure, ask your care team. If it is safe to put it in the trash, take the medication out of the container. Mix the medication with cat litter, dirt, coffee grounds, or other unwanted substance. Seal the mixture in a bag or container. Put it in the trash. NOTE: This sheet is a summary. It may not cover all possible information. If you have questions about this medicine, talk to your doctor, pharmacist, or health care provider.  2022 Elsevier/Gold Standard (2021-02-26 00:00:00)    If you have been instructed to  have an in-person evaluation today at a local Urgent Care facility, please use the link below. It will take you to a list of all of our available [facility], including address, phone number and hours of operation. Please do not delay care.  [facility]  If you or a family member do not have a primary care provider, use the link below to schedule a visit and establish care. When you choose a [facility] primary care physician or advanced practice provider, you gain a long-term partner in health. Find a  Primary Care Provider  Learn more about [facility] in-office and virtual care options: [facility]

## 2021-06-18 ENCOUNTER — Encounter (INDEPENDENT_AMBULATORY_CARE_PROVIDER_SITE_OTHER): Payer: Self-pay | Admitting: Gastroenterology

## 2021-06-18 ENCOUNTER — Ambulatory Visit (INDEPENDENT_AMBULATORY_CARE_PROVIDER_SITE_OTHER): Payer: Medicare Other | Admitting: Gastroenterology

## 2021-06-27 ENCOUNTER — Ambulatory Visit (INDEPENDENT_AMBULATORY_CARE_PROVIDER_SITE_OTHER): Payer: Medicare Other | Admitting: Internal Medicine

## 2021-10-15 ENCOUNTER — Ambulatory Visit (INDEPENDENT_AMBULATORY_CARE_PROVIDER_SITE_OTHER): Payer: Medicare Other

## 2022-07-28 IMAGING — MR MR HEAD W/O CM
13 of 14 series · 37 of 48 positions shown · non-contrast
Comparison: None.

CLINICAL DATA: Dementia, Alzheimer's suspected.

EXAM:
MRI HEAD WITHOUT CONTRAST
TECHNIQUE: Multiplanar, multiecho pulse sequences of the brain and surrounding
structures were obtained without intravenous contrast.

[Series 5: DWI · axial · 3.0mm · 0.77mm/px · z∈[-58,+82]mm · 4 of 48 slices shown (1 of 6)]
[im 1/48]
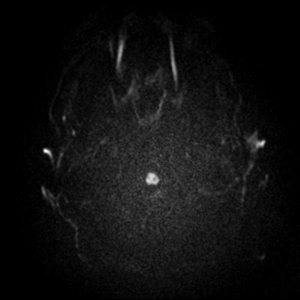
[im 16/48]
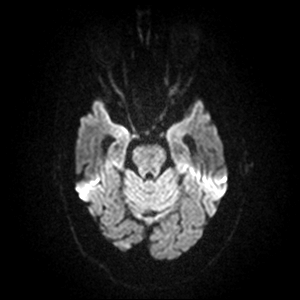
[im 32/48]
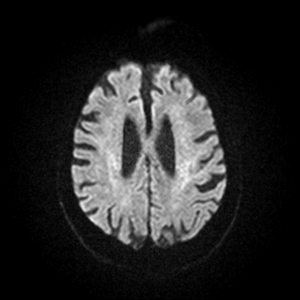
[im 48/48]
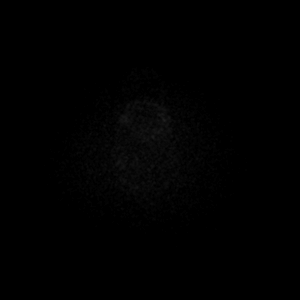

[Series 5: DWI · axial · 3.0mm · 0.77mm/px · z∈[-58,+82]mm · 4 of 48 slices shown (2 of 6)]
[im 1/48]
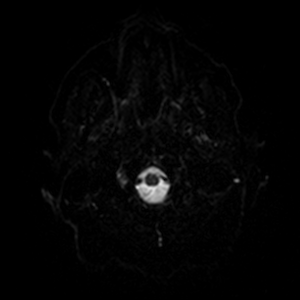
[im 16/48]
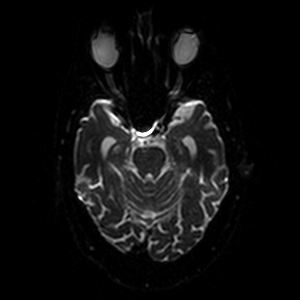
[im 32/48]
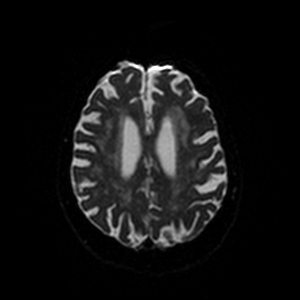
[im 48/48]
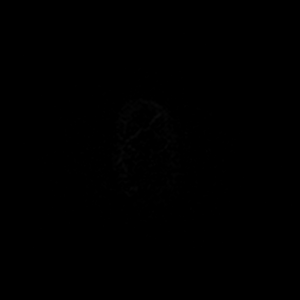

[Series 6: DWI · axial · 3.0mm · 0.77mm/px · z∈[-58,+82]mm · 4 of 48 slices shown (3 of 6)]
[im 1/48]
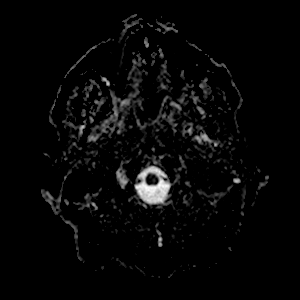
[im 16/48]
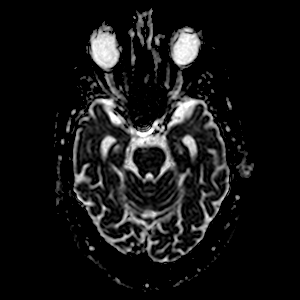
[im 32/48]
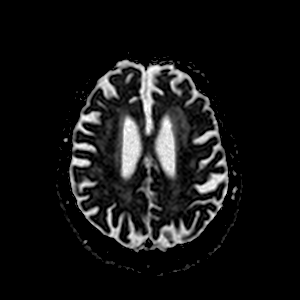
[im 48/48]
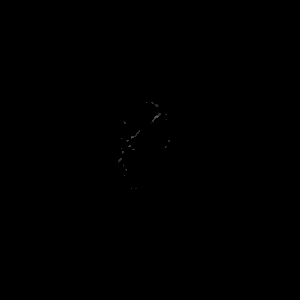

[Series 7: DWI · coronal · 5.0mm · 0.88mm/px · 2 of 30 slices shown (4 of 6)]
[im 1/30]
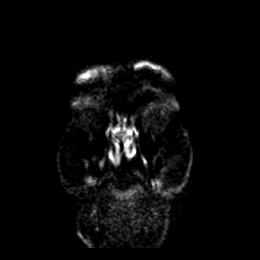
[im 30/30]
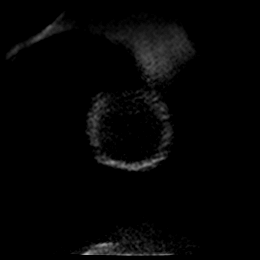

[Series 7: DWI · coronal · 5.0mm · 0.88mm/px · 2 of 30 slices shown (5 of 6)]
[im 1/30]
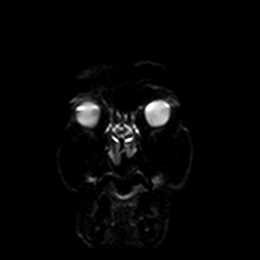
[im 30/30]
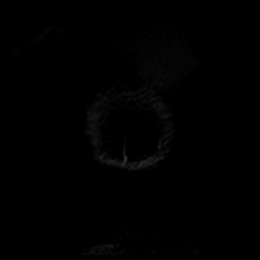

[Series 8: DWI · coronal · 5.0mm · 0.88mm/px · 2 of 30 slices shown (6 of 6)]
[im 1/30]
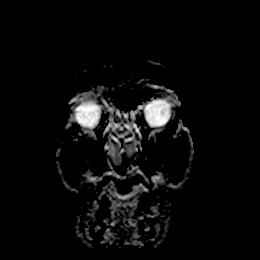
[im 30/30]
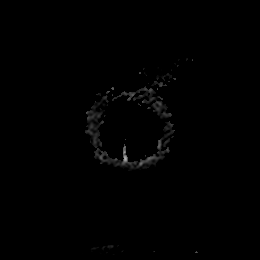

[Series 9: T2 · axial · 5.0mm · 0.72mm/px · 1 of 20 slices shown (1 of 2)]
[im 1/20]
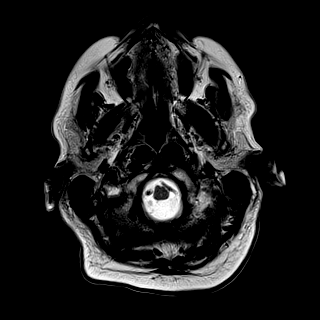

[Series 10: T1 · sagittal · 5.0mm · 0.75mm/px · 1 of 20 slices shown]
[im 1/20]
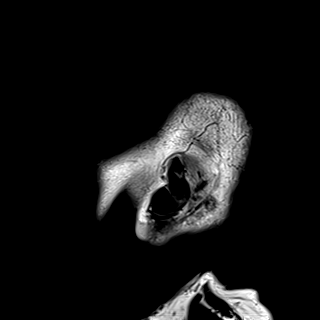

[Series 11: mag_images · axial · 3.0mm · 0.90mm/px · z∈[-66,+86]mm · 4 of 52 slices shown]
[im 1/52]
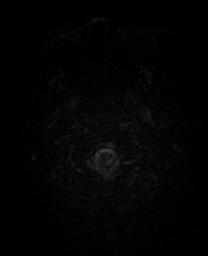
[im 18/52]
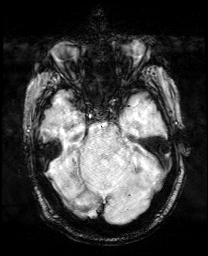
[im 35/52]
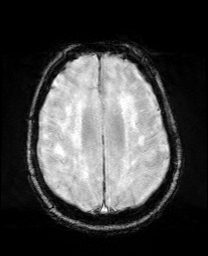
[im 52/52]
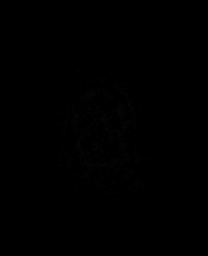

[Series 12: pha_images · axial · 3.0mm · 0.90mm/px · z∈[-66,+83]mm · 4 of 51 slices shown]
[im 1/51]
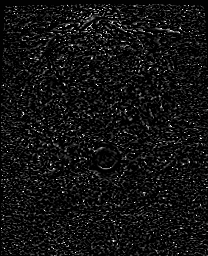
[im 17/51]
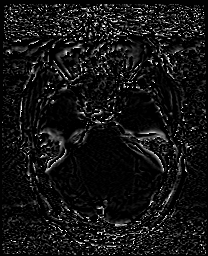
[im 34/51]
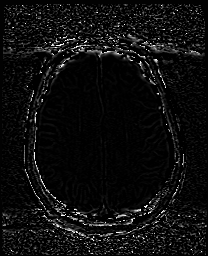
[im 51/51]
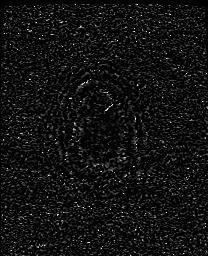

[Series 13: swi_images · axial · 3.0mm · 0.90mm/px · z∈[-66,+86]mm · 4 of 52 slices shown]
[im 1/52]
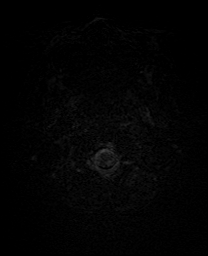
[im 18/52]
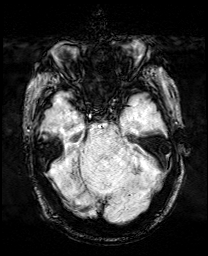
[im 35/52]
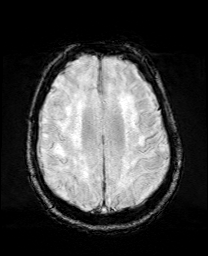
[im 52/52]
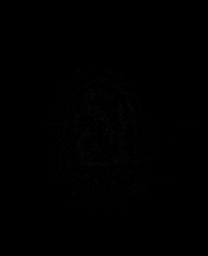

[Series 15: FLAIR · axial · 3.0mm · 0.45mm/px · z∈[-57,+80]mm · 3 of 47 slices shown]
[im 1/47]
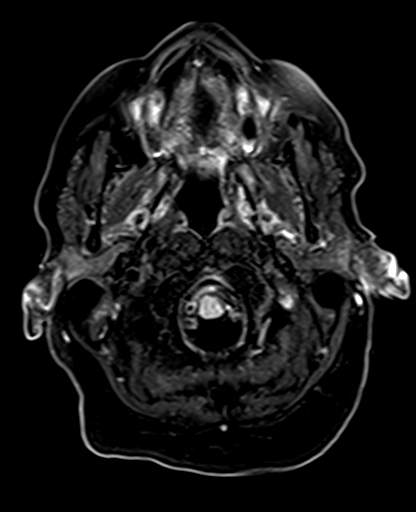
[im 24/47]
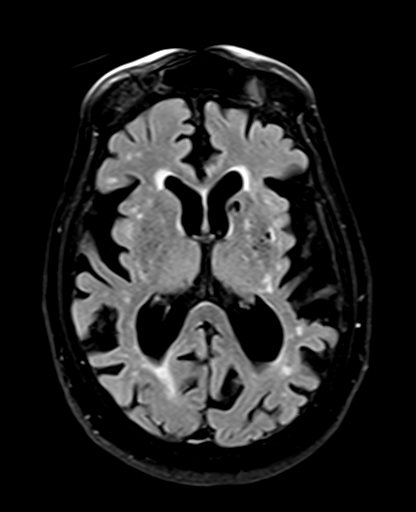
[im 47/47]
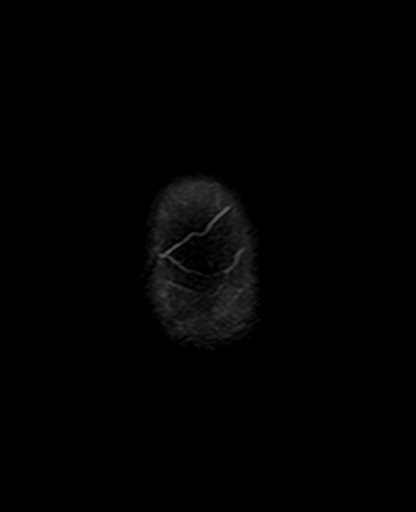

[Series 17: T2 · coronal · 5.0mm · 0.72mm/px · 2 of 29 slices shown (2 of 2)]
[im 1/29]
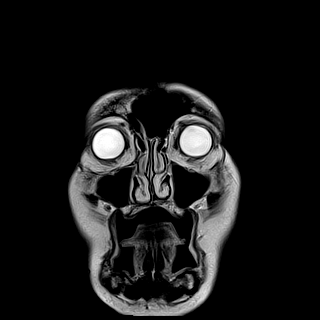
[im 29/29]
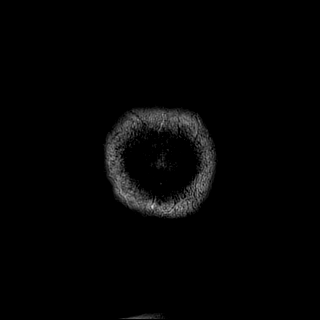

[37 of 48 positions shown; findings below may reference images not displayed]

FINDINGS: Brain: There is no evidence of an acute infarct, mass, midline
shift, or extra-axial fluid collection. A single chronic
microhemorrhage is noted in the right cerebellar hemisphere. Patchy
to confluent T2 hyperintensities in the cerebral white matter and
pons are nonspecific but compatible with moderate to severe chronic
small vessel ischemic disease. A chronic lacunar infarct with
associated chronic blood products is noted in the left caudate head.
There is moderate cerebral atrophy without lobar predominance.

Vascular: Major intracranial vascular flow voids are preserved.

Skull and upper cervical spine: Unremarkable bone marrow signal.
Advanced facet arthrosis in the included upper cervical spine with
multilevel grade 1 anterolisthesis.

Sinuses/Orbits: Bilateral cataract extraction. Paranasal sinuses and
mastoid air cells are clear.

Other: Midline frontal scalp scarring.
IMPRESSION: 1. No acute intracranial abnormality.
2. Moderate to severe chronic small vessel ischemic disease.
3. Moderate cerebral atrophy ([id number]).

## 2022-09-11 ENCOUNTER — Other Ambulatory Visit (HOSPITAL_COMMUNITY): Payer: Self-pay | Admitting: Adult Health

## 2022-09-11 ENCOUNTER — Ambulatory Visit (HOSPITAL_COMMUNITY)
Admission: RE | Admit: 2022-09-11 | Discharge: 2022-09-11 | Disposition: A | Discharge status: Home / Self Care | Payer: Medicare Other | Source: Ambulatory Visit | Attending: Adult Health | Admitting: Adult Health

## 2022-09-11 DIAGNOSIS — R058 Other specified cough: Secondary | ICD-10-CM

## 2022-09-16 ENCOUNTER — Other Ambulatory Visit (HOSPITAL_COMMUNITY): Payer: Self-pay | Admitting: Adult Health

## 2022-09-16 DIAGNOSIS — R911 Solitary pulmonary nodule: Secondary | ICD-10-CM

## 2022-09-28 ENCOUNTER — Ambulatory Visit (INDEPENDENT_AMBULATORY_CARE_PROVIDER_SITE_OTHER): Payer: Medicare Other

## 2022-09-28 ENCOUNTER — Ambulatory Visit
Admission: EM | Admit: 2022-09-28 | Discharge: 2022-09-28 | Disposition: A | Discharge status: Home / Self Care | Payer: Medicare Other | Attending: Family Medicine | Admitting: Family Medicine

## 2022-09-28 DIAGNOSIS — R051 Acute cough: Secondary | ICD-10-CM | POA: Diagnosis not present

## 2022-09-28 DIAGNOSIS — R5383 Other fatigue: Secondary | ICD-10-CM | POA: Insufficient documentation

## 2022-09-28 DIAGNOSIS — R1030 Lower abdominal pain, unspecified: Secondary | ICD-10-CM | POA: Diagnosis present

## 2022-09-28 LAB — POCT URINALYSIS DIP (MANUAL ENTRY)
Bilirubin, UA: NEGATIVE
Glucose, UA: NEGATIVE mg/dL
Ketones, POC UA: NEGATIVE mg/dL
Leukocytes, UA: NEGATIVE
Nitrite, UA: NEGATIVE
Spec Grav, UA: 1.015 (ref 1.010–1.025)
Urobilinogen, UA: 0.2 E.U./dL
pH, UA: 6 (ref 5.0–8.0)

## 2022-09-28 LAB — POCT FASTING CBG KUC MANUAL ENTRY: POCT Glucose (KUC): 154 mg/dL — AB (ref 70–99)

## 2022-09-28 LAB — POCT INFLUENZA A/B
Influenza A, POC: NEGATIVE
Influenza B, POC: NEGATIVE

## 2022-09-28 NOTE — ED Notes (Signed)
Unable to obtain blood sample. CMA and RN both attempted. Pt tolerated well.   Order printed and pt family reported would follow-up with [facility] to obtain blood sample.

## 2022-09-28 NOTE — ED Triage Notes (Signed)
Pt reports he doesn't feel well x 2 weeks. He has a cough, and lower abdominal pain, loss of appetite, trouble walking (not sure if it is related, per family)

## 2022-09-28 NOTE — Discharge Instructions (Signed)
You have had labs (urine culture and blood tests) sent today. We will call you with any significant abnormalities or if there is need to begin or change treatment or pursue further follow up.  You may also review your test results online through MyChart. If you do not have a MyChart account, instructions to sign up should be on your discharge paperwork.  You have been seen today for abdominal pain. Your evaluation was not suggestive of any emergent condition requiring medical intervention at this time. However, some abdominal problems make take more time to appear. Therefore, it is very important for you to pay attention to any new symptoms or worsening of your current condition.  Please return here or to the Emergency Department immediately should you begin to feel worse in any way or have any of the following symptoms: increasing or different abdominal pain, persistent vomiting, inability to drink fluids, fevers, or shaking chills.

## 2022-09-29 LAB — URINE CULTURE: Culture: NO GROWTH

## 2022-09-30 NOTE — ED Provider Notes (Signed)
[facility]   [id number] 09/28/22 Arrival Time: 1120  ASSESSMENT & PLAN:  1. Lower abdominal pain   2. Other fatigue   3. Acute cough    Benign abdominal exam. No indications for urgent abdominal/pelvic imaging at this time. Influenza negative. U/A without signs of infection; trace blood; urine culture sent.  Son who is with him is comfortable with home observation. Attempted to draw blood work but could not obtain. [facility] orders given; he will obtain as outpt.  Orders Placed This Encounter  Procedures   Urine Culture   DG Chest 2 View      POCT urinalysis dipstick   POCT Influenza A/B   POCT CBG (manual entry)   Imaging: I have personally viewed and independently interpreted the imaging studies obatined this visit. No acute lung changes. No signs of PNA.  May take OTC heartburn medications and Tylenol as needed.     Discharge Instructions      You have had labs (urine culture and blood tests) sent today. We will call you with any significant abnormalities or if there is need to begin or change treatment or pursue further follow up.  You may also review your test results online through MyChart. If you do not have a MyChart account, instructions to sign up should be on your discharge paperwork.  You have been seen today for abdominal pain. Your evaluation was not suggestive of any emergent condition requiring medical intervention at this time. However, some abdominal problems make take more time to appear. Therefore, it is very important for you to pay attention to any new symptoms or worsening of your current condition.  Please return here or to the Emergency Department immediately should you begin to feel worse in any way or have any of the following symptoms: increasing or different abdominal pain, persistent vomiting, inability to drink fluids, fevers, or shaking chills.       Follow-up Information     Schedule an appointment as soon as possible for a  visit  with Elenore Paddy, NP.   Specialty: Nurse Practitioner Why: For follow up. Contact information: [address] [place] [address] [phone]               Agrees to ED evaluation should symptoms dramatically worsen. Son voices understanding.  Reviewed expectations re: course of current medical issues. Questions answered. Outlined signs and symptoms indicating need for more acute intervention. Patient verbalized understanding. After Visit Summary given.   SUBJECTIVE: History from:  mostly from patient's son .  Evan Miller is a 89 y.o. male who presents with complaint of not feeling well; has mentioned this to his son over the past couple of weeks. Not walking as much as he usu does. Sleeping normally. Feels PO intake has been normal. On/off over past couple of weeks has complained about lower abd pain and decreased appetite. No abd currently. Occasional cough. Denies back pain. Denies hematuria. Denies any observed weight changes. Son reports no mental status changes.  Past Surgical History:  Procedure Laterality Date   BIOPSY  09/08/2020   Procedure: BIOPSY;  Surgeon: Dolores Frame, MD;  [facility];  Service: Gastroenterology;;   BIOPSY  03/20/2021   Procedure: BIOPSY;  Surgeon: Dolores Frame, MD;  [facility];  Service: Gastroenterology;;   CATARACT EXTRACTION W/PHACO  12/16/2011   Procedure: CATARACT EXTRACTION PHACO AND INTRAOCULAR LENS PLACEMENT (IOC);  Surgeon: Gemma Payor, MD;  Location: [facility];  Service: Ophthalmology;  Laterality: Left;  CDE:17.24    CATARACT EXTRACTION W/PHACO  12/30/2011   Procedure: CATARACT EXTRACTION PHACO AND INTRAOCULAR LENS PLACEMENT (IOC);  Surgeon: Gemma Payor, MD;  Location: [facility];  Service: Ophthalmology;  Laterality: Right;  CDE 15.40   COLONOSCOPY     COLONOSCOPY N/A 08/07/2012   Procedure: COLONOSCOPY;  Surgeon: Malissa Hippo, MD;  [facility];  Service:  Endoscopy;  Laterality: N/A;  10:00   COLONOSCOPY N/A 11/22/2015   Procedure: COLONOSCOPY;  Surgeon: Malissa Hippo, MD;  [facility];  Service: Endoscopy;  Laterality: N/A;  12:00 - moved to 10:30 - Ann to notify   ESOPHAGEAL DILATION N/A 02/10/2015   Procedure: ESOPHAGEAL DILATION;  Surgeon: Malissa Hippo, MD;  [facility];  Service: Endoscopy;  Laterality: N/A;   ESOPHAGEAL DILATION N/A 03/29/2015   Procedure: ESOPHAGEAL DILATION;  Surgeon: Malissa Hippo, MD;  [facility];  Service: Endoscopy;  Laterality: N/A;   ESOPHAGEAL DILATION  11/22/2015   Procedure: ESOPHAGEAL DILATION;  Surgeon: Malissa Hippo, MD;  [facility];  Service: Endoscopy;;   ESOPHAGEAL DILATION N/A 11/19/2017   Procedure: ESOPHAGEAL DILATION;  Surgeon: Malissa Hippo, MD;  [facility];  Service: Endoscopy;  Laterality: N/A;   ESOPHAGEAL DILATION N/A 12/29/2017   Procedure: ESOPHAGEAL DILATION;  Surgeon: Malissa Hippo, MD;  [facility];  Service: Endoscopy;  Laterality: N/A;   ESOPHAGEAL DILATION N/A 09/08/2020   Procedure: ESOPHAGEAL DILATION;  Surgeon: Dolores Frame, MD;  [facility];  Service: Gastroenterology;  Laterality: N/A;   ESOPHAGEAL DILATION N/A 03/20/2021   Procedure: ESOPHAGEAL DILATION;  Surgeon: Dolores Frame, MD;  [facility];  Service: Gastroenterology;  Laterality: N/A;   ESOPHAGOGASTRODUODENOSCOPY N/A 02/10/2015   Procedure: ESOPHAGOGASTRODUODENOSCOPY (EGD);  Surgeon: Malissa Hippo, MD;  [facility];  Service: Endoscopy;  Laterality: N/A;  925 - moved to 10:20 - Ann to notify pt   ESOPHAGOGASTRODUODENOSCOPY N/A 03/29/2015   Procedure: ESOPHAGOGASTRODUODENOSCOPY (EGD);  Surgeon: Malissa Hippo, MD;  [facility];  Service: Endoscopy;  Laterality: N/A;  145 - moved to 8:30 - Ann notified pt   ESOPHAGOGASTRODUODENOSCOPY N/A 11/22/2015   Procedure:  ESOPHAGOGASTRODUODENOSCOPY (EGD);  Surgeon: Malissa Hippo, MD;  [facility];  Service: Endoscopy;  Laterality: N/A;   ESOPHAGOGASTRODUODENOSCOPY N/A 10/27/2017   Procedure: ESOPHAGOGASTRODUODENOSCOPY (EGD);  Surgeon: Corbin Ade, MD;  [facility];  Service: Endoscopy;  Laterality: N/A;  removal of food impaction   ESOPHAGOGASTRODUODENOSCOPY N/A 11/19/2017   Procedure: ESOPHAGOGASTRODUODENOSCOPY (EGD);  Surgeon: Malissa Hippo, MD;  [facility];  Service: Endoscopy;  Laterality: N/A;  7:30   ESOPHAGOGASTRODUODENOSCOPY N/A 12/29/2017   Procedure: ESOPHAGOGASTRODUODENOSCOPY (EGD);  Surgeon: Malissa Hippo, MD;  [facility];  Service: Endoscopy;  Laterality: N/A;  730   ESOPHAGOGASTRODUODENOSCOPY (EGD) WITH PROPOFOL N/A 09/08/2020   Procedure: ESOPHAGOGASTRODUODENOSCOPY (EGD) WITH PROPOFOL;  Surgeon: Dolores Frame, MD;  [facility];  Service: Gastroenterology;  Laterality: N/A;  am   ESOPHAGOGASTRODUODENOSCOPY (EGD) WITH PROPOFOL N/A 02/06/2021   Procedure: ESOPHAGOGASTRODUODENOSCOPY (EGD) WITH PROPOFOL;  Surgeon: Dolores Frame, MD;  [facility];  Service: Gastroenterology;  Laterality: N/A;   ESOPHAGOGASTRODUODENOSCOPY (EGD) WITH PROPOFOL N/A 03/20/2021   Procedure: ESOPHAGOGASTRODUODENOSCOPY (EGD) WITH PROPOFOL;  Surgeon: Dolores Frame, MD;  [facility];  Service: Gastroenterology;  Laterality: N/A;  10:55   HERNIA  REPAIR     HYDROCELE EXCISION     bilateral   JOINT REPLACEMENT     left knee 9/12   MELANOMA EXCISION N/A 06/11/2017   Procedure: WIDE LOCAL EXCISION WITH ADVANCEMENT FLAP CLOSURE LEFT NECK MELANOMA;  Surgeon: Almond Lint, MD;  Location: [facility];  Service: General;  Laterality: N/A;  GENERAL AND LOCAL   TOTAL KNEE ARTHROPLASTY  06/24/2011   Procedure: TOTAL KNEE ARTHROPLASTY;  Surgeon: Loanne Drilling;  Location: [facility];  Service: Orthopedics;   Laterality: Right;     OBJECTIVE:  Vitals:   09/28/22 1133  BP: 97/64  Pulse: 80  Resp: 18  Temp: 97.8 F (36.6 C)  TempSrc: Oral  SpO2: 97%    General appearance: alert, oriented, no acute distress; pleasant and smiling; cracking jokes HEENT: [place]; AT; oropharynx moist Lungs: unlabored respirations Abdomen: soft; without distention; no specific tenderness to palpation; normal bowel sounds; without masses or organomegaly; without guarding or rebound tenderness Back: without reported CVA tenderness; FROM at waist Extremities: without LE edema; symmetrical; without gross deformities Skin: warm and dry Neurologic: normal gait Psychological: alert and cooperative; normal mood and affect  Labs: Results for orders placed or performed during the hospital encounter of 09/28/22  Urine Culture   Specimen: Urine, Clean Catch  Result Value Ref Range   Specimen Description      URINE, CLEAN CATCH Performed at [facility], [address] [place], [place] [address]    Special Requests      NONE Performed at [facility], [address], [place], [place] [address]    Culture      NO GROWTH Performed at [facility], [address] [place], [place] [address]    Report Status 09/29/2022 FINAL   POCT urinalysis dipstick  Result Value Ref Range   Color, UA light yellow (A) yellow   Clarity, UA clear clear   Glucose, UA negative negative mg/dL   Bilirubin, UA negative negative   Ketones, POC UA negative negative mg/dL   Spec Grav, UA 2.130 8.657 - 1.025   Blood, UA trace-intact (A) negative   pH, UA 6.0 5.0 - 8.0   Protein Ur, POC trace (A) negative mg/dL   Urobilinogen, UA 0.2 0.2 or 1.0 E.U./dL   Nitrite, UA Negative Negative   Leukocytes, UA Negative Negative  POCT Influenza A/B  Result Value Ref Range   Influenza A, POC Negative Negative   Influenza B, POC Negative Negative  POCT CBG (manual entry)  Result Value Ref Range   POCT Glucose ([facility]) 154 (A) 70 - 99 mg/dL    Labs Reviewed  POCT URINALYSIS DIP (MANUAL ENTRY) - Abnormal; Notable for the following components:      Result Value   Color, UA light yellow (*)    Blood, UA trace-intact (*)    Protein Ur, POC trace (*)    All other components within normal limits  POCT FASTING CBG [facility] MANUAL ENTRY - Abnormal; Notable for the following components:   POCT Glucose ([facility]) 154 (*)    All other components within normal limits  URINE CULTURE  CBC WITH DIFFERENTIAL/PLATELET  COMPREHENSIVE METABOLIC PANEL  LIPASE, BLOOD  POCT INFLUENZA A/B    Imaging: DG Chest 2 View  Result Date: 09/28/2022 CLINICAL DATA:  89 year old male with cough, fatigue. EXAM: CHEST - 2 VIEW COMPARISON:  Chest radiographs 09/11/2022 and earlier. FINDINGS: Chronic exaggerated thoracic kyphosis, with some progression since 2018. Subsequent increased AP dimension to the lungs but  otherwise shallow appearing lung volumes. Mediastinal contours remain within normal limits. Coarse bilateral pulmonary reticular interstitial opacity does not appear significantly changed, with scattered areas of punctate postinflammatory appearing calcification again noted. No superimposed pneumothorax, pulmonary edema, pleural effusion or confluent lung opacity. No acute osseous abnormality identified. Probable multilevel thoracic ankylosis in addition to exaggerated kyphosis. Negative visible bowel gas. 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 No acute cardiopulmonary abnormality. Electronically Signed   By: Odessa Fleming M.D.   On: 09/28/2022 12:23   Allergies  Allergen Reactions   Ciprofloxacin Hives    Hives at IV infusion site   Penicillins Other (See Comments)    Reaction: large knots on head Has patient had a PCN reaction causing immediate rash, facial/tongue/throat swelling, SOB or lightheadedness with hypotension: No Has patient had a PCN reaction causing severe rash involving mucus  membranes or skin necrosis: No Has patient had a PCN reaction that required hospitalization No Has patient had a PCN reaction occurring within the last 10 years: No If all of the above answers are "NO", then may proceed with Cephalosporin use.    Sulfa Antibiotics Hives                                               Past Medical History:  Diagnosis Date   Arthritis    back,shoulders and hips   Cancer    basal cell/ Melanoma 1997 left  shoulder/  PROSTATE   GERD (gastroesophageal reflux disease)    Hepatitis    age 17 years   Hypothyroidism    Memory loss    PONV (postoperative nausea and vomiting)    Prostate CA     Social History   Socioeconomic History   Marital status: Widowed    Spouse name: Not on file   Number of children: 3   Years of education: 8   Highest education level: 8th grade  Occupational History   Not on file  Tobacco Use   Smoking status: Former    Packs/day: 1.00    Years: 30.00    Additional pack years: 0.00    Total pack years: 30.00    Types: Cigarettes    Quit date: 06/16/1962    Years since quitting: 60.3   Smokeless tobacco: Former    Types: Chew    Quit date: 06/16/2006  Vaping Use   Vaping Use: Never used  Substance and Sexual Activity   Alcohol use: No    Comment: quit  60 yrs ago   Drug use: No   Sexual activity: Yes    Birth control/protection: None  Other Topics Concern   Not on file  Social History Narrative   03/26/21 lives with daughter, Arline Asp   Social Determinants of Health   Financial Resource Strain: Low Risk  (10/09/2020)   Overall Financial Resource Strain (CARDIA)    Difficulty of Paying Living Expenses: Not hard at all  Food Insecurity: No Food Insecurity (10/09/2020)   Hunger Vital Sign    Worried About Running Out of Food in the Last Year: Never true    Ran Out of Food in the Last Year: Never true  Transportation Needs: No Transportation Needs (10/09/2020)   PRAPARE - Administrator, Civil Service  (Medical): No    Lack of Transportation (Non-Medical): No  Physical Activity: Inactive (  10/09/2020)   Exercise Vital Sign    Days of Exercise per Week: 0 days    Minutes of Exercise per Session: 0 min  Stress: No Stress Concern Present (10/09/2020)   [employer] of Occupational Health - Occupational Stress Questionnaire    Feeling of Stress : Not at all  Social Connections: Moderately Isolated (10/09/2020)   Social Connection and Isolation Panel [NHANES]    Frequency of Communication with Friends and Family: More than three times a week    Frequency of Social Gatherings with Friends and Family: More than three times a week    Attends Religious Services: More than 4 times per year    Active Member of [employer] or Organizations: No    Attends Banker Meetings: Never    Marital Status: Widowed  Intimate Partner Violence: Not At Risk (10/09/2020)   Humiliation, Afraid, Rape, and Kick questionnaire    Fear of Current or Ex-Partner: No    Emotionally Abused: No    Physically Abused: No    Sexually Abused: No    Family History  Problem Relation Age of Onset   Breast cancer Mother    Prostate cancer Brother    Nephritis Brother    Rectal cancer Brother    Cirrhosis Brother    Liver cancer Brother    Healthy Daughter    Healthy Son      Mardella Layman, MD 09/30/22 1122

## 2022-10-11 ENCOUNTER — Encounter (HOSPITAL_COMMUNITY): Payer: Self-pay

## 2022-10-11 ENCOUNTER — Other Ambulatory Visit: Payer: Self-pay

## 2022-10-11 ENCOUNTER — Inpatient Hospital Stay (HOSPITAL_COMMUNITY)
Admission: EM | Admit: 2022-10-11 | Discharge: 2022-11-09 | DRG: 871 | Disposition: E | Payer: Medicare Other | Attending: Internal Medicine | Admitting: Internal Medicine

## 2022-10-11 ENCOUNTER — Emergency Department (HOSPITAL_COMMUNITY): Payer: Medicare Other

## 2022-10-11 DIAGNOSIS — E872 Acidosis, unspecified: Secondary | ICD-10-CM | POA: Diagnosis present

## 2022-10-11 DIAGNOSIS — J690 Pneumonitis due to inhalation of food and vomit: Secondary | ICD-10-CM | POA: Diagnosis present

## 2022-10-11 DIAGNOSIS — E162 Hypoglycemia, unspecified: Secondary | ICD-10-CM | POA: Diagnosis not present

## 2022-10-11 DIAGNOSIS — E8809 Other disorders of plasma-protein metabolism, not elsewhere classified: Secondary | ICD-10-CM | POA: Diagnosis present

## 2022-10-11 DIAGNOSIS — E440 Moderate protein-calorie malnutrition: Secondary | ICD-10-CM | POA: Diagnosis present

## 2022-10-11 DIAGNOSIS — Z66 Do not resuscitate: Secondary | ICD-10-CM | POA: Diagnosis present

## 2022-10-11 DIAGNOSIS — Z85820 Personal history of malignant melanoma of skin: Secondary | ICD-10-CM

## 2022-10-11 DIAGNOSIS — E876 Hypokalemia: Secondary | ICD-10-CM | POA: Insufficient documentation

## 2022-10-11 DIAGNOSIS — J9601 Acute respiratory failure with hypoxia: Secondary | ICD-10-CM | POA: Diagnosis not present

## 2022-10-11 DIAGNOSIS — D72829 Elevated white blood cell count, unspecified: Secondary | ICD-10-CM | POA: Insufficient documentation

## 2022-10-11 DIAGNOSIS — A4159 Other Gram-negative sepsis: Principal | ICD-10-CM | POA: Diagnosis present

## 2022-10-11 DIAGNOSIS — N136 Pyonephrosis: Secondary | ICD-10-CM | POA: Diagnosis present

## 2022-10-11 DIAGNOSIS — Z8546 Personal history of malignant neoplasm of prostate: Secondary | ICD-10-CM

## 2022-10-11 DIAGNOSIS — G9341 Metabolic encephalopathy: Secondary | ICD-10-CM | POA: Diagnosis not present

## 2022-10-11 DIAGNOSIS — Z7189 Other specified counseling: Secondary | ICD-10-CM

## 2022-10-11 DIAGNOSIS — Z882 Allergy status to sulfonamides status: Secondary | ICD-10-CM

## 2022-10-11 DIAGNOSIS — Z881 Allergy status to other antibiotic agents status: Secondary | ICD-10-CM

## 2022-10-11 DIAGNOSIS — K56609 Unspecified intestinal obstruction, unspecified as to partial versus complete obstruction: Principal | ICD-10-CM | POA: Diagnosis present

## 2022-10-11 DIAGNOSIS — K7200 Acute and subacute hepatic failure without coma: Secondary | ICD-10-CM | POA: Diagnosis not present

## 2022-10-11 DIAGNOSIS — K567 Ileus, unspecified: Secondary | ICD-10-CM | POA: Diagnosis not present

## 2022-10-11 DIAGNOSIS — R739 Hyperglycemia, unspecified: Secondary | ICD-10-CM | POA: Diagnosis not present

## 2022-10-11 DIAGNOSIS — D75839 Thrombocytosis, unspecified: Secondary | ICD-10-CM | POA: Insufficient documentation

## 2022-10-11 DIAGNOSIS — E871 Hypo-osmolality and hyponatremia: Secondary | ICD-10-CM | POA: Diagnosis present

## 2022-10-11 DIAGNOSIS — Z85828 Personal history of other malignant neoplasm of skin: Secondary | ICD-10-CM

## 2022-10-11 DIAGNOSIS — K219 Gastro-esophageal reflux disease without esophagitis: Secondary | ICD-10-CM | POA: Diagnosis present

## 2022-10-11 DIAGNOSIS — E46 Unspecified protein-calorie malnutrition: Secondary | ICD-10-CM | POA: Insufficient documentation

## 2022-10-11 DIAGNOSIS — A419 Sepsis, unspecified organism: Secondary | ICD-10-CM | POA: Diagnosis not present

## 2022-10-11 DIAGNOSIS — E039 Hypothyroidism, unspecified: Secondary | ICD-10-CM | POA: Diagnosis present

## 2022-10-11 DIAGNOSIS — R188 Other ascites: Secondary | ICD-10-CM | POA: Insufficient documentation

## 2022-10-11 DIAGNOSIS — Z8042 Family history of malignant neoplasm of prostate: Secondary | ICD-10-CM

## 2022-10-11 DIAGNOSIS — J9600 Acute respiratory failure, unspecified whether with hypoxia or hypercapnia: Secondary | ICD-10-CM | POA: Diagnosis not present

## 2022-10-11 DIAGNOSIS — N170 Acute kidney failure with tubular necrosis: Secondary | ICD-10-CM | POA: Diagnosis present

## 2022-10-11 DIAGNOSIS — R6521 Severe sepsis with septic shock: Secondary | ICD-10-CM | POA: Diagnosis present

## 2022-10-11 DIAGNOSIS — Z515 Encounter for palliative care: Secondary | ICD-10-CM

## 2022-10-11 DIAGNOSIS — D75838 Other thrombocytosis: Secondary | ICD-10-CM | POA: Diagnosis present

## 2022-10-11 DIAGNOSIS — Z87891 Personal history of nicotine dependence: Secondary | ICD-10-CM

## 2022-10-11 DIAGNOSIS — Z880 Allergy status to penicillin: Secondary | ICD-10-CM

## 2022-10-11 DIAGNOSIS — Z96651 Presence of right artificial knee joint: Secondary | ICD-10-CM | POA: Diagnosis present

## 2022-10-11 DIAGNOSIS — Z800 Family history of malignant neoplasm of digestive organs: Secondary | ICD-10-CM

## 2022-10-11 DIAGNOSIS — Z6828 Body mass index (BMI) 28.0-28.9, adult: Secondary | ICD-10-CM

## 2022-10-11 DIAGNOSIS — D6489 Other specified anemias: Secondary | ICD-10-CM | POA: Diagnosis present

## 2022-10-11 DIAGNOSIS — G9340 Encephalopathy, unspecified: Secondary | ICD-10-CM

## 2022-10-11 LAB — COMPREHENSIVE METABOLIC PANEL
ALT: 29 U/L (ref 0–44)
AST: 39 U/L (ref 15–41)
Albumin: 2.6 g/dL — ABNORMAL LOW (ref 3.5–5.0)
Alkaline Phosphatase: 219 U/L — ABNORMAL HIGH (ref 38–126)
Anion gap: 11 (ref 5–15)
BUN: 23 mg/dL (ref 8–23)
CO2: 25 mmol/L (ref 22–32)
Calcium: 8.2 mg/dL — ABNORMAL LOW (ref 8.9–10.3)
Chloride: 96 mmol/L — ABNORMAL LOW (ref 98–111)
Creatinine, Ser: 1.46 mg/dL — ABNORMAL HIGH (ref 0.61–1.24)
GFR, Estimated: 44 mL/min — ABNORMAL LOW (ref >60)
Glucose, Bld: 169 mg/dL — ABNORMAL HIGH (ref 70–99)
Potassium: 3.4 mmol/L — ABNORMAL LOW (ref 3.5–5.1)
Sodium: 132 mmol/L — ABNORMAL LOW (ref 135–145)
Total Bilirubin: 1.8 mg/dL — ABNORMAL HIGH (ref 0.3–1.2)
Total Protein: 6.3 g/dL — ABNORMAL LOW (ref 6.5–8.1)

## 2022-10-11 LAB — CBC WITH DIFFERENTIAL/PLATELET
Abs Immature Granulocytes: 0.13 10*3/uL — ABNORMAL HIGH (ref 0.00–0.07)
Basophils Absolute: 0 10*3/uL (ref 0.0–0.1)
Basophils Relative: 0 %
Eosinophils Absolute: 0 10*3/uL (ref 0.0–0.5)
Eosinophils Relative: 0 %
HCT: 29.8 % — ABNORMAL LOW (ref 39.0–52.0)
Hemoglobin: 9.7 g/dL — ABNORMAL LOW (ref 13.0–17.0)
Immature Granulocytes: 1 %
Lymphocytes Relative: 3 %
Lymphs Abs: 0.5 10*3/uL — ABNORMAL LOW (ref 0.7–4.0)
MCH: 27 pg (ref 26.0–34.0)
MCHC: 32.6 g/dL (ref 30.0–36.0)
MCV: 83 fL (ref 80.0–100.0)
Monocytes Absolute: 1.2 10*3/uL — ABNORMAL HIGH (ref 0.1–1.0)
Monocytes Relative: 6 %
Neutro Abs: 16.8 10*3/uL — ABNORMAL HIGH (ref 1.7–7.7)
Neutrophils Relative %: 90 %
Platelets: 474 10*3/uL — ABNORMAL HIGH (ref 150–400)
RBC: 3.59 MIL/uL — ABNORMAL LOW (ref 4.22–5.81)
RDW: 15.1 % (ref 11.5–15.5)
WBC: 18.7 10*3/uL — ABNORMAL HIGH (ref 4.0–10.5)
nRBC: 0 % (ref 0.0–0.2)

## 2022-10-11 MED ORDER — SODIUM CHLORIDE 0.9 % IV BOLUS
1000.0000 mL | Freq: Once | INTRAVENOUS | Status: AC
  Administered 2022-10-11: 1000 mL via INTRAVENOUS

## 2022-10-11 MED ORDER — PROCHLORPERAZINE EDISYLATE 10 MG/2ML IJ SOLN
10.0000 mg | Freq: Four times a day (QID) | INTRAMUSCULAR | Status: DC | PRN
  Administered 2022-10-12: 10 mg via INTRAVENOUS
  Filled 2022-10-11: qty 2

## 2022-10-11 MED ORDER — LACTATED RINGERS IV SOLN: INTRAVENOUS | Status: DC

## 2022-10-11 MED ORDER — IOHEXOL 300 MG/ML  SOLN
80.0000 mL | Freq: Once | INTRAMUSCULAR | Status: AC | PRN
  Administered 2022-10-11: 80 mL via INTRAVENOUS

## 2022-10-11 MED ORDER — HEPARIN SODIUM (PORCINE) 5000 UNIT/ML IJ SOLN
5000.0000 [IU] | Freq: Three times a day (TID) | INTRAMUSCULAR | Status: DC
  Administered 2022-10-12 – 2022-10-13 (×5): 5000 [IU] via SUBCUTANEOUS
  Filled 2022-10-11 (×5): qty 1

## 2022-10-11 NOTE — H&P (Signed)
History and Physical    Patient: Evan Miller AOZ:[id number] DOB: [birth year] DOA: Oct 12, 2022 DOS: the patient was seen and examined on 10/12/2022 PCP: Elenore Paddy, NP  Patient coming from: SNF  Chief Complaint:  Chief Complaint  Patient presents with   GI Problem   HPI: Evan Miller is a 89 y.o. male with medical history significant of hypothyroidism, GERD, arthritis and history of Schatzki's ring who presented to the ED via EMS from SNF due to x-ray done at the facility that was concerning for ileus.  Patient was unable to provide history, history was obtained from ED PA and ED medical record.  Per report, patient has been feeling unwell for about 2 weeks and appetite has decreased, he was unaware of when he had last bowel movement.  He has been tolerating liquids without vomiting, though he has increased belching and presented with distended abdomen.  Patient denies fever, diarrhea, abdominal pain, chest pain or shortness of breath.  ED Course:  In the emergency department, BP was 99/55, other vital signs were within normal range.  Workup in the ED showed WBC 18.7, hemoglobin 9.7, hematocrit 29.8, MCV 83.0, platelets 474.  BMP showed sodium 132, potassium 3.4, chloride 96, bicarb 25, blood glucose 169, BUN 23, creatinine 1.46, albumin 2.62, ALP 219. CT abdomen and pelvis with contrast showed 1. Multiple dilated loops of small bowel in the central abdomen worrisome for small bowel obstruction. No definite transition point visualized. 2. Large volume ascites. There is peritoneal wall enhancement in the pelvis and upper abdomen, indeterminate. 3. Mild bilateral hydroureteronephrosis without obstructing calculus identified. 4. Trace left pleural effusion. 5. Sclerotic osseous lesions worrisome for metastatic disease. IV hydration of 1 L NS was given. General surgery (Dr. Santina Evans Pappayliou) was consulted and recommended admitting patient to medicine with plan to consult on patient in  the morning. Hospitalist was asked to admit patient for further evaluation and management.  Review of Systems: Review of systems as noted in the HPI. All other systems reviewed and are negative.   Past Medical History:  Diagnosis Date   Arthritis    back,shoulders and hips   Cancer (HCC)    basal cell/ Melanoma 1997 left  shoulder/  PROSTATE   GERD (gastroesophageal reflux disease)    Hepatitis    age 68 years   Hypothyroidism    Memory loss    PONV (postoperative nausea and vomiting)    Prostate CA [facility])    Past Surgical History:  Procedure Laterality Date   BIOPSY  09/08/2020   Procedure: BIOPSY;  Surgeon: Dolores Frame, MD;  [facility];  Service: Gastroenterology;;   BIOPSY  03/20/2021   Procedure: BIOPSY;  Surgeon: Dolores Frame, MD;  [facility];  Service: Gastroenterology;;   CATARACT EXTRACTION W/PHACO  12/16/2011   Procedure: CATARACT EXTRACTION PHACO AND INTRAOCULAR LENS PLACEMENT (IOC);  Surgeon: Gemma Payor, MD;  [facility]: [facility];  Service: Ophthalmology;  Laterality: Left;  CDE:17.24    CATARACT EXTRACTION W/PHACO  12/30/2011   Procedure: CATARACT EXTRACTION PHACO AND INTRAOCULAR LENS PLACEMENT (IOC);  Surgeon: Gemma Payor, MD;  Location: [facility];  Service: Ophthalmology;  Laterality: Right;  CDE 15.40   COLONOSCOPY     COLONOSCOPY N/A 08/07/2012   Procedure: COLONOSCOPY;  Surgeon: Malissa Hippo, MD;  [facility];  Service: Endoscopy;  Laterality: N/A;  10:00   COLONOSCOPY N/A 11/22/2015   Procedure: COLONOSCOPY;  Surgeon: Malissa Hippo, MD;  [facility];  Service: Endoscopy;  Laterality: N/A;  12:00 - moved to 10:30 - Ann to notify   ESOPHAGEAL DILATION N/A 02/10/2015   Procedure: ESOPHAGEAL DILATION;  Surgeon: Malissa Hippo, MD;  [facility];  Service: Endoscopy;  Laterality: N/A;   ESOPHAGEAL DILATION N/A 03/29/2015   Procedure: ESOPHAGEAL DILATION;  Surgeon: Malissa Hippo, MD;   [facility];  Service: Endoscopy;  Laterality: N/A;   ESOPHAGEAL DILATION  11/22/2015   Procedure: ESOPHAGEAL DILATION;  Surgeon: Malissa Hippo, MD;  [facility];  Service: Endoscopy;;   ESOPHAGEAL DILATION N/A 11/19/2017   Procedure: ESOPHAGEAL DILATION;  Surgeon: Malissa Hippo, MD;  [facility];  Service: Endoscopy;  Laterality: N/A;   ESOPHAGEAL DILATION N/A 12/29/2017   Procedure: ESOPHAGEAL DILATION;  Surgeon: Malissa Hippo, MD;  [facility];  Service: Endoscopy;  Laterality: N/A;   ESOPHAGEAL DILATION N/A 09/08/2020   Procedure: ESOPHAGEAL DILATION;  Surgeon: Dolores Frame, MD;  [facility];  Service: Gastroenterology;  Laterality: N/A;   ESOPHAGEAL DILATION N/A 03/20/2021   Procedure: ESOPHAGEAL DILATION;  Surgeon: Dolores Frame, MD;  [facility];  Service: Gastroenterology;  Laterality: N/A;   ESOPHAGOGASTRODUODENOSCOPY N/A 02/10/2015   Procedure: ESOPHAGOGASTRODUODENOSCOPY (EGD);  Surgeon: Malissa Hippo, MD;  [facility];  Service: Endoscopy;  Laterality: N/A;  925 - moved to 10:20 - Ann to notify pt   ESOPHAGOGASTRODUODENOSCOPY N/A 03/29/2015   Procedure: ESOPHAGOGASTRODUODENOSCOPY (EGD);  Surgeon: Malissa Hippo, MD;  [facility];  Service: Endoscopy;  Laterality: N/A;  145 - moved to 8:30 - Ann notified pt   ESOPHAGOGASTRODUODENOSCOPY N/A 11/22/2015   Procedure: ESOPHAGOGASTRODUODENOSCOPY (EGD);  Surgeon: Malissa Hippo, MD;  [facility];  Service: Endoscopy;  Laterality: N/A;   ESOPHAGOGASTRODUODENOSCOPY N/A 10/27/2017   Procedure: ESOPHAGOGASTRODUODENOSCOPY (EGD);  Surgeon: Corbin Ade, MD;  [facility];  Service: Endoscopy;  Laterality: N/A;  removal of food impaction   ESOPHAGOGASTRODUODENOSCOPY N/A 11/19/2017   Procedure: ESOPHAGOGASTRODUODENOSCOPY (EGD);  Surgeon: Malissa Hippo, MD;  [facility];  Service: Endoscopy;   Laterality: N/A;  7:30   ESOPHAGOGASTRODUODENOSCOPY N/A 12/29/2017   Procedure: ESOPHAGOGASTRODUODENOSCOPY (EGD);  Surgeon: Malissa Hippo, MD;  [facility];  Service: Endoscopy;  Laterality: N/A;  730   ESOPHAGOGASTRODUODENOSCOPY (EGD) WITH PROPOFOL N/A 09/08/2020   Procedure: ESOPHAGOGASTRODUODENOSCOPY (EGD) WITH PROPOFOL;  Surgeon: Dolores Frame, MD;  [facility];  Service: Gastroenterology;  Laterality: N/A;  am   ESOPHAGOGASTRODUODENOSCOPY (EGD) WITH PROPOFOL N/A 02/06/2021   Procedure: ESOPHAGOGASTRODUODENOSCOPY (EGD) WITH PROPOFOL;  Surgeon: Dolores Frame, MD;  [facility];  Service: Gastroenterology;  Laterality: N/A;   ESOPHAGOGASTRODUODENOSCOPY (EGD) WITH PROPOFOL N/A 03/20/2021   Procedure: ESOPHAGOGASTRODUODENOSCOPY (EGD) WITH PROPOFOL;  Surgeon: Dolores Frame, MD;  [facility];  Service: Gastroenterology;  Laterality: N/A;  10:55   HERNIA REPAIR     HYDROCELE EXCISION     bilateral   JOINT REPLACEMENT     left knee 9/12   MELANOMA EXCISION N/A 06/11/2017   Procedure: WIDE LOCAL EXCISION WITH ADVANCEMENT FLAP CLOSURE LEFT NECK MELANOMA;  Surgeon: Almond Lint, MD;  Location: [facility];  Service: General;  Laterality: N/A;  GENERAL AND LOCAL   TOTAL KNEE ARTHROPLASTY  06/24/2011   Procedure: TOTAL KNEE ARTHROPLASTY;  Surgeon: Loanne Drilling;  Location: [facility];  Service: Orthopedics;  Laterality: Right;  Social History:  reports that he quit smoking about 60 years ago. His smoking use included cigarettes. He has a 30.00 pack-year smoking history. He quit smokeless tobacco use about 16 years ago.  His smokeless tobacco use included chew. He reports that he does not drink alcohol and does not use drugs.   Allergies  Allergen Reactions   Ciprofloxacin Hives    Hives at IV infusion site   Penicillins Other (See Comments)    Reaction: large knots on head Has patient had a PCN reaction  causing immediate rash, facial/tongue/throat swelling, SOB or lightheadedness with hypotension: No Has patient had a PCN reaction causing severe rash involving mucus membranes or skin necrosis: No Has patient had a PCN reaction that required hospitalization No Has patient had a PCN reaction occurring within the last 10 years: No If all of the above answers are "NO", then may proceed with Cephalosporin use.    Sulfa Antibiotics Hives    Family History  Problem Relation Age of Onset   Breast cancer Mother    Prostate cancer Brother    Nephritis Brother    Rectal cancer Brother    Cirrhosis Brother    Liver cancer Brother    Healthy Daughter    Healthy Son      Prior to Admission medications   Medication Sig Start Date End Date Taking? Authorizing Provider  amoxicillin-clavulanate (AUGMENTIN) 875-125 MG tablet Take 1 tablet every 12 hours by oral route for 5 days.    [provider]  benzonatate (TESSALON) 100 MG capsule Take 1 capsule (100 mg total) by mouth 3 (three) times daily as needed. 06/01/21   Margaretann Loveless, PA-C  NP THYROID 120 MG tablet TAKE 1 TABLET(120 MG) BY MOUTH DAILY Patient taking differently: Take 120 mg by mouth daily at 6 (six) AM. 11/02/20   Wilson Singer, MD  omeprazole (PRILOSEC) 40 MG capsule Take 1 capsule (40 mg total) by mouth 2 (two) times daily. 02/07/21   Rodolph Bong, MD  triamcinolone (KENALOG) 0.1 % Apply 1 application topically 2 (two) times daily. Patient not taking: No sig reported 05/02/20   Wilson Singer, MD    Physical Exam: BP (!) 83/56 (BP Location: Right Arm)   Pulse 100   Temp 97.7 F (36.5 C) (Oral)   Resp 18   Ht 5\' 3"  (1.6 m)   Wt 72.9 kg   SpO2 96%   BMI 28.47 kg/m   General: 89 y.o. year-old male well developed well nourished in no acute distress.  Alert and oriented x3. HEENT: NCAT, EOMI Neck: Supple, trachea medial Cardiovascular: Regular rate and rhythm with no rubs or gallops.  No thyromegaly  or JVD noted.  No lower extremity edema. 2/4 pulses in all 4 extremities. Respiratory: Clear to auscultation with no wheezes or rales. Good inspiratory effort. Abdomen: Soft, distended with decreased bowel sounds x4 quadrants. Muskuloskeletal: No cyanosis, clubbing or edema noted bilaterally Neuro: CN II-XII intact, strength 5/5 x 4, sensation, reflexes intact Skin: No ulcerative lesions noted or rashes Psychiatry: Judgement and insight appear normal. Mood is appropriate for condition and setting          Labs on Admission:  Basic Metabolic Panel: Recent Labs  Lab 26-Oct-2022 1849  NA 132*  K 3.4*  CL 96*  CO2 25  GLUCOSE 169*  BUN 23  CREATININE 1.46*  CALCIUM 8.2*   Liver Function Tests: Recent Labs  Lab Oct 26, 2022 1849  AST 39  ALT 29  ALKPHOS  219*  BILITOT 1.8*  PROT 6.3*  ALBUMIN 2.6*   No results for input(s): "LIPASE", "AMYLASE" in the last 168 hours. No results for input(s): "AMMONIA" in the last 168 hours. CBC: Recent Labs  Lab 10-18-22 1849  WBC 18.7*  NEUTROABS 16.8*  HGB 9.7*  HCT 29.8*  MCV 83.0  PLT 474*   Cardiac Enzymes: No results for input(s): "CKTOTAL", "CKMB", "CKMBINDEX", "TROPONINI" in the last 168 hours.  BNP (last 3 results) No results for input(s): "BNP" in the last 8760 hours.  ProBNP (last 3 results) No results for input(s): "PROBNP" in the last 8760 hours.  CBG: No results for input(s): "GLUCAP" in the last 168 hours.  Radiological Exams on Admission: CT ABDOMEN PELVIS W CONTRAST  Result Date: 2022-10-18 CLINICAL DATA:  Bowel obstruction suspected.  Prostate cancer. EXAM: CT ABDOMEN AND PELVIS WITH CONTRAST TECHNIQUE: Multidetector CT imaging of the abdomen and pelvis was performed using the standard protocol following bolus administration of intravenous contrast. RADIATION DOSE REDUCTION: This exam was performed according to the departmental dose-optimization program which includes automated exposure control, adjustment of the mA  and/or kV according to patient size and/or use of iterative reconstruction technique. CONTRAST:  80mL OMNIPAQUE IOHEXOL 300 MG/ML  SOLN COMPARISON:  None Available. FINDINGS: Lower chest: There is a trace left pleural effusion. Hepatobiliary: No focal liver abnormality is seen. No gallstones, gallbladder wall thickening, or biliary dilatation. Pancreas: Unremarkable. No pancreatic ductal dilatation or surrounding inflammatory changes. Spleen: Normal in size without focal abnormality. Calcified granulomas are present. Adrenals/Urinary Tract: There is mild bilateral hydroureteronephrosis without obstructing calculus identified. Bladder and adrenal glands are within normal limits. Stomach/Bowel: There are multiple dilated loops of small bowel in the central abdomen measuring up to 3.7 cm in diameter. No definite transition point visualized. The colon is nondilated. There is a large amount of stool in the rectum. The appendix is normal in size. Stomach is decompressed. A small hiatal hernia is present. Vascular/Lymphatic: Aorta and IVC are normal in size. No enlarged lymph nodes are identified. Reproductive: Prostate gland appears within normal limits. Other: There is large volume ascites throughout the abdomen and pelvis. There is some peritoneal wall enhancement in the pelvis and upper abdomen, indeterminate. There is no focal hernia. Musculoskeletal: Severe degenerative changes affect the spine. There are scattered rounded sclerotic lesions throughout the osseous structures worrisome for metastatic disease. IMPRESSION: 1. Multiple dilated loops of small bowel in the central abdomen worrisome for small bowel obstruction. No definite transition point visualized. 2. Large volume ascites. There is peritoneal wall enhancement in the pelvis and upper abdomen, indeterminate. 3. Mild bilateral hydroureteronephrosis without obstructing calculus identified. 4. Trace left pleural effusion. 5. Sclerotic osseous lesions worrisome  for metastatic disease. Electronically Signed   By: Darliss Cheney M.D.   On: 18-Oct-2022 21:03    EKG: I independently viewed the EKG done and my findings are as followed: Normal sinus rhythm at rate 79 bpm with QTc of 496 ms  Assessment/Plan Present on Admission:  Small bowel obstruction (HCC)  Acquired hypothyroidism  Principal Problem:   Small bowel obstruction (HCC) Active Problems:   Acquired hypothyroidism   Leukocytosis   Thrombocytosis   Hyponatremia   Hypokalemia   Hypoalbuminemia due to protein-calorie malnutrition (HCC)   Hyperglycemia   Ascites   Small bowel obstruction NG tube will be held at this time due to no nausea/vomiting and as per surgical recommendation Continue NPO at this time with plan to advance diet as tolerated Continue IV hydration Continue  Compazine p.r.n. for nausea/vomiting General surgery was consulted by ED PA and we will consult on patient in the morning  Ascites CT abdomen and pelvis was suggestive of large volume ascites It is uncertain if patient will require paracentesis at this time, however, we shall await general surgery consult for further recommendation  Leukocytosis/Thrombocytosis possibly due to hemoconcentration WBC 18.7, platelets 474.  Continue to monitor CBC with morning labs  Hyponatremia Sodium 132, continue IV hydration  Hypokalemia Potassium 3.4, this will be replenished  Hyperglycemia possibly reactive CBG 169, continue to monitor blood glucose level  Hypoalbuminemia possibly secondary to moderate protein calorie malnutrition Albumin 2.5, Consider protein supplement when patient resumes oral intake  Acquired hypothyroidism Continue home medication when patient resumes oral intake  DVT prophylaxis: Heparin subcu  Advance Care Planning:   Code Status: Full Code   Consults: General surgery  Family Communication: None at bedside  Severity of Illness: The appropriate patient status for this patient is  INPATIENT. Inpatient status is judged to be reasonable and necessary in order to provide the required intensity of service to ensure the patient's safety. The patient's presenting symptoms, physical exam findings, and initial radiographic and laboratory data in the context of their chronic comorbidities is felt to place them at high risk for further clinical deterioration. Furthermore, it is not anticipated that the patient will be medically stable for discharge from the hospital within 2 midnights of admission.   * I certify that at the point of admission it is my clinical judgment that the patient will require inpatient hospital care spanning beyond 2 midnights from the point of admission due to high intensity of service, high risk for further deterioration and high frequency of surveillance required.*  Author: Frankey Shown, DO 10/12/2022 3:14 AM  For on call review [email or web address].

## 2022-10-11 NOTE — ED Triage Notes (Signed)
Patient was sent in by EMS after xray showed generalized Ileus.

## 2022-10-11 NOTE — ED Provider Notes (Signed)
[place] EMERGENCY DEPARTMENT AT [facility] Provider Note   CSN: [id number] Arrival date & time: 10/31/2022  1819     History  Chief Complaint  Patient presents with   GI Problem    Evan Miller is a 89 y.o. male with past medical history significant for hypothyroidism, prostate cancer, memory loss, GERD presents to the ED via EMS due to x-ray done at facility that was concerning for ileus.  Patient has had decreased appetite and just general feeling of being unwell over the past 2 weeks.  He cannot recall when his last bowel movement was or when the last time he passed gas was.  Patient has been tolerating liquids without emesis, but has had increase in belching.  His abdomen has become very bloated in appearance.  Denies abdominal pain, chest pain, shortness of breath, fever, diarrhea, syncope, weakness.         Home Medications Prior to Admission medications   Medication Sig Start Date End Date Taking? Authorizing Provider  amoxicillin-clavulanate (AUGMENTIN) 875-125 MG tablet Take 1 tablet every 12 hours by oral route for 5 days.    [provider]  benzonatate (TESSALON) 100 MG capsule Take 1 capsule (100 mg total) by mouth 3 (three) times daily as needed. 06/01/21   Margaretann Loveless, PA-C  NP THYROID 120 MG tablet TAKE 1 TABLET(120 MG) BY MOUTH DAILY Patient taking differently: Take 120 mg by mouth daily at 6 (six) AM. 11/02/20   Wilson Singer, MD  omeprazole (PRILOSEC) 40 MG capsule Take 1 capsule (40 mg total) by mouth 2 (two) times daily. 02/07/21   Rodolph Bong, MD  triamcinolone (KENALOG) 0.1 % Apply 1 application topically 2 (two) times daily. Patient not taking: No sig reported 05/02/20   Wilson Singer, MD      Allergies    Ciprofloxacin, Penicillins, and Sulfa antibiotics    Review of Systems   Review of Systems  Constitutional:  Positive for appetite change (decreased). Negative for fever.  Respiratory:  Negative for  shortness of breath.   Cardiovascular:  Negative for chest pain.  Gastrointestinal:  Positive for abdominal distention. Negative for abdominal pain, diarrhea, nausea and vomiting.  Skin:  Negative for color change.  Neurological:  Negative for syncope and weakness.    Physical Exam Updated Vital Signs BP (!) 114/54   Pulse 88   Temp 98.4 F (36.9 C) (Oral)   Resp 20   Ht 5\' 3"  (1.6 m)   Wt 78.5 kg   SpO2 96%   BMI 30.66 kg/m  Physical Exam Vitals and nursing note reviewed.  Constitutional:      General: He is not in acute distress.    Appearance: He is not ill-appearing.  HENT:     Mouth/Throat:     Mouth: Mucous membranes are moist.     Pharynx: Oropharynx is clear.  Cardiovascular:     Rate and Rhythm: Normal rate and regular rhythm.     Pulses: Normal pulses.     Heart sounds: Normal heart sounds.  Pulmonary:     Effort: Pulmonary effort is normal. No respiratory distress.     Breath sounds: Normal breath sounds and air entry.  Abdominal:     General: Abdomen is protuberant. Bowel sounds are decreased. There is distension.     Palpations: Abdomen is soft.     Tenderness: There is no abdominal tenderness.     Comments: Distended tympanic membrane without appreciable hernias or skin discoloration.  Skin:    General: Skin is warm and dry.     Capillary Refill: Capillary refill takes less than 2 seconds.     Coloration: Skin is not jaundiced.  Neurological:     Mental Status: He is alert. Mental status is at baseline.  Psychiatric:        Mood and Affect: Mood normal.        Behavior: Behavior normal.     ED Results / Procedures / Treatments   Labs (all labs ordered are listed, but only abnormal results are displayed) Labs Reviewed  COMPREHENSIVE METABOLIC PANEL - Abnormal; Notable for the following components:      Result Value   Sodium 132 (*)    Potassium 3.4 (*)    Chloride 96 (*)    Glucose, Bld 169 (*)    Creatinine, Ser 1.46 (*)    Calcium 8.2 (*)     Total Protein 6.3 (*)    Albumin 2.6 (*)    Alkaline Phosphatase 219 (*)    Total Bilirubin 1.8 (*)    GFR, Estimated 44 (*)    All other components within normal limits  CBC WITH DIFFERENTIAL/PLATELET - Abnormal; Notable for the following components:   WBC 18.7 (*)    RBC 3.59 (*)    Hemoglobin 9.7 (*)    HCT 29.8 (*)    Platelets 474 (*)    Neutro Abs 16.8 (*)    Lymphs Abs 0.5 (*)    Monocytes Absolute 1.2 (*)    Abs Immature Granulocytes 0.13 (*)    All other components within normal limits  CBC  CREATININE, SERUM  COMPREHENSIVE METABOLIC PANEL  CBC  MAGNESIUM  PHOSPHORUS    EKG None  Radiology CT ABDOMEN PELVIS W CONTRAST  Result Date: 10/10/2022 CLINICAL DATA:  Bowel obstruction suspected.  Prostate cancer. EXAM: CT ABDOMEN AND PELVIS WITH CONTRAST TECHNIQUE: Multidetector CT imaging of the abdomen and pelvis was performed using the standard protocol following bolus administration of intravenous contrast. RADIATION DOSE REDUCTION: This exam was performed according to the departmental dose-optimization program which includes automated exposure control, adjustment of the mA and/or kV according to patient size and/or use of iterative reconstruction technique. CONTRAST:  80mL OMNIPAQUE IOHEXOL 300 MG/ML  SOLN COMPARISON:  None Available. FINDINGS: Lower chest: There is a trace left pleural effusion. Hepatobiliary: No focal liver abnormality is seen. No gallstones, gallbladder wall thickening, or biliary dilatation. Pancreas: Unremarkable. No pancreatic ductal dilatation or surrounding inflammatory changes. Spleen: Normal in size without focal abnormality. Calcified granulomas are present. Adrenals/Urinary Tract: There is mild bilateral hydroureteronephrosis without obstructing calculus identified. Bladder and adrenal glands are within normal limits. Stomach/Bowel: There are multiple dilated loops of small bowel in the central abdomen measuring up to 3.7 cm in diameter. No definite  transition point visualized. The colon is nondilated. There is a large amount of stool in the rectum. The appendix is normal in size. Stomach is decompressed. A small hiatal hernia is present. Vascular/Lymphatic: Aorta and IVC are normal in size. No enlarged lymph nodes are identified. Reproductive: Prostate gland appears within normal limits. Other: There is large volume ascites throughout the abdomen and pelvis. There is some peritoneal wall enhancement in the pelvis and upper abdomen, indeterminate. There is no focal hernia. Musculoskeletal: Severe degenerative changes affect the spine. There are scattered rounded sclerotic lesions throughout the osseous structures worrisome for metastatic disease. IMPRESSION: 1. Multiple dilated loops of small bowel in the central abdomen worrisome for small bowel obstruction. No definite  transition point visualized. 2. Large volume ascites. There is peritoneal wall enhancement in the pelvis and upper abdomen, indeterminate. 3. Mild bilateral hydroureteronephrosis without obstructing calculus identified. 4. Trace left pleural effusion. 5. Sclerotic osseous lesions worrisome for metastatic disease. Electronically Signed   By: Darliss Cheney M.D.   On: 10/28/2022 21:03    Procedures Procedures    Medications Ordered in ED Medications  lactated ringers infusion (has no administration in time range)  heparin injection 5,000 Units (has no administration in time range)  prochlorperazine (COMPAZINE) injection 10 mg (has no administration in time range)  iohexol (OMNIPAQUE) 300 MG/ML solution 80 mL (80 mLs Intravenous Contrast Given 11/03/2022 2036)  sodium chloride 0.9 % bolus 1,000 mL (1,000 mLs Intravenous New Bag/Given 11/03/2022 2254)    ED Course/ Medical Decision Making/ A&P                             Medical Decision Making Amount and/or Complexity of Data Reviewed Labs: ordered. Radiology: ordered.  Risk Prescription drug management.   This patient presents  to the ED with chief complaint(s) of abdominal distension, suspected ileus with pertinent past medical history of prostate cancer, hypothyroidism, memory loss, GERD.  The complaint involves an extensive differential diagnosis and also carries with it a high risk of complications and morbidity.    The differential diagnosis includes small bowel obstruction, large bowel obstruction, diverticulitis, colitis, hepatitis, liver cirrhosis   The initial plan is to obtain baseline labs and CT of abdomen/pelvis  Additional history obtained: Additional history obtained from  patient's family member at bedside provides portion of HPI due to patient having poor memory.  She states he has had decreased appetite, but tolerating liquids okay.  He has increased belching and general unwell feeling.  Records reviewed  - patient seen at urgent care on 09/28/22 for lower abdominal pain and was determined he would be appropriate for observation.  No imaging obtained at that time.   Initial Assessment:   Exam significant for distended, tympanic abdomen that is non tender to palpation.  Bowel sounds are reduced.  Skin is normal color, warm and dry.  No jaundice or rashes.  Patient is resting comfortably and is not in acute distress.  He has no difficulty breathing, lungs clear to auscultation bilaterally.    Independent ECG/labs interpretation:  The following labs were independently interpreted:  CBC with leukocytosis and anemia.  Metabolic panel with multiple electrolyte disturbances, elevated creatinine, bilirubin and alk phos.  Anion gap normal.   Independent visualization and interpretation of imaging: I independently visualized the following imaging with scope of interpretation limited to determining acute life threatening conditions related to emergency care: CT abdomen pelvis, which revealed dilated loops of small bowel suggestive of bowel obstruction.  Large amount of ascites.  I agree with radiologist  interpretation.    Treatment and Reassessment: Patient given IV fluids and kept NPO due to bowel obstruction.  Patient is otherwise comfortable and continues to have no pain complaints.  He has not had any nausea or vomiting during ED stay.    Other treatment options considered:   Spoke with on-call provider with general surgery, Theophilus Kinds, who recommended admitting patient to medicine and they will consult.  If patient should start having nausea and vomiting, suggested NG tube placement, otherwise okay to hold off for now.  Recommended IV fluids and NPO.     I requested consultation with on-call hospitalist and spoke with  Dr. Thomes Dinning who agreed with hospital admission.    Disposition:   Patient to be admitted to [facility] for small bowel obstruction management.           Final Clinical Impression(s) / ED Diagnoses Final diagnoses:  Small bowel obstruction [facility])  Other ascites    Rx / DC Orders ED Discharge Orders     None         Lenard Simmer, PA-C 2022/10/14 2346    Bethann Berkshire, MD 10/12/22 1256

## 2022-10-11 NOTE — ED Notes (Signed)
Pt returned from CT Scan 

## 2022-10-12 ENCOUNTER — Inpatient Hospital Stay (HOSPITAL_COMMUNITY): Payer: Medicare Other

## 2022-10-12 DIAGNOSIS — K56609 Unspecified intestinal obstruction, unspecified as to partial versus complete obstruction: Secondary | ICD-10-CM | POA: Diagnosis not present

## 2022-10-12 DIAGNOSIS — D72829 Elevated white blood cell count, unspecified: Secondary | ICD-10-CM | POA: Insufficient documentation

## 2022-10-12 DIAGNOSIS — J9600 Acute respiratory failure, unspecified whether with hypoxia or hypercapnia: Secondary | ICD-10-CM | POA: Diagnosis not present

## 2022-10-12 DIAGNOSIS — A419 Sepsis, unspecified organism: Secondary | ICD-10-CM | POA: Diagnosis present

## 2022-10-12 DIAGNOSIS — R188 Other ascites: Secondary | ICD-10-CM | POA: Diagnosis not present

## 2022-10-12 DIAGNOSIS — E876 Hypokalemia: Secondary | ICD-10-CM | POA: Insufficient documentation

## 2022-10-12 DIAGNOSIS — R739 Hyperglycemia, unspecified: Secondary | ICD-10-CM | POA: Insufficient documentation

## 2022-10-12 DIAGNOSIS — R6521 Severe sepsis with septic shock: Secondary | ICD-10-CM

## 2022-10-12 DIAGNOSIS — E871 Hypo-osmolality and hyponatremia: Secondary | ICD-10-CM | POA: Insufficient documentation

## 2022-10-12 DIAGNOSIS — K567 Ileus, unspecified: Secondary | ICD-10-CM | POA: Diagnosis not present

## 2022-10-12 DIAGNOSIS — D75839 Thrombocytosis, unspecified: Secondary | ICD-10-CM | POA: Insufficient documentation

## 2022-10-12 DIAGNOSIS — E46 Unspecified protein-calorie malnutrition: Secondary | ICD-10-CM | POA: Insufficient documentation

## 2022-10-12 LAB — PHOSPHORUS: Phosphorus: 3.3 mg/dL (ref 2.5–4.6)

## 2022-10-12 LAB — CBC
HCT: 27.4 % — ABNORMAL LOW (ref 39.0–52.0)
Hemoglobin: 8.8 g/dL — ABNORMAL LOW (ref 13.0–17.0)
MCH: 27 pg (ref 26.0–34.0)
MCHC: 32.1 g/dL (ref 30.0–36.0)
MCV: 84 fL (ref 80.0–100.0)
Platelets: 437 10*3/uL — ABNORMAL HIGH (ref 150–400)
RBC: 3.26 MIL/uL — ABNORMAL LOW (ref 4.22–5.81)
RDW: 15.2 % (ref 11.5–15.5)
WBC: 19.2 10*3/uL — ABNORMAL HIGH (ref 4.0–10.5)
nRBC: 0 % (ref 0.0–0.2)

## 2022-10-12 LAB — COMPREHENSIVE METABOLIC PANEL
ALT: 25 U/L (ref 0–44)
AST: 39 U/L (ref 15–41)
Albumin: 2.3 g/dL — ABNORMAL LOW (ref 3.5–5.0)
Alkaline Phosphatase: 199 U/L — ABNORMAL HIGH (ref 38–126)
Anion gap: 10 (ref 5–15)
BUN: 20 mg/dL (ref 8–23)
CO2: 23 mmol/L (ref 22–32)
Calcium: 8 mg/dL — ABNORMAL LOW (ref 8.9–10.3)
Chloride: 100 mmol/L (ref 98–111)
Creatinine, Ser: 1.32 mg/dL — ABNORMAL HIGH (ref 0.61–1.24)
GFR, Estimated: 50 mL/min — ABNORMAL LOW (ref >60)
Glucose, Bld: 109 mg/dL — ABNORMAL HIGH (ref 70–99)
Potassium: 3.5 mmol/L (ref 3.5–5.1)
Sodium: 133 mmol/L — ABNORMAL LOW (ref 135–145)
Total Bilirubin: 2.2 mg/dL — ABNORMAL HIGH (ref 0.3–1.2)
Total Protein: 5.7 g/dL — ABNORMAL LOW (ref 6.5–8.1)

## 2022-10-12 LAB — GLUCOSE, CAPILLARY
Glucose-Capillary: 122 mg/dL — ABNORMAL HIGH (ref 70–99)
Glucose-Capillary: 113 mg/dL — ABNORMAL HIGH (ref 70–99)
Glucose-Capillary: 114 mg/dL — ABNORMAL HIGH (ref 70–99)
Glucose-Capillary: 116 mg/dL — ABNORMAL HIGH (ref 70–99)

## 2022-10-12 LAB — LACTIC ACID, PLASMA
Lactic Acid, Venous: 2.3 mmol/L (ref 0.5–1.9)
Lactic Acid, Venous: 3.3 mmol/L (ref 0.5–1.9)

## 2022-10-12 LAB — MAGNESIUM: Magnesium: 2.1 mg/dL (ref 1.7–2.4)

## 2022-10-12 LAB — CULTURE, BLOOD (ROUTINE X 2)

## 2022-10-12 LAB — MRSA NEXT GEN BY PCR, NASAL: MRSA by PCR Next Gen: NOT DETECTED

## 2022-10-12 MED ORDER — THYROID 60 MG PO TABS
120.0000 mg | ORAL_TABLET | Freq: Every day | ORAL | Status: DC
  Filled 2022-10-12 (×2): qty 1

## 2022-10-12 MED ORDER — MIDAZOLAM HCL 2 MG/2ML IJ SOLN
INTRAMUSCULAR | Status: AC
  Filled 2022-10-12: qty 2

## 2022-10-12 MED ORDER — VANCOMYCIN HCL 1500 MG/300ML IV SOLN
1500.0000 mg | INTRAVENOUS | Status: DC
  Administered 2022-10-12: 1500 mg via INTRAVENOUS
  Filled 2022-10-12: qty 300

## 2022-10-12 MED ORDER — ORAL CARE MOUTH RINSE
15.0000 mL | OROMUCOSAL | Status: DC
  Administered 2022-10-13 (×3): 15 mL via OROMUCOSAL

## 2022-10-12 MED ORDER — MIDAZOLAM HCL 2 MG/2ML IJ SOLN
2.0000 mg | Freq: Once | INTRAMUSCULAR | Status: AC
  Administered 2022-10-12: 2 mg via INTRAVENOUS

## 2022-10-12 MED ORDER — PANTOPRAZOLE SODIUM 40 MG IV SOLR
40.0000 mg | INTRAVENOUS | Status: DC
  Administered 2022-10-12: 40 mg via INTRAVENOUS
  Filled 2022-10-12: qty 10

## 2022-10-12 MED ORDER — LACTATED RINGERS IV SOLN: INTRAVENOUS | Status: DC

## 2022-10-12 MED ORDER — ORAL CARE MOUTH RINSE: 15.0000 mL | OROMUCOSAL | Status: DC | PRN

## 2022-10-12 MED ORDER — FENTANYL CITRATE PF 50 MCG/ML IJ SOSY: 25.0000 ug | PREFILLED_SYRINGE | INTRAMUSCULAR | Status: DC | PRN

## 2022-10-12 MED ORDER — ALBUMIN HUMAN 25 % IV SOLN
50.0000 g | Freq: Four times a day (QID) | INTRAVENOUS | Status: DC
  Administered 2022-10-12: 50 g via INTRAVENOUS
  Filled 2022-10-12: qty 200

## 2022-10-12 MED ORDER — SODIUM CHLORIDE 0.9 % IV SOLN: 250.0000 mL | INTRAVENOUS | Status: DC

## 2022-10-12 MED ORDER — FENTANYL CITRATE PF 50 MCG/ML IJ SOSY
25.0000 ug | PREFILLED_SYRINGE | Freq: Once | INTRAMUSCULAR | Status: AC
  Administered 2022-10-12: 25 ug via INTRAVENOUS

## 2022-10-12 MED ORDER — DEXMEDETOMIDINE HCL IN NACL 400 MCG/100ML IV SOLN
0.0000 ug/kg/h | INTRAVENOUS | Status: DC
  Administered 2022-10-13: 0.2 ug/kg/h via INTRAVENOUS
  Filled 2022-10-12: qty 100

## 2022-10-12 MED ORDER — FENTANYL CITRATE PF 50 MCG/ML IJ SOSY
PREFILLED_SYRINGE | INTRAMUSCULAR | Status: AC
  Filled 2022-10-12: qty 2

## 2022-10-12 MED ORDER — FENTANYL CITRATE PF 50 MCG/ML IJ SOSY: 12.5000 ug | PREFILLED_SYRINGE | INTRAMUSCULAR | Status: DC | PRN

## 2022-10-12 MED ORDER — ETOMIDATE 2 MG/ML IV SOLN
INTRAVENOUS | Status: AC
  Administered 2022-10-12: 10 mg
  Filled 2022-10-12: qty 20

## 2022-10-12 MED ORDER — IPRATROPIUM-ALBUTEROL 0.5-2.5 (3) MG/3ML IN SOLN
3.0000 mL | Freq: Four times a day (QID) | RESPIRATORY_TRACT | Status: DC | PRN
  Administered 2022-10-12: 3 mL via RESPIRATORY_TRACT
  Filled 2022-10-12: qty 3

## 2022-10-12 MED ORDER — FENTANYL BOLUS VIA INFUSION: 25.0000 ug | INTRAVENOUS | Status: DC | PRN

## 2022-10-12 MED ORDER — POLYETHYLENE GLYCOL 3350 17 G PO PACK: 17.0000 g | PACK | Freq: Every day | ORAL | Status: DC

## 2022-10-12 MED ORDER — VASOPRESSIN 20 UNITS/100 ML INFUSION FOR SHOCK
0.0000 [IU]/min | INTRAVENOUS | Status: DC
  Administered 2022-10-12 – 2022-10-13 (×2): 0.03 [IU]/min via INTRAVENOUS
  Filled 2022-10-12 (×2): qty 100

## 2022-10-12 MED ORDER — FENTANYL 2500MCG IN NS 250ML (10MCG/ML) PREMIX INFUSION
25.0000 ug/h | INTRAVENOUS | Status: DC
  Filled 2022-10-12: qty 250

## 2022-10-12 MED ORDER — NOREPINEPHRINE 4 MG/250ML-% IV SOLN
0.0000 ug/min | INTRAVENOUS | Status: DC
  Administered 2022-10-12: 2 ug/min via INTRAVENOUS
  Filled 2022-10-12: qty 250

## 2022-10-12 MED ORDER — SODIUM CHLORIDE 0.9 % IV SOLN
2.0000 g | INTRAVENOUS | Status: DC
  Administered 2022-10-12: 2 g via INTRAVENOUS
  Filled 2022-10-12 (×2): qty 12.5

## 2022-10-12 MED ORDER — SODIUM CHLORIDE 0.9 % IV SOLN: INTRAVENOUS | Status: AC

## 2022-10-12 MED ORDER — MIDAZOLAM HCL 2 MG/2ML IJ SOLN: 1.0000 mg | INTRAMUSCULAR | Status: DC | PRN

## 2022-10-12 MED ORDER — POTASSIUM CHLORIDE 10 MEQ/100ML IV SOLN
10.0000 meq | INTRAVENOUS | Status: AC
  Administered 2022-10-12 (×3): 10 meq via INTRAVENOUS
  Filled 2022-10-12 (×3): qty 100

## 2022-10-12 MED ORDER — NOREPINEPHRINE 4 MG/250ML-% IV SOLN: 0.0000 ug/min | INTRAVENOUS | Status: DC

## 2022-10-12 MED ORDER — INSULIN ASPART 100 UNIT/ML IJ SOLN: 0.0000 [IU] | INTRAMUSCULAR | Status: DC

## 2022-10-12 MED ORDER — SODIUM CHLORIDE 0.9 % IV BOLUS
500.0000 mL | Freq: Once | INTRAVENOUS | Status: AC
  Administered 2022-10-12: 500 mL via INTRAVENOUS

## 2022-10-12 MED ORDER — ROCURONIUM BROMIDE 10 MG/ML (PF) SYRINGE
PREFILLED_SYRINGE | INTRAVENOUS | Status: AC
  Filled 2022-10-12: qty 10

## 2022-10-12 MED ORDER — NOREPINEPHRINE 4 MG/250ML-% IV SOLN
0.0000 ug/min | INTRAVENOUS | Status: DC
  Administered 2022-10-12: 11 ug/min via INTRAVENOUS
  Administered 2022-10-13: 17 ug/min via INTRAVENOUS
  Filled 2022-10-12 (×2): qty 250

## 2022-10-12 MED ORDER — BISACODYL 10 MG RE SUPP
10.0000 mg | Freq: Every day | RECTAL | Status: DC
  Administered 2022-10-12: 10 mg via RECTAL
  Filled 2022-10-12: qty 1

## 2022-10-12 MED ORDER — CHLORHEXIDINE GLUCONATE CLOTH 2 % EX PADS
6.0000 | MEDICATED_PAD | Freq: Every day | CUTANEOUS | Status: DC
  Administered 2022-10-12: 6 via TOPICAL

## 2022-10-12 MED ORDER — FENTANYL CITRATE PF 50 MCG/ML IJ SOSY: 50.0000 ug | PREFILLED_SYRINGE | Freq: Once | INTRAMUSCULAR | Status: DC

## 2022-10-12 MED ORDER — ETOMIDATE 2 MG/ML IV SOLN: 10.0000 mg | Freq: Once | INTRAVENOUS | Status: AC

## 2022-10-12 MED ORDER — DOCUSATE SODIUM 50 MG/5ML PO LIQD: 100.0000 mg | Freq: Two times a day (BID) | ORAL | Status: DC

## 2022-10-12 MED ORDER — ROCURONIUM BROMIDE 10 MG/ML (PF) SYRINGE
70.0000 mg | PREFILLED_SYRINGE | Freq: Once | INTRAVENOUS | Status: AC
  Administered 2022-10-12: 70 mg via INTRAVENOUS

## 2022-10-12 MED ORDER — ACETAMINOPHEN 650 MG RE SUPP
650.0000 mg | Freq: Four times a day (QID) | RECTAL | Status: DC | PRN
  Administered 2022-10-12 – 2022-10-13 (×2): 650 mg via RECTAL
  Filled 2022-10-12 (×2): qty 1

## 2022-10-12 MED ORDER — SODIUM CHLORIDE 0.9 % IV SOLN
2.0000 g | INTRAVENOUS | Status: DC
  Administered 2022-10-12: 2 g via INTRAVENOUS
  Filled 2022-10-12: qty 20

## 2022-10-12 NOTE — H&P (Signed)
NAME:  LAWRNCE HEYES, MRN:  [id number], DOB:  09-10-1925, LOS: 1 ADMISSION DATE:  10/18/2022, CONSULTATION DATE:  5/4 REFERRING MD:  Dr. Sherryll Burger, CHIEF COMPLAINT:  Septic shock   History of Present Illness:  89 yo male from SNF to [facility] with poor appetite and abdominal distention.  KUB concerning for ileus and CT abd for possible SBO.  Also found to have new ascites.  Seen by GI and surgery.  Developed fever and leukocytosis.  Started on Abx for sepsis with concern for possible SBP.  Became hypotensive and started on pressors.  Transferred to [facility].  Had respiratory distress and required intubation.  Pertinent  Medical History  OA, Melanoma, Prostate cancer, GERD, Hepatitis, Hypothyroidism, Memory loss  Significant Hospital Events: Including procedures, antibiotic start and stop dates in addition to other pertinent events   5/3 patient w/ SBO; admitted to [facility] 5/4 worsening sepsis; concern for sbp given ascites; on levo; transferring to [facility] and intubated  Interim History / Subjective:  Hx from chart and medical team.  Objective   Blood pressure (!) 74/47, pulse (!) 115, temperature (!) 101.3 F (38.5 C), temperature source Axillary, resp. rate (!) 33, height 5\' 6"  (1.676 m), weight 75.8 kg, SpO2 97 %.    FiO2 (%):  [21 %] 21 %   Intake/Output Summary (Last 24 hours) at 10/12/2022 1933 Last data filed at 10/12/2022 1515 Gross per 24 hour  Intake 1337.14 ml  Output 200 ml  Net 1137.14 ml   Filed Weights   10/31/2022 1831 10/12/22 0101 10/12/22 1824  Weight: 78.5 kg 72.9 kg 75.8 kg    Examination:  General - agitated Eyes - pupils reactive ENT - dry mucosa, audible wheezing Cardiac - regular, tachycardic Chest - poor air movement, increased WOB with accessory muscle use Abdomen - distended, increased tympany Extremities - 1+ edema Skin - no rashes Neuro - agitated  Resolved Hospital Problem list     Assessment & Plan:   Acute hypoxic respiratory failure with concern for  aspiration pneumonitis. - intubated - f/u CXR, ABG - goal SpO2 > 92%  Septic shock with unclear source. - possible sources are aspiration pneumonitis, SBP with new ascites, UTI - check sputum culture, U/A - f/u blood culture from 10/12/22 - continue vancomycin, cefepime - pressors to keep MAP > 65 - continue IV fluids  Possible SBO. - OG tube to suction - f/u abdominal xray - seen by GI and surgery at [facility]  New ascites. - assess for paracentesis with bedside ultrasound  Anemia of critical illness. - f/u CBC  Hx of hypothyroidism - check TSH - hold NP thyroid while NPO  Best Practice (right click and "Reselect all SmartList Selections" daily)   Diet/type: NPO DVT prophylaxis: prophylactic heparin  GI prophylaxis: PPI Lines: Central line Foley:  Yes, and it is still needed Code Status:  full code Last date of multidisciplinary goals of care discussion [updated pt's family by phone.  His [birth year] is on [birth year].  Family plans to come in on 10/22/22 and reassess status]  Labs   CBC: Recent Labs  Lab 11/03/2022 1849 10/12/22 0541  WBC 18.7* 19.2*  NEUTROABS 16.8*  --   HGB 9.7* 8.8*  HCT 29.8* 27.4*  MCV 83.0 84.0  PLT 474* 437*    Basic Metabolic Panel: Recent Labs  Lab 11/06/2022 1849 10/12/22 0541  NA 132* 133*  K 3.4* 3.5  CL 96* 100  CO2 25 23  GLUCOSE 169* 109*  BUN 23 20  CREATININE 1.46* 1.32*  CALCIUM 8.2* 8.0*  MG  --  2.1  PHOS  --  3.3   GFR: Estimated Creatinine Clearance: 30.9 mL/min (A) (by C-G formula based on SCr of 1.32 mg/dL (H)). Recent Labs  Lab 10/14/2022 1849 10/12/22 0541 10/12/22 1323 10/12/22 1554  WBC 18.7* 19.2*  --   --   LATICACIDVEN  --   --  2.3* 3.3*    Liver Function Tests: Recent Labs  Lab 11/08/2022 1849 10/12/22 0541  AST 39 39  ALT 29 25  ALKPHOS 219* 199*  BILITOT 1.8* 2.2*  PROT 6.3* 5.7*  ALBUMIN 2.6* 2.3*   No results for input(s): "LIPASE", "AMYLASE" in the last 168 hours. No results for input(s):  "AMMONIA" in the last 168 hours.  ABG    Component Value Date/Time   TCO2 28 10/27/2017 1928     Coagulation Profile: No results for input(s): "INR", "PROTIME" in the last 168 hours.  Cardiac Enzymes: No results for input(s): "CKTOTAL", "CKMB", "CKMBINDEX", "TROPONINI" in the last 168 hours.  HbA1C: No results found for: "HGBA1C"  CBG: Recent Labs  Lab 10/12/22 1210 10/12/22 1727 10/12/22 1802  GLUCAP 122* 113* 114*    Review of Systems:   Unable to obtain.  Past Medical History:  He,  has a past medical history of Arthritis, Cancer (HCC), GERD (gastroesophageal reflux disease), Hepatitis, Hypothyroidism, Memory loss, PONV (postoperative nausea and vomiting), and Prostate CA (HCC).   Surgical History:   Past Surgical History:  Procedure Laterality Date   BIOPSY  09/08/2020   Procedure: BIOPSY;  Surgeon: Dolores Frame, MD;  [facility];  Service: Gastroenterology;;   BIOPSY  03/20/2021   Procedure: BIOPSY;  Surgeon: Dolores Frame, MD;  [facility];  Service: Gastroenterology;;   CATARACT EXTRACTION W/PHACO  12/16/2011   Procedure: CATARACT EXTRACTION PHACO AND INTRAOCULAR LENS PLACEMENT (IOC);  Surgeon: Gemma Payor, MD;  Location: [facility];  Service: Ophthalmology;  Laterality: Left;  CDE:17.24    CATARACT EXTRACTION W/PHACO  12/30/2011   Procedure: CATARACT EXTRACTION PHACO AND INTRAOCULAR LENS PLACEMENT (IOC);  Surgeon: Gemma Payor, MD;  Location: [facility];  Service: Ophthalmology;  Laterality: Right;  CDE 15.40   COLONOSCOPY     COLONOSCOPY N/A 08/07/2012   Procedure: COLONOSCOPY;  Surgeon: Malissa Hippo, MD;  [facility];  Service: Endoscopy;  Laterality: N/A;  10:00   COLONOSCOPY N/A 11/22/2015   Procedure: COLONOSCOPY;  Surgeon: Malissa Hippo, MD;  [facility];  Service: Endoscopy;  Laterality: N/A;  12:00 - moved to 10:30 - Ann to notify   ESOPHAGEAL DILATION N/A 02/10/2015   Procedure: ESOPHAGEAL  DILATION;  Surgeon: Malissa Hippo, MD;  [facility];  Service: Endoscopy;  Laterality: N/A;   ESOPHAGEAL DILATION N/A 03/29/2015   Procedure: ESOPHAGEAL DILATION;  Surgeon: Malissa Hippo, MD;  [facility];  Service: Endoscopy;  Laterality: N/A;   ESOPHAGEAL DILATION  11/22/2015   Procedure: ESOPHAGEAL DILATION;  Surgeon: Malissa Hippo, MD;  [facility];  Service: Endoscopy;;   ESOPHAGEAL DILATION N/A 11/19/2017   Procedure: ESOPHAGEAL DILATION;  Surgeon: Malissa Hippo, MD;  [facility];  Service: Endoscopy;  Laterality: N/A;   ESOPHAGEAL DILATION N/A 12/29/2017   Procedure: ESOPHAGEAL DILATION;  Surgeon: Malissa Hippo, MD;  [facility];  Service: Endoscopy;  Laterality: N/A;   ESOPHAGEAL DILATION N/A 09/08/2020   Procedure: ESOPHAGEAL DILATION;  Surgeon: Marguerita Merles,  Reuel Boom, MD;  [facility];  Service: Gastroenterology;  Laterality: N/A;   ESOPHAGEAL DILATION N/A 03/20/2021   Procedure: ESOPHAGEAL DILATION;  Surgeon: Dolores Frame, MD;  [facility];  Service: Gastroenterology;  Laterality: N/A;   ESOPHAGOGASTRODUODENOSCOPY N/A 02/10/2015   Procedure: ESOPHAGOGASTRODUODENOSCOPY (EGD);  Surgeon: Malissa Hippo, MD;  [facility];  Service: Endoscopy;  Laterality: N/A;  925 - moved to 10:20 - Ann to notify pt   ESOPHAGOGASTRODUODENOSCOPY N/A 03/29/2015   Procedure: ESOPHAGOGASTRODUODENOSCOPY (EGD);  Surgeon: Malissa Hippo, MD;  [facility];  Service: Endoscopy;  Laterality: N/A;  145 - moved to 8:30 - Ann notified pt   ESOPHAGOGASTRODUODENOSCOPY N/A 11/22/2015   Procedure: ESOPHAGOGASTRODUODENOSCOPY (EGD);  Surgeon: Malissa Hippo, MD;  [facility];  Service: Endoscopy;  Laterality: N/A;   ESOPHAGOGASTRODUODENOSCOPY N/A 10/27/2017   Procedure: ESOPHAGOGASTRODUODENOSCOPY (EGD);  Surgeon: Corbin Ade, MD;  [facility];  Service: Endoscopy;   Laterality: N/A;  removal of food impaction   ESOPHAGOGASTRODUODENOSCOPY N/A 11/19/2017   Procedure: ESOPHAGOGASTRODUODENOSCOPY (EGD);  Surgeon: Malissa Hippo, MD;  [facility];  Service: Endoscopy;  Laterality: N/A;  7:30   ESOPHAGOGASTRODUODENOSCOPY N/A 12/29/2017   Procedure: ESOPHAGOGASTRODUODENOSCOPY (EGD);  Surgeon: Malissa Hippo, MD;  [facility];  Service: Endoscopy;  Laterality: N/A;  730   ESOPHAGOGASTRODUODENOSCOPY (EGD) WITH PROPOFOL N/A 09/08/2020   Procedure: ESOPHAGOGASTRODUODENOSCOPY (EGD) WITH PROPOFOL;  Surgeon: Dolores Frame, MD;  [facility];  Service: Gastroenterology;  Laterality: N/A;  am   ESOPHAGOGASTRODUODENOSCOPY (EGD) WITH PROPOFOL N/A 02/06/2021   Procedure: ESOPHAGOGASTRODUODENOSCOPY (EGD) WITH PROPOFOL;  Surgeon: Dolores Frame, MD;  [facility];  Service: Gastroenterology;  Laterality: N/A;   ESOPHAGOGASTRODUODENOSCOPY (EGD) WITH PROPOFOL N/A 03/20/2021   Procedure: ESOPHAGOGASTRODUODENOSCOPY (EGD) WITH PROPOFOL;  Surgeon: Dolores Frame, MD;  [facility];  Service: Gastroenterology;  Laterality: N/A;  10:55   HERNIA REPAIR     HYDROCELE EXCISION     bilateral   JOINT REPLACEMENT     left knee 9/12   MELANOMA EXCISION N/A 06/11/2017   Procedure: WIDE LOCAL EXCISION WITH ADVANCEMENT FLAP CLOSURE LEFT NECK MELANOMA;  Surgeon: Almond Lint, MD;  Location: [facility];  Service: General;  Laterality: N/A;  GENERAL AND LOCAL   TOTAL KNEE ARTHROPLASTY  06/24/2011   Procedure: TOTAL KNEE ARTHROPLASTY;  Surgeon: Loanne Drilling;  Location: [facility];  Service: Orthopedics;  Laterality: Right;     Social History:   reports that he quit smoking about 60 years ago. His smoking use included cigarettes. He has a 30.00 pack-year smoking history. He quit smokeless tobacco use about 16 years ago.  His smokeless tobacco use included chew. He reports that he does not drink  alcohol and does not use drugs.   Family History:  His family history includes Breast cancer in his mother; Cirrhosis in his brother; Healthy in his daughter and son; Liver cancer in his brother; Nephritis in his brother; Prostate cancer in his brother; Rectal cancer in his brother.   Allergies Allergies  Allergen Reactions   Ciprofloxacin Hives    Hives at IV infusion site   Penicillins Other (See Comments)    Reaction: large knots on head Has patient had a PCN reaction causing immediate rash, facial/tongue/throat swelling, SOB or lightheadedness with hypotension: No Has patient had a PCN reaction causing severe rash involving mucus membranes or skin necrosis: No Has patient had a  PCN reaction that required hospitalization No Has patient had a PCN reaction occurring within the last 10 years: No If all of the above answers are "NO", then may proceed with Cephalosporin use.    Sulfa Antibiotics Hives     Home Medications  Prior to Admission medications   Medication Sig Start Date End Date Taking? Authorizing Provider  amoxicillin-clavulanate (AUGMENTIN) 875-125 MG tablet Take 1 tablet every 12 hours by oral route for 5 days.    [provider]  benzonatate (TESSALON) 100 MG capsule Take 1 capsule (100 mg total) by mouth 3 (three) times daily as needed. 06/01/21   Margaretann Loveless, PA-C  NP THYROID 120 MG tablet TAKE 1 TABLET(120 MG) BY MOUTH DAILY Patient taking differently: Take 120 mg by mouth daily at 6 (six) AM. 11/02/20   Wilson Singer, MD  omeprazole (PRILOSEC) 40 MG capsule Take 1 capsule (40 mg total) by mouth 2 (two) times daily. 02/07/21   Rodolph Bong, MD  triamcinolone (KENALOG) 0.1 % Apply 1 application topically 2 (two) times daily. Patient not taking: No sig reported 05/02/20   Wilson Singer, MD     Critical care time: 48 minutes independent of procedure time.  Coralyn Helling, MD [facility] Pulmonary/Critical Care Pager - [phone] or [phone] 10/12/2022, 11:18 PM

## 2022-10-12 NOTE — Consult Note (Signed)
Katrinka Blazing, M.D. Gastroenterology & Hepatology                                           Patient Name: Evan Miller Account #: @FLAACCTNO @   MRN: [id number] Admission Date: 11/03/22 Date of Evaluation:  10/12/2022 Time of Evaluation: 8:00 AM  Referring Physician: Maurilio Lovely, DO  Chief Complaint: Ascites, abdominal distention and small bowel obstruction  HPI:  This is a 89 y.o. male with history of melanoma, prostate cancer, basal cell carcinoma, GERD, hypothyroidism, memory loss, who was brought to the hospital from SNF after presenting worsening abdominal distention and abdominal pain.  Patient is a poor historian.  He reported he has had abdominal pain and distention but does not remember when this started.  Patient denies any fever, chills, nausea or vomiting, melena or hematochezia.  History is obtained from medical record -it seems that the patient has presented his symptoms for the last 2 weeks.  Patient had worsening distention, for which x-ray was performed at [facility] which was concerning for ileus.  In the ED, he was HD stable and afebrile. Labs were remarkable for WBC 18.7, hemoglobin 9.7, MCV 83, platelets 474, CMP with sodium 132, potassium 3.4, creatinine 1.46, BUN 23, alkaline phosphatase 219, total bili 1.8, AST 39, ALT 29.  A CT of the abdomen and pelvis with IV contrast was performed which showed presence of large volume ascites with enhancement of the peritoneal wall in the pelvis and upper abdomen.  No abnormality in the liver was visualized which I personally confirmed.  There was presence of multiple dilated small bowel loops without transition point.  Also, there was presence of multiple sclerotic lesions in the spine concerning for malignancy.  There was presence of significant amount of stool in the rectum.  General surgery was consulted and recommended GI involvement in this patient's care.  Past Medical History: SEE CHRONIC ISSSUES: Past Medical History:   Diagnosis Date   Arthritis    back,shoulders and hips   Cancer (HCC)    basal cell/ Melanoma 1997 left  shoulder/  PROSTATE   GERD (gastroesophageal reflux disease)    Hepatitis    age 10 years   Hypothyroidism    Memory loss    PONV (postoperative nausea and vomiting)    Prostate CA [facility])    Past Surgical History:  Past Surgical History:  Procedure Laterality Date   BIOPSY  09/08/2020   Procedure: BIOPSY;  Surgeon: Dolores Frame, MD;  [facility];  Service: Gastroenterology;;   BIOPSY  03/20/2021   Procedure: BIOPSY;  Surgeon: Dolores Frame, MD;  [facility];  Service: Gastroenterology;;   CATARACT EXTRACTION W/PHACO  12/16/2011   Procedure: CATARACT EXTRACTION PHACO AND INTRAOCULAR LENS PLACEMENT (IOC);  Surgeon: Gemma Payor, MD;  [facility]: [facility];  Service: Ophthalmology;  Laterality: Left;  CDE:17.24    CATARACT EXTRACTION W/PHACO  12/30/2011   Procedure: CATARACT EXTRACTION PHACO AND INTRAOCULAR LENS PLACEMENT (IOC);  Surgeon: Gemma Payor, MD;  Location: [facility];  Service: Ophthalmology;  Laterality: Right;  CDE 15.40   COLONOSCOPY     COLONOSCOPY N/A 08/07/2012   Procedure: COLONOSCOPY;  Surgeon: Malissa Hippo, MD;  [facility];  Service: Endoscopy;  Laterality: N/A;  10:00   COLONOSCOPY N/A 11/22/2015   Procedure: COLONOSCOPY;  Surgeon: Malissa Hippo, MD;  [facility];  Service: Endoscopy;  Laterality: N/A;  12:00 - moved to 10:30 - Ann to notify   ESOPHAGEAL DILATION N/A 02/10/2015   Procedure: ESOPHAGEAL DILATION;  Surgeon: Malissa Hippo, MD;  [facility];  Service: Endoscopy;  Laterality: N/A;   ESOPHAGEAL DILATION N/A 03/29/2015   Procedure: ESOPHAGEAL DILATION;  Surgeon: Malissa Hippo, MD;  [facility];  Service: Endoscopy;  Laterality: N/A;   ESOPHAGEAL DILATION  11/22/2015   Procedure: ESOPHAGEAL DILATION;  Surgeon: Malissa Hippo, MD;  [facility];  Service:  Endoscopy;;   ESOPHAGEAL DILATION N/A 11/19/2017   Procedure: ESOPHAGEAL DILATION;  Surgeon: Malissa Hippo, MD;  [facility];  Service: Endoscopy;  Laterality: N/A;   ESOPHAGEAL DILATION N/A 12/29/2017   Procedure: ESOPHAGEAL DILATION;  Surgeon: Malissa Hippo, MD;  [facility];  Service: Endoscopy;  Laterality: N/A;   ESOPHAGEAL DILATION N/A 09/08/2020   Procedure: ESOPHAGEAL DILATION;  Surgeon: Dolores Frame, MD;  [facility];  Service: Gastroenterology;  Laterality: N/A;   ESOPHAGEAL DILATION N/A 03/20/2021   Procedure: ESOPHAGEAL DILATION;  Surgeon: Dolores Frame, MD;  [facility];  Service: Gastroenterology;  Laterality: N/A;   ESOPHAGOGASTRODUODENOSCOPY N/A 02/10/2015   Procedure: ESOPHAGOGASTRODUODENOSCOPY (EGD);  Surgeon: Malissa Hippo, MD;  [facility];  Service: Endoscopy;  Laterality: N/A;  925 - moved to 10:20 - Ann to notify pt   ESOPHAGOGASTRODUODENOSCOPY N/A 03/29/2015   Procedure: ESOPHAGOGASTRODUODENOSCOPY (EGD);  Surgeon: Malissa Hippo, MD;  [facility];  Service: Endoscopy;  Laterality: N/A;  145 - moved to 8:30 - Ann notified pt   ESOPHAGOGASTRODUODENOSCOPY N/A 11/22/2015   Procedure: ESOPHAGOGASTRODUODENOSCOPY (EGD);  Surgeon: Malissa Hippo, MD;  [facility];  Service: Endoscopy;  Laterality: N/A;   ESOPHAGOGASTRODUODENOSCOPY N/A 10/27/2017   Procedure: ESOPHAGOGASTRODUODENOSCOPY (EGD);  Surgeon: Corbin Ade, MD;  [facility];  Service: Endoscopy;  Laterality: N/A;  removal of food impaction   ESOPHAGOGASTRODUODENOSCOPY N/A 11/19/2017   Procedure: ESOPHAGOGASTRODUODENOSCOPY (EGD);  Surgeon: Malissa Hippo, MD;  [facility];  Service: Endoscopy;  Laterality: N/A;  7:30   ESOPHAGOGASTRODUODENOSCOPY N/A 12/29/2017   Procedure: ESOPHAGOGASTRODUODENOSCOPY (EGD);  Surgeon: Malissa Hippo, MD;  [facility];  Service: Endoscopy;   Laterality: N/A;  730   ESOPHAGOGASTRODUODENOSCOPY (EGD) WITH PROPOFOL N/A 09/08/2020   Procedure: ESOPHAGOGASTRODUODENOSCOPY (EGD) WITH PROPOFOL;  Surgeon: Dolores Frame, MD;  [facility];  Service: Gastroenterology;  Laterality: N/A;  am   ESOPHAGOGASTRODUODENOSCOPY (EGD) WITH PROPOFOL N/A 02/06/2021   Procedure: ESOPHAGOGASTRODUODENOSCOPY (EGD) WITH PROPOFOL;  Surgeon: Dolores Frame, MD;  [facility];  Service: Gastroenterology;  Laterality: N/A;   ESOPHAGOGASTRODUODENOSCOPY (EGD) WITH PROPOFOL N/A 03/20/2021   Procedure: ESOPHAGOGASTRODUODENOSCOPY (EGD) WITH PROPOFOL;  Surgeon: Dolores Frame, MD;  [facility];  Service: Gastroenterology;  Laterality: N/A;  10:55   HERNIA REPAIR     HYDROCELE EXCISION     bilateral   JOINT REPLACEMENT     left knee 9/12   MELANOMA EXCISION N/A 06/11/2017   Procedure: WIDE LOCAL EXCISION WITH ADVANCEMENT FLAP CLOSURE LEFT NECK MELANOMA;  Surgeon: Almond Lint, MD;  Location: [facility];  Service: General;  Laterality: N/A;  GENERAL AND LOCAL   TOTAL KNEE ARTHROPLASTY  06/24/2011   Procedure: TOTAL KNEE ARTHROPLASTY;  Surgeon: Loanne Drilling;  Location: [facility];  Service: Orthopedics;  Laterality: Right;  Family History:  Family History  Problem Relation Age of Onset   Breast cancer Mother    Prostate cancer Brother    Nephritis Brother    Rectal cancer Brother    Cirrhosis Brother    Liver cancer Brother    Healthy Daughter    Healthy Son    Social History:  Social History   Tobacco Use   Smoking status: Former    Packs/day: 1.00    Years: 30.00    Additional pack years: 0.00    Total pack years: 30.00    Types: Cigarettes    Quit date: 06/16/1962    Years since quitting: 60.3   Smokeless tobacco: Former    Types: Chew    Quit date: 06/16/2006  Vaping Use   Vaping Use: Never used  Substance Use Topics   Alcohol use: No    Comment: quit  60 yrs ago   Drug  use: No    Home Medications:  Prior to Admission medications   Medication Sig Start Date End Date Taking? Authorizing Provider  amoxicillin-clavulanate (AUGMENTIN) 875-125 MG tablet Take 1 tablet every 12 hours by oral route for 5 days.    [provider]  benzonatate (TESSALON) 100 MG capsule Take 1 capsule (100 mg total) by mouth 3 (three) times daily as needed. 06/01/21   Margaretann Loveless, PA-C  NP THYROID 120 MG tablet TAKE 1 TABLET(120 MG) BY MOUTH DAILY Patient taking differently: Take 120 mg by mouth daily at 6 (six) AM. 11/02/20   Wilson Singer, MD  omeprazole (PRILOSEC) 40 MG capsule Take 1 capsule (40 mg total) by mouth 2 (two) times daily. 02/07/21   Rodolph Bong, MD  triamcinolone (KENALOG) 0.1 % Apply 1 application topically 2 (two) times daily. Patient not taking: No sig reported 05/02/20   Wilson Singer, MD    Inpatient Medications:  Current Facility-Administered Medications:    heparin injection 5,000 Units, 5,000 Units, Subcutaneous, Q8H, Adefeso, Oladapo, DO, 5,000 Units at 10/12/22 0124   lactated ringers infusion, , Intravenous, Continuous, Adefeso, Oladapo, DO, Last Rate: 75 mL/hr at 10/12/22 0253, New Bag at 10/12/22 0253   Oral care mouth rinse, 15 mL, Mouth Rinse, PRN, Adefeso, Oladapo, DO   prochlorperazine (COMPAZINE) injection 10 mg, 10 mg, Intravenous, Q6H PRN, Adefeso, Oladapo, DO Allergies: Ciprofloxacin, Penicillins, and Sulfa antibiotics  Complete Review of Systems: GENERAL: negative for malaise, night sweats HEENT: No changes in hearing or vision, no nose bleeds or other nasal problems. NECK: Negative for lumps, goiter, pain and significant neck swelling RESPIRATORY: Negative for cough, wheezing CARDIOVASCULAR: Negative for chest pain, leg swelling, palpitations, orthopnea GI: SEE HPI MUSCULOSKELETAL: Negative for joint pain or swelling, back pain, and muscle pain. SKIN: Negative for lesions, rash PSYCH: Negative for sleep  disturbance, mood disorder and recent psychosocial stressors. HEMATOLOGY Negative for prolonged bleeding, bruising easily, and swollen nodes. ENDOCRINE: Negative for cold or heat intolerance, polyuria, polydipsia and goiter. NEURO: negative for tremor, gait imbalance, syncope and seizures. The remainder of the review of systems is noncontributory.  Physical Exam: BP (!) 91/58 (BP Location: Right Arm)   Pulse 97   Temp 98.3 F (36.8 C) (Oral)   Resp 18   Ht 5\' 3"  (1.6 m)   Wt 72.9 kg   SpO2 97%   BMI 28.47 kg/m  GENERAL: The patient is AO x2, in no acute distress. HEENT: Head is normocephalic and atraumatic. EOMI are intact. Mouth is well hydrated and without lesions. NECK: Supple.  No masses LUNGS: Clear to auscultation. No presence of rhonchi/wheezing/rales. Adequate chest expansion HEART: RRR, normal s1 and s2. ABDOMEN: mildly tender upon diffuse palpation, no guarding, no peritoneal signs. Moderately distended BS +. No masses. EXTREMITIES: Without any cyanosis, clubbing, rash, lesions or edema. NEUROLOGIC: AOx2, no focal motor deficit. SKIN: no jaundice, no rashes  Laboratory Data CBC:     Component Value Date/Time   WBC 19.2 (H) 10/12/2022 0541   RBC 3.26 (L) 10/12/2022 0541   HGB 8.8 (L) 10/12/2022 0541   HCT 27.4 (L) 10/12/2022 0541   PLT 437 (H) 10/12/2022 0541   MCV 84.0 10/12/2022 0541   MCH 27.0 10/12/2022 0541   MCHC 32.1 10/12/2022 0541   RDW 15.2 10/12/2022 0541   LYMPHSABS 0.5 (L) 10/09/2022 1849   MONOABS 1.2 (H) 11/08/2022 1849   EOSABS 0.0 10/12/2022 1849   BASOSABS 0.0 10/29/2022 1849   COAG:  Lab Results  Component Value Date   INR 1.0 02/07/2021   INR 0.90 06/11/2017   INR 1.01 06/17/2011    BMP:     Latest Ref Rng & Units 10/12/2022    5:41 AM 10/20/2022    6:49 PM 02/07/2021    6:04 AM  BMP  Glucose 70 - 99 mg/dL 161  096  045   BUN 8 - 23 mg/dL 20  23  17    Creatinine 0.61 - 1.24 mg/dL 4.09  8.11  9.14   Sodium 135 - 145 mmol/L 133  132   140   Potassium 3.5 - 5.1 mmol/L 3.5  3.4  4.2   Chloride 98 - 111 mmol/L 100  96  103   CO2 22 - 32 mmol/L 23  25  26    Calcium 8.9 - 10.3 mg/dL 8.0  8.2  9.2     HEPATIC:     Latest Ref Rng & Units 10/12/2022    5:41 AM 10/25/2022    6:49 PM 02/07/2021    6:04 AM  Hepatic Function  Total Protein 6.5 - 8.1 g/dL 5.7  6.3  6.6   Albumin 3.5 - 5.0 g/dL 2.3  2.6  3.8   AST 15 - 41 U/L 39  39  20   ALT 0 - 44 U/L 25  29  15    Alk Phosphatase 38 - 126 U/L 199  219  71   Total Bilirubin 0.3 - 1.2 mg/dL 2.2  1.8  0.5     CARDIAC:  Lab Results  Component Value Date   TROPONINI <0.03 04/07/2017     Imaging: I personally reviewed and interpreted the available imaging.  Assessment & Plan: ASHDYN HALLUM is a 89 y.o. male with history of melanoma, prostate cancer, basal cell carcinoma, GERD, hypothyroidism, memory loss, who was brought to the hospital from SNF after presenting worsening abdominal distention and abdominal pain.  CT scan showed presence of dilated small bowel loops without masses or transition point on imaging.  Patient was found to have new onset of large ascites with presence of enhancement of the peritoneal wall.  He does not have any stigmata of chronic liver disease on imaging.  It is possible that he is presenting with ileus symptoms, which could be reactive to the amount of ascites he is currently presenting.  Etiology of ascites is unclear and needs to be further evaluated with a diagnostic/therapeutic paracentesis.  Most concerning issue at the moment is malignancy, but will evaluate for other etiologies that may lead to portal hypertension.  Will recommend proceeding  with a fecal disimpaction as this may help alleviate some of the patient's symptoms.  -Diagnostic/therapeutic paracentesis -please obtain cell count, Gram stain, culture, cytology, albumin, protein and glucose in fluid -Perform fecal disimpaction today -Minimize opioid use -If possible, encourage  ambulation -Appreciate general surgery recommendations  Katrinka Blazing, MD Gastroenterology and Hepatology [facility]

## 2022-10-12 NOTE — Progress Notes (Signed)
Patient has progressively become septic throughout the course of the day and is now febrile with temp 102F with tachypnea, tachycardia, and hypotension. Lactic acid is uptrending despite initiation of IVF and Rocephin. He will be transferred to ICU with norepinephrine ordered. Discussed with daughter, Corrie Dandy, who understands the plan and confirms full code status.   Fluid bolus ordered and antibiotics escalated to cefepime and vancomycin. RN instructed to place NG tube per general surgery recommendation. Will order albumin as well to assist with BP management. Dr. Mariea Clonts at bedside to evaluate.  [facility] notified to cancel transfer until pt further stabilized and appropriate bed is obtained at [facility].  Critical care time: 40 minutes.

## 2022-10-12 NOTE — Progress Notes (Signed)
Order placed for disimpaction, checked patients vitals due to BP running soft. Blood pressure 93/58 via vital sign machine and 90/58 via manual. MD Sherryll Burger made aware. No new orders. Noted stool soft, brown and minimum amount removed. Patient started to have bowel movement but unable to push stool out. Patient verbalized no complaints. MD Sherryll Burger made aware.

## 2022-10-12 NOTE — Progress Notes (Signed)
Family reported patient had emesis after eating lunch. MD Sherryll Burger and Pappayliou made aware. New orders placed. NG tube placed at 1506 by this writer and [place] RN. Patient reported get this thing out, informed patient for the reason for NG tube. At 1521 family called this writer to room, patient had pulled NG tube out. MD Sherryll Burger and Pappayliou made aware. Per Pappayliou hold off on placing NG tube unless patient has another emesis. Informed nurse receiving patient at [facility] RN.

## 2022-10-12 NOTE — Progress Notes (Signed)
   10/12/22 1643  Assess: MEWS Score  Temp 98.7 F (37.1 C)  BP 115/60  MAP (mmHg) 70  Pulse Rate (!) 114  Resp 18  SpO2 95 %  O2 Device Room Air  Assess: MEWS Score  MEWS Temp 0  MEWS Systolic 0  MEWS Pulse 2  MEWS RR 0  MEWS LOC 0  MEWS Score 2  MEWS Score Color Yellow  Assess: if the MEWS score is Yellow or Red  Were vital signs taken at a resting state? Yes  Focused Assessment Change from prior assessment (see assessment flowsheet)  Does the patient meet 2 or more of the SIRS criteria? No  MEWS guidelines implemented  Yes, yellow  Treat  MEWS Interventions Considered administering scheduled or prn medications/treatments as ordered  Take Vital Signs  Increase Vital Sign Frequency  Yellow: Q2hr x1, continue Q4hrs until patient remains green for 12hrs  Escalate  MEWS: Escalate Yellow: Discuss with charge nurse and consider notifying provider and/or RRT  Notify: Charge Nurse/RN  Name of Charge Nurse/RN Notified [place] RN  Provider Notification  Provider Name/Title MD Sherryll Burger  Date Provider Notified 10/12/22  Time Provider Notified 1651  Method of Notification Page (Yellow MEWs)  Notification Reason Other (Comment) (yellow MEWs)  Provider response No new orders  Date of Provider Response 10/12/22  Time of Provider Response 1652  Assess: SIRS CRITERIA  SIRS Temperature  0  SIRS Pulse 1  SIRS Respirations  0  SIRS WBC 0  SIRS Score Sum  1

## 2022-10-12 NOTE — Procedures (Signed)
Central Venous Catheter Insertion Procedure Note  ERICSSON SCIORTINO  [id number]  [birth year]  Date:10/12/22  Time:11:24 PM   Provider Performing:Kriss Ishler D Suzie Portela   Procedure: Insertion of Non-tunneled Central Venous [phone]) with US guidance ([id number])   Indication(s) Medication administration  Consent Risks of the procedure as well as the alternatives and risks of each were explained to the patient and/or caregiver.  Consent for the procedure was obtained and is signed in the bedside chart  Anesthesia Topical only with 1% lidocaine   Timeout Verified patient identification, verified procedure, site/side was marked, verified correct patient position, special equipment/implants available, medications/allergies/relevant history reviewed, required imaging and test results available.  Sterile Technique Maximal sterile technique including full sterile barrier drape, hand hygiene, sterile gown, sterile gloves, mask, hair covering, sterile ultrasound probe cover (if used).  Procedure Description Area of catheter insertion was cleaned with chlorhexidine and draped in sterile fashion.  With real-time ultrasound guidance a central venous catheter was placed into the left internal jugular vein. Nonpulsatile blood flow and easy flushing noted in all ports.  The catheter was sutured in place and sterile dressing applied.  Complications/Tolerance None; patient tolerated the procedure well. Chest X-ray is ordered to verify placement for internal jugular or subclavian cannulation.   Chest x-ray is not ordered for femoral cannulation.  EBL Minimal  Specimen(s) None  JD Anselm Lis [place] [facility] 10/12/2022, 11:25 PM  Please see [email or web address] for pager details.  From 7A-7P if no response, please call [phone]. After hours, please call [facility]Link [phone].

## 2022-10-12 NOTE — Progress Notes (Signed)
Patient has progressively become septic throughout the course of the day and is now febrile with temp 102F with tachypnea, tachycardia, and hypotension. Lactic acid is uptrending despite initiation of IVF and Rocephin. He will be transferred to ICU with norepinephrine ordered. Discussed with daughter, Evan Miller, who understands the plan and confirms full code status.   Fluid bolus ordered and antibiotics escalated to cefepime and vancomycin. RN instructed to place NG tube per general surgery recommendation. Will order albumin as well to assist with BP management. Dr. Mariea Clonts at bedside to evaluate.  Spoke with Dr. Tonia Brooms through [facility] who accepts to [facility] service.  Critical care time: 40 minutes.

## 2022-10-12 NOTE — Progress Notes (Signed)
PROGRESS NOTE    Evan Miller  ZOX:[id number] DOB: March 24, 1926 DOA: 10/27/2022 PCP: Elenore Paddy, NP   Brief Narrative:    Evan Miller is a 89 y.o. male with medical history significant of hypothyroidism, GERD, arthritis and history of Schatzki's ring who presented to the ED via EMS from SNF due to x-ray done at the facility that was concerning for ileus.  Patient was admitted with concerns of small bowel obstruction as well as ascites.  He has been seen by general surgery as well as GI with plans to start clear liquid diet and he has already had disimpaction and has been started on tapwater enemas and suppositories.  Given his leukocytosis there is concern for SBP and patient will require paracentesis for which she will need to transfer to [facility] for IR to perform this procedure.  Assessment & Plan:   Principal Problem:   Small bowel obstruction (HCC) Active Problems:   Acquired hypothyroidism   Leukocytosis   Thrombocytosis   Hyponatremia   Hypokalemia   Hypoalbuminemia due to protein-calorie malnutrition (HCC)   Hyperglycemia   Ascites  Assessment and Plan:   Possible small bowel obstruction Trial of clear liquid diet Continue IV hydration Continue Compazine p.r.n. for nausea/vomiting Appreciate general surgery recommendations and they will continue to follow at [facility]   Ascites with leukocytosis and possible concern for SBP CT abdomen and pelvis was suggestive of large volume ascites Transfer to [facility] for IR to perform paracentesis and labs ordered Please initiate Rocephin after paracentesis performed for empirical treatment GI to continue following   Leukocytosis/Thrombocytosis possibly due to hemoconcentration Likely secondary to above, continue to monitor   Hyponatremia Sodium 133, continue IV hydration   Hyperglycemia possibly reactive Improving, continue to monitor with CBG checks every 6 hours   Hypoalbuminemia possibly secondary to  moderate protein calorie malnutrition Albumin 2.5, Consider protein supplement when patient resumes oral intake   Acquired hypothyroidism Resume Synthroid  DVT prophylaxis:Heparin Code Status: Full Family Communication: Daughter at bedside 5/4 Disposition Plan:  Status is: Inpatient Remains inpatient appropriate because: Need for IV medications.   Consultants:  General surgery GI  Procedures:  None  Antimicrobials:  None   Subjective: Patient seen and evaluated today with no new acute complaints or concerns. No acute concerns or events noted overnight.  Objective: Vitals:   10/12/22 0000 10/12/22 0101 10/12/22 0340 10/12/22 0443  BP: (!) 95/59 (!) 83/56 (!) 107/58 (!) 91/58  Pulse: 100 100 99 97  Resp: (!) 24 18 18 18   Temp: 98.4 F (36.9 C) 97.7 F (36.5 C) 98 F (36.7 C) 98.3 F (36.8 C)  TempSrc: Oral Oral Oral Oral  SpO2: 95% 96% 98% 97%  Weight:  72.9 kg    Height:  5\' 3"  (1.6 m)      Intake/Output Summary (Last 24 hours) at 10/12/2022 0704 Last data filed at 10/12/2022 8119 Gross per 24 hour  Intake 1215.76 ml  Output 200 ml  Net 1015.76 ml   Filed Weights   10/21/2022 1831 10/12/22 0101  Weight: 78.5 kg 72.9 kg    Examination:  General exam: Appears calm and comfortable, pleasantly confused Respiratory system: Clear to auscultation. Respiratory effort normal. Cardiovascular system: S1 & S2 heard, RRR.  Gastrointestinal system: Abdomen is soft, minimally distended Central nervous system: Alert and awake Extremities: No edema Skin: No significant lesions noted Psychiatry: Flat affect.    Data Reviewed: I have personally reviewed following labs and imaging studies  CBC:  Recent Labs  Lab 11/07/2022 1849 10/12/22 0541  WBC 18.7* 19.2*  NEUTROABS 16.8*  --   HGB 9.7* 8.8*  HCT 29.8* 27.4*  MCV 83.0 84.0  PLT 474* 437*   Basic Metabolic Panel: Recent Labs  Lab 10/25/2022 1849  NA 132*  K 3.4*  CL 96*  CO2 25  GLUCOSE 169*  BUN 23   CREATININE 1.46*  CALCIUM 8.2*   GFR: Estimated Creatinine Clearance: 27.7 mL/min (A) (by C-G formula based on SCr of 1.46 mg/dL (H)). Liver Function Tests: Recent Labs  Lab 10/29/2022 1849  AST 39  ALT 29  ALKPHOS 219*  BILITOT 1.8*  PROT 6.3*  ALBUMIN 2.6*   No results for input(s): "LIPASE", "AMYLASE" in the last 168 hours. No results for input(s): "AMMONIA" in the last 168 hours. Coagulation Profile: No results for input(s): "INR", "PROTIME" in the last 168 hours. Cardiac Enzymes: No results for input(s): "CKTOTAL", "CKMB", "CKMBINDEX", "TROPONINI" in the last 168 hours. BNP (last 3 results) No results for input(s): "PROBNP" in the last 8760 hours. HbA1C: No results for input(s): "HGBA1C" in the last 72 hours. CBG: No results for input(s): "GLUCAP" in the last 168 hours. Lipid Profile: No results for input(s): "CHOL", "HDL", "LDLCALC", "TRIG", "CHOLHDL", "LDLDIRECT" in the last 72 hours. Thyroid Function Tests: No results for input(s): "TSH", "T4TOTAL", "FREET4", "T3FREE", "THYROIDAB" in the last 72 hours. Anemia Panel: No results for input(s): "VITAMINB12", "FOLATE", "FERRITIN", "TIBC", "IRON", "RETICCTPCT" in the last 72 hours. Sepsis Labs: No results for input(s): "PROCALCITON", "LATICACIDVEN" in the last 168 hours.  No results found for this or any previous visit (from the past 240 hour(s)).       Radiology Studies: CT ABDOMEN PELVIS W CONTRAST  Result Date: 10/22/2022 CLINICAL DATA:  Bowel obstruction suspected.  Prostate cancer. EXAM: CT ABDOMEN AND PELVIS WITH CONTRAST TECHNIQUE: Multidetector CT imaging of the abdomen and pelvis was performed using the standard protocol following bolus administration of intravenous contrast. RADIATION DOSE REDUCTION: This exam was performed according to the departmental dose-optimization program which includes automated exposure control, adjustment of the mA and/or kV according to patient size and/or use of iterative  reconstruction technique. CONTRAST:  80mL OMNIPAQUE IOHEXOL 300 MG/ML  SOLN COMPARISON:  None Available. FINDINGS: Lower chest: There is a trace left pleural effusion. Hepatobiliary: No focal liver abnormality is seen. No gallstones, gallbladder wall thickening, or biliary dilatation. Pancreas: Unremarkable. No pancreatic ductal dilatation or surrounding inflammatory changes. Spleen: Normal in size without focal abnormality. Calcified granulomas are present. Adrenals/Urinary Tract: There is mild bilateral hydroureteronephrosis without obstructing calculus identified. Bladder and adrenal glands are within normal limits. Stomach/Bowel: There are multiple dilated loops of small bowel in the central abdomen measuring up to 3.7 cm in diameter. No definite transition point visualized. The colon is nondilated. There is a large amount of stool in the rectum. The appendix is normal in size. Stomach is decompressed. A small hiatal hernia is present. Vascular/Lymphatic: Aorta and IVC are normal in size. No enlarged lymph nodes are identified. Reproductive: Prostate gland appears within normal limits. Other: There is large volume ascites throughout the abdomen and pelvis. There is some peritoneal wall enhancement in the pelvis and upper abdomen, indeterminate. There is no focal hernia. Musculoskeletal: Severe degenerative changes affect the spine. There are scattered rounded sclerotic lesions throughout the osseous structures worrisome for metastatic disease. IMPRESSION: 1. Multiple dilated loops of small bowel in the central abdomen worrisome for small bowel obstruction. No definite transition point visualized. 2. Large volume ascites.  There is peritoneal wall enhancement in the pelvis and upper abdomen, indeterminate. 3. Mild bilateral hydroureteronephrosis without obstructing calculus identified. 4. Trace left pleural effusion. 5. Sclerotic osseous lesions worrisome for metastatic disease. Electronically Signed   By: Darliss Cheney M.D.   On: 10/24/2022 21:03        Scheduled Meds:  heparin  5,000 Units Subcutaneous Q8H   Continuous Infusions:  lactated ringers 75 mL/hr at 10/12/22 0253     LOS: 1 day    Time spent: 35 minutes    Calix Heinbaugh Hoover Brunette, DO [facility]  If 7PM-7AM, please contact night-coverage [email or web address] 10/12/2022, 7:04 AM

## 2022-10-12 NOTE — Progress Notes (Signed)
   10/12/22 1850  Vitals  Temp (!) 101.3 F (38.5 C)  Temp Source Axillary  BP (!) 74/47  MAP (mmHg) (!) 54  BP Location Right Arm  BP Method Automatic  Patient Position (if appropriate) Lying  Pulse Rate (!) 115  Pulse Rate Source Monitor  ECG Heart Rate (!) 115  Resp (!) 33  Level of Consciousness  Level of Consciousness Alert  Oxygen Therapy  SpO2 97 %  O2 Device Room Air  Pain Assessment  Pain Scale 0-10  Pain Score 0  MEWS Score  MEWS Temp 1  MEWS Systolic 2  MEWS Pulse 2  MEWS RR 2  MEWS LOC 0  MEWS Score 7  MEWS Score Color Red

## 2022-10-12 NOTE — Progress Notes (Signed)
Pharmacy Antibiotic Note  Evan Miller is a 89 y.o. male admitted on 11/06/2022 with sepsis.  Patient with fever 102, tachypnea, tachycardia, and hypotension.  Pharmacy has been consulted for Vancomycin and Cefepime dosing.  SCr 1.32, CrCl ~ 30 ml/min  Plan: Vancomycin 1500mg  IV q48h (SCr 1.32, eAUC 457, Vd 0.72) Cefepime 2gm IV q24h   Height: 5\' 6"  (167.6 cm) Weight: 75.8 kg (167 lb 1.7 oz) IBW/kg (Calculated) : 63.8  Temp (24hrs), Avg:98.4 F (36.9 C), Min:97.6 F (36.4 C), Max:99.4 F (37.4 C)  Recent Labs  Lab 10/12/2022 1849 10/12/22 0541 10/12/22 1323 10/12/22 1554  WBC 18.7* 19.2*  --   --   CREATININE 1.46* 1.32*  --   --   LATICACIDVEN  --   --  2.3* 3.3*    Estimated Creatinine Clearance: 30.9 mL/min (A) (by C-G formula based on SCr of 1.32 mg/dL (H)).    Allergies  Allergen Reactions   Ciprofloxacin Hives    Hives at IV infusion site   Penicillins Other (See Comments)    Reaction: large knots on head Has patient had a PCN reaction causing immediate rash, facial/tongue/throat swelling, SOB or lightheadedness with hypotension: No Has patient had a PCN reaction causing severe rash involving mucus membranes or skin necrosis: No Has patient had a PCN reaction that required hospitalization No Has patient had a PCN reaction occurring within the last 10 years: No If all of the above answers are "NO", then may proceed with Cephalosporin use.    Sulfa Antibiotics Hives    Antimicrobials this admission: Vanc 5/4 >>  Cefepime 5/4 >>   Dose adjustments this admission:   Microbiology results: 5/4 BCx:   Thank you for allowing pharmacy to be a part of this patient's care.  Brandon Melnick 10/12/2022 6:47 PM

## 2022-10-12 NOTE — Progress Notes (Signed)
eLink Physician-Brief Progress Note Patient Name: Evan Miller DOB: April 20, 1926 MRN: [id number]   Date of Service  10/12/2022  HPI/Events of Note  89 yr old male admitted in transfer for treatment of sepsis from presumed abd source.  He is intubated on low dose vasopressor support.  Presently he is full code.  eICU Interventions  Chart reviewed     Intervention Category Evaluation Type: New Patient Evaluation  Henry Russel, P 10/12/2022, 11:22 PM

## 2022-10-12 NOTE — Progress Notes (Signed)
   10/12/22 1047  Assess: MEWS Score  Temp 98.4 F (36.9 C)  BP 95/63  MAP (mmHg) 74  Pulse Rate 92  Resp (!) 26  SpO2 98 %  O2 Device Room Air  Assess: MEWS Score  MEWS Temp 0  MEWS Systolic 1  MEWS Pulse 0  MEWS RR 2  MEWS LOC 0  MEWS Score 3  MEWS Score Color Yellow  Assess: if the MEWS score is Yellow or Red  Were vital signs taken at a resting state? Yes  Focused Assessment Change from prior assessment (see assessment flowsheet)  Does the patient meet 2 or more of the SIRS criteria? Yes  Does the patient have a confirmed or suspected source of infection? No  MEWS guidelines implemented  Yes, yellow  Treat  MEWS Interventions Considered administering scheduled or prn medications/treatments as ordered  Take Vital Signs  Increase Vital Sign Frequency  Yellow: Q2hr x1, continue Q4hrs until patient remains green for 12hrs  Escalate  MEWS: Escalate Yellow: Discuss with charge nurse and consider notifying provider and/or RRT  Notify: Charge Nurse/RN  Name of Charge Nurse/RN Notified [place] RN  Provider Notification  Provider Name/Title Maurilio Lovely D, DO  Date Provider Notified 10/12/22  Time Provider Notified 1048  Method of Notification Page (Secure chat)  Notification Reason Other (Comment) (yellow MEWs)  Provider response See new orders  Date of Provider Response 10/12/22  Time of Provider Response 1048  Assess: SIRS CRITERIA  SIRS Temperature  0  SIRS Pulse 1  SIRS Respirations  1  SIRS WBC 1  SIRS Score Sum  3

## 2022-10-12 NOTE — Progress Notes (Signed)
   10/12/22 1257  Assess: MEWS Score  Temp 99.4 F (37.4 C)  BP (!) 95/57  MAP (mmHg) 70  Pulse Rate (!) 105  Resp (!) 28  SpO2 98 %  O2 Device Room Air  Assess: MEWS Score  MEWS Temp 0  MEWS Systolic 1  MEWS Pulse 1  MEWS RR 2  MEWS LOC 0  MEWS Score 4  MEWS Score Color Red  Assess: if the MEWS score is Yellow or Red  Were vital signs taken at a resting state? Yes  Focused Assessment Change from prior assessment (see assessment flowsheet)  Does the patient meet 2 or more of the SIRS criteria? Yes  Does the patient have a confirmed or suspected source of infection? Yes  MEWS guidelines implemented  Yes, red  Treat  MEWS Interventions Considered administering scheduled or prn medications/treatments as ordered  Take Vital Signs  Increase Vital Sign Frequency  Red: Q1hr x2, continue Q4hrs until patient remains green for 12hrs  Escalate  MEWS: Escalate Red: Discuss with charge nurse and notify provider. Consider notifying RRT. If remains red for 2 hours consider need for higher level of care  Notify: Charge Nurse/RN  Name of Charge Nurse/RN Notified [place] RN  Provider Notification  Provider Name/Title Maurilio Lovely D, DO  Date Provider Notified 10/12/22  Time Provider Notified 1258  Method of Notification Page (Secure chat)  Notification Reason Other (Comment) (Red MEWs)  Provider response See new orders  Date of Provider Response 10/12/22  Time of Provider Response 1300  Assess: SIRS CRITERIA  SIRS Temperature  0  SIRS Pulse 1  SIRS Respirations  1  SIRS WBC 1  SIRS Score Sum  3

## 2022-10-12 NOTE — Progress Notes (Signed)
Patient started pulling of grown refusing enema, patient unable to tolerate tap water enema at this time. Patient given Dulcolax suppository as ordered. MD Sherryll Burger and Pappayliou made aware. Per MD Pappayliou okay to hold off on enema at this time. Patients vital signs: T-99.4, BP-95/57, R-28, HR-105, O2-98% at room air. Patient red MEWs. Patient verbalizing no complaints of shortness of breath. MD Sherryll Burger made aware. New orders placed.

## 2022-10-12 NOTE — Progress Notes (Signed)
Placed on 4 liters oxygen for increased wob.

## 2022-10-12 NOTE — Progress Notes (Signed)
Cancelled iv consult for vasopressor iv.  MD placing central line.

## 2022-10-12 NOTE — Progress Notes (Signed)
Patients daughter at bedside. Patient complaining of shortness of breath, noted expiratory wheeze. Vital signs T-98.4, P-92, R-26, BP-95/63 map 74, O2-98% at RA. Patient daughter requested to speak with MD. MD Sherryll Burger made aware. New orders placed. RT called to give patient a neb treatment.

## 2022-10-12 NOTE — Progress Notes (Addendum)
Lab called critical lab Lactic acid 2.3. Md Sherryll Burger made aware. New orders placed.

## 2022-10-12 NOTE — Plan of Care (Signed)

## 2022-10-12 NOTE — Consult Note (Signed)
[facility] Consult  Reason for Consult: Concern for small bowel obstruction Referring Physician: Bobbye Riggs, PA-C  Chief Complaint   GI Problem     HPI: Evan Miller is a 89 y.o. male who presented to the hospital for feeling unwell for 2 weeks with decreased appetite from SNF.  Patient is a poor historian, so most of history was obtained from EMR.  Patient does not know the last time he had a bowel movement or passed gas.  He was tolerating liquids at home, but noted increased abdominal distention recently.  He denies any abdominal pain.  His past medical history is significant for prostate cancer which was not treated secondary to his age, skin cancers (basal cell carcinoma and melanoma), hypothyroidism, GERD, and memory loss.  He is unsure if he has had any surgeries on his abdomen, but I do not see any scars on examination.  In the ED, he was hemodynamically stable and afebrile.  He was noted to have a leukocytosis of 18.7.  He had an elevated total bilirubin of 1.8 and alk phos of 219.  He underwent a CT abdomen and pelvis which demonstrated large volume ascites with enhancement of the peritoneal wall in the pelvis and upper abdomen, multiple dilated loops of small bowel without discrete transition point, and sclerotic osseous lesions worrisome for metastatic disease.  Gallbladder was unremarkable on CT imaging with no evidence of cholelithiasis.  Past Medical History:  Diagnosis Date   Arthritis    back,shoulders and hips   Cancer (HCC)    basal cell/ Melanoma 1997 left  shoulder/  PROSTATE   GERD (gastroesophageal reflux disease)    Hepatitis    age 22 years   Hypothyroidism    Memory loss    PONV (postoperative nausea and vomiting)    Prostate CA [facility]     Past Surgical History:  Procedure Laterality Date   BIOPSY  09/08/2020   Procedure: BIOPSY;  Surgeon: Dolores Frame, MD;  [facility];  Service: Gastroenterology;;   BIOPSY   03/20/2021   Procedure: BIOPSY;  Surgeon: Dolores Frame, MD;  [facility];  Service: Gastroenterology;;   CATARACT EXTRACTION W/PHACO  12/16/2011   Procedure: CATARACT EXTRACTION PHACO AND INTRAOCULAR LENS PLACEMENT (IOC);  Surgeon: Gemma Payor, MD;  [facility]: [facility];  Service: Ophthalmology;  Laterality: Left;  CDE:17.24    CATARACT EXTRACTION W/PHACO  12/30/2011   Procedure: CATARACT EXTRACTION PHACO AND INTRAOCULAR LENS PLACEMENT (IOC);  Surgeon: Gemma Payor, MD;  Location: [facility];  Service: Ophthalmology;  Laterality: Right;  CDE 15.40   COLONOSCOPY     COLONOSCOPY N/A 08/07/2012   Procedure: COLONOSCOPY;  Surgeon: Malissa Hippo, MD;  [facility];  Service: Endoscopy;  Laterality: N/A;  10:00   COLONOSCOPY N/A 11/22/2015   Procedure: COLONOSCOPY;  Surgeon: Malissa Hippo, MD;  [facility];  Service: Endoscopy;  Laterality: N/A;  12:00 - moved to 10:30 - Ann to notify   ESOPHAGEAL DILATION N/A 02/10/2015   Procedure: ESOPHAGEAL DILATION;  Surgeon: Malissa Hippo, MD;  [facility];  Service: Endoscopy;  Laterality: N/A;   ESOPHAGEAL DILATION N/A 03/29/2015   Procedure: ESOPHAGEAL DILATION;  Surgeon: Malissa Hippo, MD;  [facility];  Service: Endoscopy;  Laterality: N/A;   ESOPHAGEAL DILATION  11/22/2015   Procedure: ESOPHAGEAL DILATION;  Surgeon: Malissa Hippo, MD;  [facility];  Service: Endoscopy;;   ESOPHAGEAL DILATION N/A 11/19/2017  Procedure: ESOPHAGEAL DILATION;  Surgeon: Malissa Hippo, MD;  [facility];  Service: Endoscopy;  Laterality: N/A;   ESOPHAGEAL DILATION N/A 12/29/2017   Procedure: ESOPHAGEAL DILATION;  Surgeon: Malissa Hippo, MD;  [facility];  Service: Endoscopy;  Laterality: N/A;   ESOPHAGEAL DILATION N/A 09/08/2020   Procedure: ESOPHAGEAL DILATION;  Surgeon: Dolores Frame, MD;  [facility];  Service: Gastroenterology;  Laterality: N/A;    ESOPHAGEAL DILATION N/A 03/20/2021   Procedure: ESOPHAGEAL DILATION;  Surgeon: Dolores Frame, MD;  [facility];  Service: Gastroenterology;  Laterality: N/A;   ESOPHAGOGASTRODUODENOSCOPY N/A 02/10/2015   Procedure: ESOPHAGOGASTRODUODENOSCOPY (EGD);  Surgeon: Malissa Hippo, MD;  [facility];  Service: Endoscopy;  Laterality: N/A;  925 - moved to 10:20 - Ann to notify pt   ESOPHAGOGASTRODUODENOSCOPY N/A 03/29/2015   Procedure: ESOPHAGOGASTRODUODENOSCOPY (EGD);  Surgeon: Malissa Hippo, MD;  [facility];  Service: Endoscopy;  Laterality: N/A;  145 - moved to 8:30 - Ann notified pt   ESOPHAGOGASTRODUODENOSCOPY N/A 11/22/2015   Procedure: ESOPHAGOGASTRODUODENOSCOPY (EGD);  Surgeon: Malissa Hippo, MD;  [facility];  Service: Endoscopy;  Laterality: N/A;   ESOPHAGOGASTRODUODENOSCOPY N/A 10/27/2017   Procedure: ESOPHAGOGASTRODUODENOSCOPY (EGD);  Surgeon: Corbin Ade, MD;  [facility];  Service: Endoscopy;  Laterality: N/A;  removal of food impaction   ESOPHAGOGASTRODUODENOSCOPY N/A 11/19/2017   Procedure: ESOPHAGOGASTRODUODENOSCOPY (EGD);  Surgeon: Malissa Hippo, MD;  [facility];  Service: Endoscopy;  Laterality: N/A;  7:30   ESOPHAGOGASTRODUODENOSCOPY N/A 12/29/2017   Procedure: ESOPHAGOGASTRODUODENOSCOPY (EGD);  Surgeon: Malissa Hippo, MD;  [facility];  Service: Endoscopy;  Laterality: N/A;  730   ESOPHAGOGASTRODUODENOSCOPY (EGD) WITH PROPOFOL N/A 09/08/2020   Procedure: ESOPHAGOGASTRODUODENOSCOPY (EGD) WITH PROPOFOL;  Surgeon: Dolores Frame, MD;  [facility];  Service: Gastroenterology;  Laterality: N/A;  am   ESOPHAGOGASTRODUODENOSCOPY (EGD) WITH PROPOFOL N/A 02/06/2021   Procedure: ESOPHAGOGASTRODUODENOSCOPY (EGD) WITH PROPOFOL;  Surgeon: Dolores Frame, MD;  [facility];  Service: Gastroenterology;  Laterality: N/A;   ESOPHAGOGASTRODUODENOSCOPY (EGD)  WITH PROPOFOL N/A 03/20/2021   Procedure: ESOPHAGOGASTRODUODENOSCOPY (EGD) WITH PROPOFOL;  Surgeon: Dolores Frame, MD;  [facility];  Service: Gastroenterology;  Laterality: N/A;  10:55   HERNIA REPAIR     HYDROCELE EXCISION     bilateral   JOINT REPLACEMENT     left knee 9/12   MELANOMA EXCISION N/A 06/11/2017   Procedure: WIDE LOCAL EXCISION WITH ADVANCEMENT FLAP CLOSURE LEFT NECK MELANOMA;  Surgeon: Almond Lint, MD;  Location: [facility];  Service: General;  Laterality: N/A;  GENERAL AND LOCAL   TOTAL KNEE ARTHROPLASTY  06/24/2011   Procedure: TOTAL KNEE ARTHROPLASTY;  Surgeon: Loanne Drilling;  Location: [facility];  Service: Orthopedics;  Laterality: Right;    Family History  Problem Relation Age of Onset   Breast cancer Mother    Prostate cancer Brother    Nephritis Brother    Rectal cancer Brother    Cirrhosis Brother    Liver cancer Brother    Healthy Daughter    Healthy Son     Social History   Tobacco Use   Smoking status: Former    Packs/day: 1.00    Years: 30.00    Additional pack years: 0.00    Total pack years: 30.00    Types: Cigarettes    Quit date: 06/16/1962    Years  since quitting: 60.3   Smokeless tobacco: Former    Types: Chew    Quit date: 06/16/2006  Vaping Use   Vaping Use: Never used  Substance Use Topics   Alcohol use: No    Comment: quit  60 yrs ago   Drug use: No    Medications: I have reviewed the patient's current medications. Prior to Admission:  Medications Prior to Admission  Medication Sig Dispense Refill Last Dose   amoxicillin-clavulanate (AUGMENTIN) 875-125 MG tablet Take 1 tablet every 12 hours by oral route for 5 days.      benzonatate (TESSALON) 100 MG capsule Take 1 capsule (100 mg total) by mouth 3 (three) times daily as needed. 30 capsule 0    NP THYROID 120 MG tablet TAKE 1 TABLET(120 MG) BY MOUTH DAILY (Patient taking differently: Take 120 mg by mouth daily at 6 (six) AM.) 30 tablet 3     omeprazole (PRILOSEC) 40 MG capsule Take 1 capsule (40 mg total) by mouth 2 (two) times daily. 60 capsule 3    triamcinolone (KENALOG) 0.1 % Apply 1 application topically 2 (two) times daily. (Patient not taking: No sig reported) 30 g 0    Scheduled:  heparin  5,000 Units Subcutaneous Q8H   Continuous:  lactated ringers 75 mL/hr at 10/12/22 0253   WUJ:WJXBJ rinse, prochlorperazine  Allergies  Allergen Reactions   Ciprofloxacin Hives    Hives at IV infusion site   Penicillins Other (See Comments)    Reaction: large knots on head Has patient had a PCN reaction causing immediate rash, facial/tongue/throat swelling, SOB or lightheadedness with hypotension: No Has patient had a PCN reaction causing severe rash involving mucus membranes or skin necrosis: No Has patient had a PCN reaction that required hospitalization No Has patient had a PCN reaction occurring within the last 10 years: No If all of the above answers are "NO", then may proceed with Cephalosporin use.    Sulfa Antibiotics Hives     ROS:  Pertinent items are noted in HPI.  Blood pressure (!) 90/58, pulse 95, temperature 97.6 F (36.4 C), temperature source Oral, resp. rate 18, height 5\' 3"  (1.6 m), weight 72.9 kg, SpO2 97 %. Physical Exam Vitals reviewed.  Constitutional:      General: He is not in acute distress.    Appearance: Normal appearance.  HENT:     Head: Normocephalic and atraumatic.  Eyes:     Extraocular Movements: Extraocular movements intact.     Pupils: Pupils are equal, round, and reactive to light.  Cardiovascular:     Rate and Rhythm: Normal rate.  Pulmonary:     Effort: Pulmonary effort is normal.  Abdominal:     Comments: Abdomen soft, moderate distention, no percussion tenderness, nontender to palpation; no rigidity, guarding, rebound tenderness; negative Murphy sign  Musculoskeletal:     Cervical back: Normal range of motion.  Skin:    General: Skin is warm and dry.  Neurological:      General: No focal deficit present.     Mental Status: He is alert. Mental status is at baseline.  Psychiatric:        Mood and Affect: Mood normal.        Behavior: Behavior normal.     Results: Results for orders placed or performed during the hospital encounter of 10-19-22 (from the past 48 hour(s))  Comprehensive metabolic panel     Status: Abnormal   Collection Time: 2022-10-19  6:49 PM  Result Value Ref Range  Sodium 132 (L) 135 - 145 mmol/L   Potassium 3.4 (L) 3.5 - 5.1 mmol/L   Chloride 96 (L) 98 - 111 mmol/L   CO2 25 22 - 32 mmol/L   Glucose, Bld 169 (H) 70 - 99 mg/dL    Comment: Glucose reference range applies only to samples taken after fasting for at least 8 hours.   BUN 23 8 - 23 mg/dL   Creatinine, Ser 1.61 (H) 0.61 - 1.24 mg/dL   Calcium 8.2 (L) 8.9 - 10.3 mg/dL   Total Protein 6.3 (L) 6.5 - 8.1 g/dL   Albumin 2.6 (L) 3.5 - 5.0 g/dL   AST 39 15 - 41 U/L   ALT 29 0 - 44 U/L   Alkaline Phosphatase 219 (H) 38 - 126 U/L   Total Bilirubin 1.8 (H) 0.3 - 1.2 mg/dL   GFR, Estimated 44 (L) >60 mL/min    Comment: (NOTE) Calculated using the CKD-EPI Creatinine Equation (2021)    Anion gap 11 5 - 15    Comment: Performed at [facility], [address], [place], [place] [address]  CBC with Differential     Status: Abnormal   Collection Time: 10/15/2022  6:49 PM  Result Value Ref Range   WBC 18.7 (H) 4.0 - 10.5 K/uL   RBC 3.59 (L) 4.22 - 5.81 MIL/uL   Hemoglobin 9.7 (L) 13.0 - 17.0 g/dL   HCT 54.0 (L) 98.1 - 19.1 %   MCV 83.0 80.0 - 100.0 fL   MCH 27.0 26.0 - 34.0 pg   MCHC 32.6 30.0 - 36.0 g/dL   RDW 47.8 29.5 - 62.1 %   Platelets 474 (H) 150 - 400 K/uL   nRBC 0.0 0.0 - 0.2 %   Neutrophils Relative % 90 %   Neutro Abs 16.8 (H) 1.7 - 7.7 K/uL   Lymphocytes Relative 3 %   Lymphs Abs 0.5 (L) 0.7 - 4.0 K/uL   Monocytes Relative 6 %   Monocytes Absolute 1.2 (H) 0.1 - 1.0 K/uL   Eosinophils Relative 0 %   Eosinophils Absolute 0.0 0.0 - 0.5 K/uL   Basophils Relative 0 %    Basophils Absolute 0.0 0.0 - 0.1 K/uL   Immature Granulocytes 1 %   Abs Immature Granulocytes 0.13 (H) 0.00 - 0.07 K/uL    Comment: Performed at [facility], [address] [place], [place] [address]  Comprehensive metabolic panel     Status: Abnormal   Collection Time: 10/12/22  5:41 AM  Result Value Ref Range   Sodium 133 (L) 135 - 145 mmol/L   Potassium 3.5 3.5 - 5.1 mmol/L   Chloride 100 98 - 111 mmol/L   CO2 23 22 - 32 mmol/L   Glucose, Bld 109 (H) 70 - 99 mg/dL    Comment: Glucose reference range applies only to samples taken after fasting for at least 8 hours.   BUN 20 8 - 23 mg/dL   Creatinine, Ser 7.84 (H) 0.61 - 1.24 mg/dL   Calcium 8.0 (L) 8.9 - 10.3 mg/dL   Total Protein 5.7 (L) 6.5 - 8.1 g/dL   Albumin 2.3 (L) 3.5 - 5.0 g/dL   AST 39 15 - 41 U/L   ALT 25 0 - 44 U/L   Alkaline Phosphatase 199 (H) 38 - 126 U/L   Total Bilirubin 2.2 (H) 0.3 - 1.2 mg/dL   GFR, Estimated 50 (L) >60 mL/min    Comment: (NOTE) Calculated using the CKD-EPI Creatinine Equation (2021)    Anion gap  10 5 - 15    Comment: Performed at [facility], [address], [place], [place] [address]  CBC     Status: Abnormal   Collection Time: 10/12/22  5:41 AM  Result Value Ref Range   WBC 19.2 (H) 4.0 - 10.5 K/uL   RBC 3.26 (L) 4.22 - 5.81 MIL/uL   Hemoglobin 8.8 (L) 13.0 - 17.0 g/dL   HCT 60.4 (L) 54.0 - 98.1 %   MCV 84.0 80.0 - 100.0 fL   MCH 27.0 26.0 - 34.0 pg   MCHC 32.1 30.0 - 36.0 g/dL   RDW 19.1 47.8 - 29.5 %   Platelets 437 (H) 150 - 400 K/uL   nRBC 0.0 0.0 - 0.2 %    Comment: Performed at [facility], [address], [place], [place] [address]  Magnesium     Status: None   Collection Time: 10/12/22  5:41 AM  Result Value Ref Range   Magnesium 2.1 1.7 - 2.4 mg/dL    Comment: Performed at [facility], [address] [place], [place] [address]  Phosphorus     Status: None   Collection Time: 10/12/22  5:41 AM  Result Value Ref Range   Phosphorus 3.3 2.5 - 4.6 mg/dL    Comment:  Performed at [facility], [address] [place], [place] [address]    CT ABDOMEN PELVIS W CONTRAST  Result Date: Oct 13, 2022 CLINICAL DATA:  Bowel obstruction suspected.  Prostate cancer. EXAM: CT ABDOMEN AND PELVIS WITH CONTRAST TECHNIQUE: Multidetector CT imaging of the abdomen and pelvis was performed using the standard protocol following bolus administration of intravenous contrast. RADIATION DOSE REDUCTION: This exam was performed according to the departmental dose-optimization program which includes automated exposure control, adjustment of the mA and/or kV according to patient size and/or use of iterative reconstruction technique. CONTRAST:  80mL OMNIPAQUE IOHEXOL 300 MG/ML  SOLN COMPARISON:  None Available. FINDINGS: Lower chest: There is a trace left pleural effusion. Hepatobiliary: No focal liver abnormality is seen. No gallstones, gallbladder wall thickening, or biliary dilatation. Pancreas: Unremarkable. No pancreatic ductal dilatation or surrounding inflammatory changes. Spleen: Normal in size without focal abnormality. Calcified granulomas are present. Adrenals/Urinary Tract: There is mild bilateral hydroureteronephrosis without obstructing calculus identified. Bladder and adrenal glands are within normal limits. Stomach/Bowel: There are multiple dilated loops of small bowel in the central abdomen measuring up to 3.7 cm in diameter. No definite transition point visualized. The colon is nondilated. There is a large amount of stool in the rectum. The appendix is normal in size. Stomach is decompressed. A small hiatal hernia is present. Vascular/Lymphatic: Aorta and IVC are normal in size. No enlarged lymph nodes are identified. Reproductive: Prostate gland appears within normal limits. Other: There is large volume ascites throughout the abdomen and pelvis. There is some peritoneal wall enhancement in the pelvis and upper abdomen, indeterminate. There is no focal hernia. Musculoskeletal: Severe  degenerative changes affect the spine. There are scattered rounded sclerotic lesions throughout the osseous structures worrisome for metastatic disease. IMPRESSION: 1. Multiple dilated loops of small bowel in the central abdomen worrisome for small bowel obstruction. No definite transition point visualized. 2. Large volume ascites. There is peritoneal wall enhancement in the pelvis and upper abdomen, indeterminate. 3. Mild bilateral hydroureteronephrosis without obstructing calculus identified. 4. Trace left pleural effusion. 5. Sclerotic osseous lesions worrisome for metastatic disease. Electronically Signed   By: Darliss Cheney M.D.   On: 10/12/2022 21:03     Assessment & Plan:  Jana Half  is a 89 y.o. male who was admitted with concern for possible small bowel obstruction.  Imaging and blood work evaluated by myself.  -On my review of imaging, patient has dilated loops of small bowel, but I do not appreciate a transition point.  Patient has no nausea or vomiting and there is no gastric distention on CT imaging.   -No need for NG tube placement at this time.   -Need to monitor for bowel function, as patient is poor historian and unable to recall the last time he had a bowel movement or passed flatus.   -With no history of abdominal surgeries, small bowel obstruction from adhesive disease is significantly less likely -Okay to trial clear liquid diet at this time to see how patient progresses -Tapwater enema and Dulcolax suppository ordered -I am concerned given his increasing leukocytosis and newly diagnosed large volume ascites in his abdomen that he may have spontaneous bacterial peritonitis -Paracentesis has been ordered with plan to obtain cytology and culture of fluid -With sclerotic osseous lesions and history of prostate cancer that was not treated because of his age, it is possible that this fluid could also be malignant ascites.  Will await results of paracentesis -Patient also had  increase in alkaline phosphatase and total bilirubin.  Gallbladder appears unremarkable and CT with no gallstones.  Exam does not suggest acute cholecystitis with negative Murphy sign.  If patient begins to have symptoms more consistent with acute cholecystitis, could consider abdominal ultrasound.  With large volume ascites, patient would likely not be a candidate for cholecystectomy and would require percutaneous cholecystostomy tube -Appreciate GI recommendations -Care per hospitalist  All questions were answered to the satisfaction of the patient.  -- Theophilus Kinds, DO [facility] [address] [place], [place] [address] [phone] (office)

## 2022-10-12 NOTE — Progress Notes (Signed)
Evan Miller is to patients room to look at IV due to IV leaking, nurse techs at bedside. Patient lying flat only responding to pain. Charge nurse Evan Miller called to patients room. Head of bed elevated, patients vital signs T-102.9, P-124, R-34, BP-85/58, O2-98% at room air. CBG 114. Rapid response called at 1758. Tim AC at bedside. Patient more responsive and alert once head of bed elevated. MD Sherryll Burger made aware. New orders placed. Patient transferred to ICU.

## 2022-10-12 NOTE — Procedures (Signed)
Intubation Procedure Note  Evan Miller  [id number]  10/03/1925  Date:10/12/22  Time:11:26 PM   Provider Performing:Philena Obey D Suzie Portela    Procedure: Intubation (31500)  Indication(s) Respiratory Failure  Consent Risks of the procedure as well as the alternatives and risks of each were explained to the patient and/or caregiver.  Consent for the procedure was obtained and is signed in the bedside chart   Anesthesia Etomidate, Versed, Fentanyl, and Rocuronium   Time Out Verified patient identification, verified procedure, site/side was marked, verified correct patient position, special equipment/implants available, medications/allergies/relevant history reviewed, required imaging and test results available.   Sterile Technique Usual hand hygeine, masks, and gloves were used   Procedure Description Patient positioned in bed supine.  Sedation given as noted above.  Patient was intubated with endotracheal tube using Glidescope.  View was Grade 1 full glottis .  Number of attempts was 1.  Colorimetric CO2 detector was consistent with tracheal placement.   Complications/Tolerance None; patient tolerated the procedure well. Chest X-ray is ordered to verify placement.   EBL Minimal   Specimen(s) None  JD Anselm [facility] 10/12/2022, 11:28 PM  Please see [email or web address] for pager details.  From 7A-7P if no response, please call [phone]. After hours, please call ELink [phone].

## 2022-10-12 NOTE — TOC Progression Note (Signed)
Transition of Care [facility]) - Progression Note    Patient Details  Name: Evan Miller MRN: [id number] Date of Birth: [birth year]  Transition of Care [facility]) CM/SW Contact  Catalina Gravel, LCSW Phone Number: 10/12/2022, 1:25 PM  Clinical Narrative:     .Marland Kitchen Transition of Care [facility]) Screening Note   Patient Details  Name: Evan Miller Date of Birth: 1925/10/10   Transition of Care (TOC) CM/SW Contact:    Catalina Gravel, LCSW Phone Number: 10/12/2022, 1:25 PM    Transition of Care Department [facility]) has reviewed patient and no TOC needs have been identified at this time. We will continue to monitor patient advancement through interdisciplinary progression rounds. If new patient transition needs arise, please place a TOC consult.      Barriers to Discharge: Continued Medical Work up  Expected Discharge Plan and Services                                               Social Determinants of Health (SDOH) Interventions SDOH Screenings   Food Insecurity: No Food Insecurity (10/09/2020)  Housing: Low Risk  (10/09/2020)  Transportation Needs: No Transportation Needs (10/09/2020)  Alcohol Screen: Low Risk  (10/09/2020)  Depression (PHQ2-9): Low Risk  (10/09/2020)  Financial Resource Strain: Low Risk  (10/09/2020)  Physical Activity: Inactive (10/09/2020)  Social Connections: Moderately Isolated (10/09/2020)  Stress: No Stress Concern Present (10/09/2020)  Tobacco Use: Medium Risk (11/07/2022)    Readmission Risk Interventions     No data to display

## 2022-10-13 DIAGNOSIS — R188 Other ascites: Secondary | ICD-10-CM | POA: Diagnosis not present

## 2022-10-13 DIAGNOSIS — R6521 Severe sepsis with septic shock: Secondary | ICD-10-CM | POA: Diagnosis not present

## 2022-10-13 DIAGNOSIS — A419 Sepsis, unspecified organism: Secondary | ICD-10-CM | POA: Diagnosis not present

## 2022-10-13 DIAGNOSIS — Z7189 Other specified counseling: Secondary | ICD-10-CM

## 2022-10-13 DIAGNOSIS — E872 Acidosis, unspecified: Secondary | ICD-10-CM

## 2022-10-13 DIAGNOSIS — K56609 Unspecified intestinal obstruction, unspecified as to partial versus complete obstruction: Secondary | ICD-10-CM | POA: Diagnosis not present

## 2022-10-13 DIAGNOSIS — G9340 Encephalopathy, unspecified: Secondary | ICD-10-CM

## 2022-10-13 LAB — POCT I-STAT 7, (LYTES, BLD GAS, ICA,H+H)
Acid-base deficit: 13 mmol/L — ABNORMAL HIGH (ref 0.0–2.0)
Acid-base deficit: 23 mmol/L — ABNORMAL HIGH (ref 0.0–2.0)
Bicarbonate: 14.7 mmol/L — ABNORMAL LOW (ref 20.0–28.0)
Bicarbonate: 7.2 mmol/L — ABNORMAL LOW (ref 20.0–28.0)
Calcium, Ion: 1.15 mmol/L (ref 1.15–1.40)
Calcium, Ion: 1.11 mmol/L — ABNORMAL LOW (ref 1.15–1.40)
HCT: 29 % — ABNORMAL LOW (ref 39.0–52.0)
HCT: 29 % — ABNORMAL LOW (ref 39.0–52.0)
Hemoglobin: 9.9 g/dL — ABNORMAL LOW (ref 13.0–17.0)
Hemoglobin: 9.9 g/dL — ABNORMAL LOW (ref 13.0–17.0)
O2 Saturation: 100 %
O2 Saturation: 99 %
Patient temperature: 98.3
Patient temperature: 99.1
Potassium: 4 mmol/L (ref 3.5–5.1)
Potassium: 4.6 mmol/L (ref 3.5–5.1)
Sodium: 135 mmol/L (ref 135–145)
Sodium: 132 mmol/L — ABNORMAL LOW (ref 135–145)
TCO2: 16 mmol/L — ABNORMAL LOW (ref 22–32)
TCO2: 8 mmol/L — ABNORMAL LOW (ref 22–32)
pCO2 arterial: 40.2 mmHg (ref 32–48)
pCO2 arterial: 31.2 mmHg — ABNORMAL LOW (ref 32–48)
pH, Arterial: 7.171 — CL (ref 7.35–7.45)
pH, Arterial: 6.973 — CL (ref 7.35–7.45)
pO2, Arterial: 311 mmHg — ABNORMAL HIGH (ref 83–108)
pO2, Arterial: 195 mmHg — ABNORMAL HIGH (ref 83–108)

## 2022-10-13 LAB — BLOOD CULTURE ID PANEL (REFLEXED) - BCID2

## 2022-10-13 LAB — COMPREHENSIVE METABOLIC PANEL
ALT: 326 U/L — ABNORMAL HIGH (ref 0–44)
AST: 798 U/L — ABNORMAL HIGH (ref 15–41)
Albumin: 2.7 g/dL — ABNORMAL LOW (ref 3.5–5.0)
Alkaline Phosphatase: 287 U/L — ABNORMAL HIGH (ref 38–126)
Anion gap: 22 — ABNORMAL HIGH (ref 5–15)
BUN: 25 mg/dL — ABNORMAL HIGH (ref 8–23)
CO2: 12 mmol/L — ABNORMAL LOW (ref 22–32)
Calcium: 8.2 mg/dL — ABNORMAL LOW (ref 8.9–10.3)
Chloride: 101 mmol/L (ref 98–111)
Creatinine, Ser: 2.4 mg/dL — ABNORMAL HIGH (ref 0.61–1.24)
GFR, Estimated: 24 mL/min — ABNORMAL LOW (ref >60)
Glucose, Bld: 94 mg/dL (ref 70–99)
Potassium: 4.5 mmol/L (ref 3.5–5.1)
Sodium: 135 mmol/L (ref 135–145)
Total Bilirubin: 3.6 mg/dL — ABNORMAL HIGH (ref 0.3–1.2)
Total Protein: 6.2 g/dL — ABNORMAL LOW (ref 6.5–8.1)

## 2022-10-13 LAB — LACTATE DEHYDROGENASE, PLEURAL OR PERITONEAL FLUID: LD, Fluid: 220 U/L — ABNORMAL HIGH (ref 3–23)

## 2022-10-13 LAB — CULTURE, BLOOD (ROUTINE X 2)

## 2022-10-13 LAB — GLUCOSE, CAPILLARY
Glucose-Capillary: 109 mg/dL — ABNORMAL HIGH (ref 70–99)
Glucose-Capillary: 109 mg/dL — ABNORMAL HIGH (ref 70–99)
Glucose-Capillary: 56 mg/dL — ABNORMAL LOW (ref 70–99)
Glucose-Capillary: 57 mg/dL — ABNORMAL LOW (ref 70–99)
Glucose-Capillary: 89 mg/dL (ref 70–99)
Glucose-Capillary: 92 mg/dL (ref 70–99)

## 2022-10-13 LAB — BODY FLUID CELL COUNT WITH DIFFERENTIAL
Eos, Fluid: 0 %
Lymphs, Fluid: 3 %
Monocyte-Macrophage-Serous Fluid: 2 % — ABNORMAL LOW (ref 50–90)
Neutrophil Count, Fluid: 95 % — ABNORMAL HIGH (ref 0–25)
Total Nucleated Cell Count, Fluid: 7400 cu mm — ABNORMAL HIGH (ref 0–1000)

## 2022-10-13 LAB — TSH: TSH: 7.956 u[IU]/mL — ABNORMAL HIGH (ref 0.350–4.500)

## 2022-10-13 LAB — PHOSPHORUS: Phosphorus: 6.6 mg/dL — ABNORMAL HIGH (ref 2.5–4.6)

## 2022-10-13 LAB — CBC
HCT: 29.8 % — ABNORMAL LOW (ref 39.0–52.0)
Hemoglobin: 9.1 g/dL — ABNORMAL LOW (ref 13.0–17.0)
MCH: 27.6 pg (ref 26.0–34.0)
MCHC: 30.5 g/dL (ref 30.0–36.0)
MCV: 90.3 fL (ref 80.0–100.0)
Platelets: 422 10*3/uL — ABNORMAL HIGH (ref 150–400)
RBC: 3.3 MIL/uL — ABNORMAL LOW (ref 4.22–5.81)
RDW: 15.6 % — ABNORMAL HIGH (ref 11.5–15.5)
WBC: 37 10*3/uL — ABNORMAL HIGH (ref 4.0–10.5)
nRBC: 0.5 % — ABNORMAL HIGH (ref 0.0–0.2)

## 2022-10-13 LAB — GLUCOSE, PLEURAL OR PERITONEAL FLUID: Glucose, Fluid: 64 mg/dL

## 2022-10-13 LAB — ALBUMIN, PLEURAL OR PERITONEAL FLUID: Albumin, Fluid: 1.9 g/dL

## 2022-10-13 LAB — LACTIC ACID, PLASMA: Lactic Acid, Venous: >9 mmol/L (ref 0.5–1.9)

## 2022-10-13 LAB — MRSA NEXT GEN BY PCR, NASAL: MRSA by PCR Next Gen: NOT DETECTED

## 2022-10-13 LAB — PROTEIN, PLEURAL OR PERITONEAL FLUID: Total protein, fluid: 4 g/dL

## 2022-10-13 LAB — CORTISOL: Cortisol, Plasma: 81.1 ug/dL

## 2022-10-13 LAB — MAGNESIUM: Magnesium: 2.6 mg/dL — ABNORMAL HIGH (ref 1.7–2.4)

## 2022-10-13 MED ORDER — GLYCOPYRROLATE 1 MG PO TABS: 1.0000 mg | ORAL_TABLET | ORAL | Status: DC | PRN

## 2022-10-13 MED ORDER — DEXTROSE 50 % IV SOLN
12.5000 g | INTRAVENOUS | Status: AC
  Administered 2022-10-13: 12.5 g via INTRAVENOUS
  Filled 2022-10-13: qty 50

## 2022-10-13 MED ORDER — POLYVINYL ALCOHOL 1.4 % OP SOLN: 1.0000 [drp] | Freq: Four times a day (QID) | OPHTHALMIC | Status: DC | PRN

## 2022-10-13 MED ORDER — ACETAMINOPHEN 650 MG RE SUPP: 650.0000 mg | Freq: Four times a day (QID) | RECTAL | Status: DC | PRN

## 2022-10-13 MED ORDER — GLYCOPYRROLATE 0.2 MG/ML IJ SOLN: 0.2000 mg | INTRAMUSCULAR | Status: DC | PRN

## 2022-10-13 MED ORDER — ACETAMINOPHEN 325 MG PO TABS: 650.0000 mg | ORAL_TABLET | Freq: Four times a day (QID) | ORAL | Status: DC | PRN

## 2022-10-13 MED ORDER — LACTATED RINGERS IV BOLUS
1000.0000 mL | Freq: Once | INTRAVENOUS | Status: AC
  Administered 2022-10-13: 1000 mL via INTRAVENOUS

## 2022-10-13 MED ORDER — SODIUM CHLORIDE 0.9 % IV SOLN: INTRAVENOUS | Status: DC

## 2022-10-13 MED ORDER — DEXTROSE IN LACTATED RINGERS 5 % IV SOLN: INTRAVENOUS | Status: DC

## 2022-10-13 MED ORDER — MORPHINE BOLUS VIA INFUSION
5.0000 mg | INTRAVENOUS | Status: DC | PRN
  Administered 2022-10-13: 5 mg via INTRAVENOUS

## 2022-10-13 MED ORDER — MORPHINE 100MG IN NS 100ML (1MG/ML) PREMIX INFUSION
0.0000 mg/h | INTRAVENOUS | Status: DC
  Administered 2022-10-13: 5 mg/h via INTRAVENOUS
  Filled 2022-10-13: qty 100

## 2022-10-13 MED ORDER — ARTIFICIAL TEARS OPHTHALMIC OINT
TOPICAL_OINTMENT | Freq: Every evening | OPHTHALMIC | Status: DC | PRN
  Administered 2022-10-13: 1 via OPHTHALMIC
  Filled 2022-10-13: qty 3.5

## 2022-10-13 MED ORDER — SODIUM BICARBONATE 8.4 % IV SOLN
100.0000 meq | Freq: Once | INTRAVENOUS | Status: AC
  Administered 2022-10-13: 100 meq via INTRAVENOUS
  Filled 2022-10-13: qty 100

## 2022-10-14 LAB — BODY FLUID CULTURE W GRAM STAIN

## 2022-10-14 LAB — CULTURE, RESPIRATORY W GRAM STAIN: Culture: NO GROWTH

## 2022-10-14 LAB — CULTURE, BLOOD (ROUTINE X 2): Special Requests: ADEQUATE

## 2022-10-14 LAB — CYTOLOGY - NON PAP

## 2022-10-15 LAB — CULTURE, BLOOD (ROUTINE X 2): Special Requests: ADEQUATE

## 2022-10-15 LAB — TRIGLYCERIDES, BODY FLUIDS: Triglycerides, Fluid: 49 mg/dL

## 2022-10-15 LAB — BODY FLUID CULTURE W GRAM STAIN: Culture: NO GROWTH

## 2022-10-15 LAB — CULTURE, RESPIRATORY W GRAM STAIN

## 2022-10-16 LAB — BODY FLUID CULTURE W GRAM STAIN

## 2022-11-08 ENCOUNTER — Ambulatory Visit (HOSPITAL_COMMUNITY): Payer: Medicare Other

## 2022-11-08 ENCOUNTER — Encounter (HOSPITAL_COMMUNITY): Payer: Self-pay

## 2022-11-09 NOTE — Progress Notes (Signed)
NAME:  BRECKIN BOGUE, MRN:  [id number], DOB:  Apr 06, 1926, LOS: 2 ADMISSION DATE:  10/10/2022, CONSULTATION DATE:  5/4 REFERRING MD:  Dr. Sherryll Burger, CHIEF COMPLAINT:  Septic shock   History of Present Illness:  89 yo male from SN[facility] to [facility] with poor appetite and abdominal distention.  KUB concerning for ileus and CT abd for possible SBO.  Also found to have new ascites.  Seen by GI and surgery.  Developed fever and leukocytosis.  Started on Abx for sepsis with concern for possible SBP.  Became hypotensive and started on pressors.  Transferred to [facility].  Had respiratory distress and required intubation.  Pertinent  Medical History  OA, Melanoma, Prostate cancer, GERD, Hepatitis, Hypothyroidism, Memory loss  Significant Hospital Events: Including procedures, antibiotic start and stop dates in addition to other pertinent events   5/3 patient w/ SBO; admitted to [facility] 5/4 worsening sepsis; concern for sbp given ascites; on levo; transferring to [facility] and intubated  Interim History / Subjective:  Hx from chart and medical team.  Objective   Blood pressure (!) 88/34, pulse (!) 114, temperature (!) 97.3 F (36.3 C), resp. rate (!) 0, height 5\' 6"  (1.676 m), weight 70.4 kg, SpO2 95 %.    Vent Mode: PRVC FiO2 (%):  [60 %-100 %] 60 % Set Rate:  [24 bmp] 24 bmp PEEP:  [5 cmH20] 5 cmH20 Plateau Pressure:  [18 cmH20-20 cmH20] 20 cmH20   Intake/Output Summary (Last 24 hours) at 10/10/2022 1028 Last data filed at 11/08/2022 0945 Gross per 24 hour  Intake 2622.86 ml  Output 20 ml  Net 2602.86 ml   Filed Weights   10/12/22 0101 10/12/22 1824 10/14/2022 0134  Weight: 72.9 kg 75.8 kg 70.4 kg    Examination:  General - critically ill elderly M intubated NAD  Neuro: unresponsive, not sedated  ENT - ETT secure pink tacky mm  Cardiac - tachycardic  Chest - mechanically ventilated  Abdomen - distended abdomen  Extremities - arthritic changes, edema  Skin -pale   Resolved Hospital Problem list      Assessment & Plan:   Acute encephalopathy  Acute hypoxic respiratory failure Aspiration pneumonitis, suspected  Septic shock SBP Ascites Severe metabolic acidosis with elevated AG AKI Transaminitis / shock liver  Lactic acidosis Anemia of critical illness Thrombocytosis  Hypothyroidism  Hypocalcemia Hyperphosphatemia Hypermagnesemia  Sclerotic spinal lesions, concern for malignancy  Hx GERD Hx Melanoma Hx Prostate cancer  Hx Basal cell carcinoma  Hx arthritis P -despite aggressive and supportive care measures the patient continued to decline. -decision to compassionately transitioning to comfort care, see IPAL note   Best Practice (right click and "Reselect all SmartList Selections" daily)   Diet/type: NPO DVT prophylaxis: prophylactic heparin  GI prophylaxis: PPI Lines: Central line Foley:  Yes, and it is still needed Code Status:  full code Last date of multidisciplinary goals of care discussion [updated pt's family by phone.  His birthday is on [birth year].  Family plans to come in on 10/13/22 and reassess status]  Labs   CBC: Recent Labs  Lab 10/20/2022 1849 10/12/22 0541 10/22/2022 0053 10/22/2022 0455 10/14/2022 0637  WBC 18.7* 19.2*  --  37.0*  --   NEUTROABS 16.8*  --   --   --   --   HGB 9.7* 8.8* 9.9* 9.1* 9.9*  HCT 29.8* 27.4* 29.0* 29.8* 29.0*  MCV 83.0 84.0  --  90.3  --   PLT 474* 437*  --  422*  --  Basic Metabolic Panel: Recent Labs  Lab 11/06/2022 1849 10/12/22 0541 10/27/2022 0053 10/27/2022 0455 10/12/2022 0637  NA 132* 133* 135 135 132*  K 3.4* 3.5 4.0 4.5 4.6  CL 96* 100  --  101  --   CO2 25 23  --  12*  --   GLUCOSE 169* 109*  --  94  --   BUN 23 20  --  25*  --   CREATININE 1.46* 1.32*  --  2.40*  --   CALCIUM 8.2* 8.0*  --  8.2*  --   MG  --  2.1  --  2.6*  --   PHOS  --  3.3  --  6.6*  --    GFR: Estimated Creatinine Clearance: 16.6 mL/min (A) (by C-G formula based on SCr of 2.4 mg/dL (H)). Recent Labs  Lab 10/30/2022 1849  10/12/22 0541 10/12/22 1323 10/12/22 1554 10/20/2022 0455  WBC 18.7* 19.2*  --   --  37.0*  LATICACIDVEN  --   --  2.3* 3.3* >9.0*    Liver Function Tests: Recent Labs  Lab 10/21/2022 1849 10/12/22 0541 10/21/2022 0455  AST 39 39 798*  ALT 29 25 326*  ALKPHOS 219* 199* 287*  BILITOT 1.8* 2.2* 3.6*  PROT 6.3* 5.7* 6.2*  ALBUMIN 2.6* 2.3* 2.7*   No results for input(s): "LIPASE", "AMYLASE" in the last 168 hours. No results for input(s): "AMMONIA" in the last 168 hours.  ABG    Component Value Date/Time   PHART 6.973 (LL) 11/05/2022 0637   PCO2ART 31.2 (L) 10/15/2022 0637   PO2ART 195 (H) 10/21/2022 0637   HCO3 7.2 (L) 10/17/2022 0637   TCO2 8 (L) 11/03/2022 0637   ACIDBASEDEF 23.0 (H) 10/25/2022 0637   O2SAT 99 11/04/2022 0637     Coagulation Profile: No results for input(s): "INR", "PROTIME" in the last 168 hours.  Cardiac Enzymes: No results for input(s): "CKTOTAL", "CKMB", "CKMBINDEX", "TROPONINI" in the last 168 hours.  HbA1C: No results found for: "HGBA1C"  CBG: Recent Labs  Lab 10/23/2022 0345 11/04/2022 0413 10/24/2022 0611 10/28/2022 0648 10/12/2022 0720  GLUCAP 56* 109* 57* 92 89    CRITICAL CARE Performed by: Lanier Clam   Total critical care time: 35 minutes  Critical care time was exclusive of separately billable procedures and treating other patients. Critical care was necessary to treat or prevent imminent or life-threatening deterioration.  Critical care was time spent personally by me on the following activities: development of treatment plan with patient and/or surrogate as well as nursing, discussions with consultants, evaluation of patient's response to treatment, examination of patient, obtaining history from patient or surrogate, ordering and performing treatments and interventions, ordering and review of laboratory studies, ordering and review of radiographic studies, pulse oximetry and re-evaluation of patient's condition.  Tessie Fass MSN,  AGACNP-BC [facility] Pulmonary/Critical Care Medicine Amion for pager  10/26/2022, 10:28 AM

## 2022-11-09 NOTE — Death Summary Note (Signed)
DEATH SUMMARY   Patient Details  Name: Evan Miller MRN: [id number] DOB: Feb 01, 1926  Admission/Discharge Information   Admit Date:  10/25/2022  Date of Death: Date of Death: 10/27/22  Time of Death: Time of Death: 0945  Length of Stay: 2  Referring Physician: Elenore Paddy, NP   Reason(s) for Hospitalization  Acute septic encephalopathy Acute hypoxic respiratory failure Aspiration pneumonia Septic shock due to UTI Proteus bacteremia  Metabolic acidosis Acute kidney injury due to ischemic ATN Shock liver Lactic acidosis Anemia of critical illness Hypocalcemia/hyperphosphatemia/hypermagnesemia DNR status  Diagnoses  Preliminary cause of death: Withdrawal of care due to multisystem organ failure in the setting of septic shock Secondary Diagnoses (including complications and co-morbidities):  Principal Problem:   Small bowel obstruction (HCC) Active Problems:   Acquired hypothyroidism   Leukocytosis   Thrombocytosis   Hyponatremia   Hypokalemia   Hypoalbuminemia due to protein-calorie malnutrition (HCC)   Hyperglycemia   Ascites   Septic shock (HCC)   Acute respiratory failure (HCC)   Goals of care, counseling/discussion   Encephalopathy acute   Metabolic acidosis   DNR (do not resuscitate) discussion   Brief Hospital Course (including significant findings, care, treatment, and services provided and events leading to death)  Evan Miller is a 89 y.o. year old male  from SNF to [facility] with poor appetite and abdominal distention. KUB concerning for ileus and CT abd for possible SBO. Also found to have new ascites. Seen by GI and surgery. Developed fever and leukocytosis. Started on Abx for sepsis with concern for possible UTI/aspiration PNA. Became hypotensive and started on pressors. Transferred to [facility]. Had respiratory distress and required intubation.  Patient was continued on broad-spectrum antibiotics, his blood culture grew Proteus, sensitivities were pending.   As he continued to require increasing vasopressor support, patient's family decided to keep him DNR and proceed with comfort care.  Comfort care orders were written, patient died on 10/27/22 at 9:45 AM Patient family was notified.  Pertinent Labs and Studies  Significant Diagnostic Studies DG CHEST PORT 1 VIEW  Result Date: Oct 27, 2022 CLINICAL DATA:  [id number]. Central venous catheter in place. [id number]. Encounter for orogastric tube placement. EXAM: PORTABLE ABDOMEN - 1 VIEW; PORTABLE CHEST - 1 VIEW COMPARISON:  Portable chest earlier today at 11:05 a.m., abdomen film earlier today at 3:11 p.m., and CT abdomen and pelvis yesterday at 8:40 p.m. FINDINGS: Portable chest 11:28 p.m.: ETT interval insertion with tip 3.5 cm from the carina. NGT interval insertion with tip abutting the far wall of the proximal body of stomach. Left IJ central line terminates at about the superior cavoatrial junction. There is no pneumothorax. There is mild generalized increased interstitial edema, increased minimal pleural effusions. There are increased patchy faint ground-glass opacities of the lungs consistent with ground-glass edema, pneumonia or combination. The cardiac size is upper normal. There is mild central vascular prominence. Aortic tortuosity calcification with stable mediastinum. Osteopenia and thoracic spondylosis. Abdomen supine single AP, 11:29 p.m.: There is increased small bowel dilatation upper to mid abdomen up to 5 cm, previously 4 cm. Worsening small-bowel obstruction is suspected. There is no appreciable pneumatosis. No supine evidence of free air. No pathologic calcifications are seen. There is mild inward displacement of the bowel due to moderate ascites which was seen on the CT. There is reverse S shaped lumbar scoliosis and advanced degenerative change of the lumbar spine. IMPRESSION: 1. ETT and NGT interval insertions. 2. Increased interstitial edema and minimal pleural effusions. 3. Increased patchy faint  ground-glass opacities of the lungs consistent with ground-glass edema, pneumonia or combination. 4. Increased small bowel dilatation upper to mid abdomen up to 5 cm, previously 4 cm. Worsening small-bowel obstruction suspected. 5. Mild inward displacement of the bowel due to moderate ascites. Electronically Signed   By: Almira Bar M.D.   On: 10/27/2022 00:08   DG Abd Portable 1V  Result Date: 10/31/2022 CLINICAL DATA:  [id number]. Central venous catheter in place. [id number]. Encounter for orogastric tube placement. EXAM: PORTABLE ABDOMEN - 1 VIEW; PORTABLE CHEST - 1 VIEW COMPARISON:  Portable chest earlier today at 11:05 a.m., abdomen film earlier today at 3:11 p.m., and CT abdomen and pelvis yesterday at 8:40 p.m. FINDINGS: Portable chest 11:28 p.m.: ETT interval insertion with tip 3.5 cm from the carina. NGT interval insertion with tip abutting the far wall of the proximal body of stomach. Left IJ central line terminates at about the superior cavoatrial junction. There is no pneumothorax. There is mild generalized increased interstitial edema, increased minimal pleural effusions. There are increased patchy faint ground-glass opacities of the lungs consistent with ground-glass edema, pneumonia or combination. The cardiac size is upper normal. There is mild central vascular prominence. Aortic tortuosity calcification with stable mediastinum. Osteopenia and thoracic spondylosis. Abdomen supine single AP, 11:29 p.m.: There is increased small bowel dilatation upper to mid abdomen up to 5 cm, previously 4 cm. Worsening small-bowel obstruction is suspected. There is no appreciable pneumatosis. No supine evidence of free air. No pathologic calcifications are seen. There is mild inward displacement of the bowel due to moderate ascites which was seen on the CT. There is reverse S shaped lumbar scoliosis and advanced degenerative change of the lumbar spine. IMPRESSION: 1. ETT and NGT interval insertions. 2. Increased  interstitial edema and minimal pleural effusions. 3. Increased patchy faint ground-glass opacities of the lungs consistent with ground-glass edema, pneumonia or combination. 4. Increased small bowel dilatation upper to mid abdomen up to 5 cm, previously 4 cm. Worsening small-bowel obstruction suspected. 5. Mild inward displacement of the bowel due to moderate ascites. Electronically Signed   By: Almira Bar M.D.   On: 10/18/2022 00:08   DG Abd 1 View  Result Date: 10/12/2022 CLINICAL DATA:  NG tube placement EXAM: ABDOMEN - 1 VIEW COMPARISON:  CT 10/30/2022 FINDINGS: NG tube tip in the stomach. Mild gaseous distention of central bowel loops. IMPRESSION: NG tube tip in the stomach. Electronically Signed   By: Charlett Nose M.D.   On: 10/12/2022 19:46   DG CHEST PORT 1 VIEW  Result Date: 10/12/2022 CLINICAL DATA:  Dyspnea. EXAM: PORTABLE CHEST 1 VIEW COMPARISON:  09/28/2022. FINDINGS: 1105 hours. Low lung volumes accentuate the pulmonary vasculature and cardiomediastinal silhouette. Unchanged bilateral reticular opacities, consistent with chronic scarring. No consolidation or pulmonary edema. No pleural effusion or pneumothorax. Visualized bones and upper abdomen are unremarkable. IMPRESSION: No evidence of acute cardiopulmonary disease. Electronically Signed   By: Orvan Falconer M.D.   On: 10/12/2022 12:11   CT ABDOMEN PELVIS W CONTRAST  Result Date: 10/16/2022 CLINICAL DATA:  Bowel obstruction suspected.  Prostate cancer. EXAM: CT ABDOMEN AND PELVIS WITH CONTRAST TECHNIQUE: Multidetector CT imaging of the abdomen and pelvis was performed using the standard protocol following bolus administration of intravenous contrast. RADIATION DOSE REDUCTION: This exam was performed according to the departmental dose-optimization program which includes automated exposure control, adjustment of the mA and/or kV according to patient size and/or use of iterative reconstruction technique. CONTRAST:  80mL OMNIPAQUE IOHEXOL  300 MG/ML  SOLN COMPARISON:  None Available. FINDINGS: Lower chest: There is a trace left pleural effusion. Hepatobiliary: No focal liver abnormality is seen. No gallstones, gallbladder wall thickening, or biliary dilatation. Pancreas: Unremarkable. No pancreatic ductal dilatation or surrounding inflammatory changes. Spleen: Normal in size without focal abnormality. Calcified granulomas are present. Adrenals/Urinary Tract: There is mild bilateral hydroureteronephrosis without obstructing calculus identified. Bladder and adrenal glands are within normal limits. Stomach/Bowel: There are multiple dilated loops of small bowel in the central abdomen measuring up to 3.7 cm in diameter. No definite transition point visualized. The colon is nondilated. There is a large amount of stool in the rectum. The appendix is normal in size. Stomach is decompressed. A small hiatal hernia is present. Vascular/Lymphatic: Aorta and IVC are normal in size. No enlarged lymph nodes are identified. Reproductive: Prostate gland appears within normal limits. Other: There is large volume ascites throughout the abdomen and pelvis. There is some peritoneal wall enhancement in the pelvis and upper abdomen, indeterminate. There is no focal hernia. Musculoskeletal: Severe degenerative changes affect the spine. There are scattered rounded sclerotic lesions throughout the osseous structures worrisome for metastatic disease. IMPRESSION: 1. Multiple dilated loops of small bowel in the central abdomen worrisome for small bowel obstruction. No definite transition point visualized. 2. Large volume ascites. There is peritoneal wall enhancement in the pelvis and upper abdomen, indeterminate. 3. Mild bilateral hydroureteronephrosis without obstructing calculus identified. 4. Trace left pleural effusion. 5. Sclerotic osseous lesions worrisome for metastatic disease. Electronically Signed   By: Darliss Cheney M.D.   On: 10/28/2022 21:03   DG Chest 2  View  Result Date: 09/28/2022 CLINICAL DATA:  89 year old male with cough, fatigue. EXAM: CHEST - 2 VIEW COMPARISON:  Chest radiographs 09/11/2022 and earlier. FINDINGS: Chronic exaggerated thoracic kyphosis, with some progression since 2018. Subsequent increased AP dimension to the lungs but otherwise shallow appearing lung volumes. Mediastinal contours remain within normal limits. Coarse bilateral pulmonary reticular interstitial opacity does not appear significantly changed, with scattered areas of punctate postinflammatory appearing calcification again noted. No superimposed pneumothorax, pulmonary edema, pleural effusion or confluent lung opacity. No acute osseous abnormality identified. Probable multilevel thoracic ankylosis in addition to exaggerated kyphosis. Negative visible bowel gas. 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 No acute cardiopulmonary abnormality. Electronically Signed   By: Odessa Fleming M.D.   On: 09/28/2022 12:23    Microbiology Recent Results (from the past 240 hour(s))  Culture, blood (Routine X 2) w Reflex to ID Panel     Status: None (Preliminary result)   Collection Time: 10/12/22  1:54 PM   Specimen: Right Antecubital; Blood  Result Value Ref Range Status   Specimen Description RIGHT ANTECUBITAL BLOOD  Final   Special Requests   Final    Blood Culture adequate volume BOTTLES DRAWN AEROBIC ONLY   Culture  Setup Time   Final    GRAM NEGATIVE RODS Gram Stain Report Called to,Read Back By and Verified With: D. WHITE @ 1006 BY STEPHTR November 11, 2022 PATIENT DISCHARGED OR EXPIRED Performed at [facility], [address], [place], [place] [address]    Culture GRAM NEGATIVE RODS  Final   Report Status PENDING  Incomplete  Culture, blood (Routine X 2) w Reflex to ID Panel     Status: None (Preliminary result)   Collection Time: 10/12/22  1:54 PM   Specimen: BLOOD RIGHT HAND  Result Value Ref Range Status    Specimen Description   Final  BLOOD RIGHT HAND BOTTLES DRAWN AEROBIC ONLY Performed at [facility], [address] [place], [place] [address]    Special Requests   Final    Blood Culture adequate volume Performed at [facility], [address], [place], [place] [address]    Culture  Setup Time   Final    GRAM NEGATIVE RODS Gram Stain Report Called to,Read Back By and Verified With:  D.WHITE @ 1006 BY STEPHTR 2022-11-03 Performed at [facility], [address], [place], [place] [address]    Culture GRAM NEGATIVE RODS  Final   Report Status PENDING  Incomplete  Blood Culture ID Panel (Reflexed)     Status: Abnormal   Collection Time: 10/12/22  1:54 PM  Result Value Ref Range Status   Enterococcus faecalis NOT DETECTED NOT DETECTED Final   Enterococcus Faecium NOT DETECTED NOT DETECTED Final   Listeria monocytogenes NOT DETECTED NOT DETECTED Final   Staphylococcus species NOT DETECTED NOT DETECTED Final   Staphylococcus aureus (BCID) NOT DETECTED NOT DETECTED Final   Staphylococcus epidermidis NOT DETECTED NOT DETECTED Final   Staphylococcus lugdunensis NOT DETECTED NOT DETECTED Final   Streptococcus species NOT DETECTED NOT DETECTED Final   Streptococcus agalactiae NOT DETECTED NOT DETECTED Final   Streptococcus pneumoniae NOT DETECTED NOT DETECTED Final   Streptococcus pyogenes NOT DETECTED NOT DETECTED Final   A.calcoaceticus-baumannii NOT DETECTED NOT DETECTED Final   Bacteroides fragilis NOT DETECTED NOT DETECTED Final   Enterobacterales DETECTED (A) NOT DETECTED Final    Comment: Enterobacterales represent a large order of gram negative bacteria, not a single organism. PATIENT DISCHARGED OR EXPIRED PER [facility]    Enterobacter cloacae complex NOT DETECTED NOT DETECTED Final   Escherichia coli NOT DETECTED NOT DETECTED Final   Klebsiella aerogenes NOT DETECTED NOT DETECTED Final   Klebsiella oxytoca NOT DETECTED NOT DETECTED Final   Klebsiella pneumoniae NOT  DETECTED NOT DETECTED Final   Proteus species DETECTED (A) NOT DETECTED Final    Comment: PATIENT DISCHARGED OR EXPIRED PER [facility]    Salmonella species NOT DETECTED NOT DETECTED Final   Serratia marcescens NOT DETECTED NOT DETECTED Final   Haemophilus influenzae NOT DETECTED NOT DETECTED Final   Neisseria meningitidis NOT DETECTED NOT DETECTED Final   Pseudomonas aeruginosa NOT DETECTED NOT DETECTED Final   Stenotrophomonas maltophilia NOT DETECTED NOT DETECTED Final   Candida albicans NOT DETECTED NOT DETECTED Final   Candida auris NOT DETECTED NOT DETECTED Final   Candida glabrata NOT DETECTED NOT DETECTED Final   Candida krusei NOT DETECTED NOT DETECTED Final   Candida parapsilosis NOT DETECTED NOT DETECTED Final   Candida tropicalis NOT DETECTED NOT DETECTED Final   Cryptococcus neoformans/gattii NOT DETECTED NOT DETECTED Final   CTX-M ESBL NOT DETECTED NOT DETECTED Final   Carbapenem resistance IMP NOT DETECTED NOT DETECTED Final   Carbapenem resistance KPC NOT DETECTED NOT DETECTED Final   Carbapenem resistance NDM NOT DETECTED NOT DETECTED Final   Carbapenem resist OXA 48 LIKE NOT DETECTED NOT DETECTED Final   Carbapenem resistance VIM NOT DETECTED NOT DETECTED Final    Comment: Performed at [facility], [address], [place], [place] [address]  MRSA Next Gen by PCR, Nasal     Status: None   Collection Time: 10/12/22  9:00 PM   Specimen: Nasal Mucosa; Nasal Swab  Result Value Ref Range Status   MRSA by PCR Next Gen NOT DETECTED NOT DETECTED Final    Comment: (NOTE) The GeneXpert MRSA Assay (  FDA approved for NASAL specimens only), is one component of a comprehensive MRSA colonization surveillance program. It is not intended to diagnose MRSA infection nor to guide or monitor treatment for MRSA infections. Test performance is not FDA approved in patients less than 54 years old. Performed at [facility], [address] [place], [place] [address]   MRSA  Next Gen by PCR, Nasal     Status: None   Collection Time: 10/12/22 10:14 PM   Specimen: Nasal Mucosa; Nasal Swab  Result Value Ref Range Status   MRSA by PCR Next Gen NOT DETECTED NOT DETECTED Final    Comment: (NOTE) The GeneXpert MRSA Assay (FDA approved for NASAL specimens only), is one component of a comprehensive MRSA colonization surveillance program. It is not intended to diagnose MRSA infection nor to guide or monitor treatment for MRSA infections. Test performance is not FDA approved in patients less than 71 years old. Performed at [facility], [address] [place], [place] [address]   Body fluid culture w Gram Stain     Status: None (Preliminary result)   Collection Time: 10/23/2022  1:27 AM   Specimen: Paracentesis; Body Fluid  Result Value Ref Range Status   Specimen Description PARACENTESIS FLUID  Final   Special Requests NONE  Final   Gram Stain   Final    WBC PRESENT,BOTH PMN AND MONONUCLEAR NO ORGANISMS SEEN CYTOSPIN SMEAR Performed at [facility], [address], [place], [place] [address]    Culture PENDING  Incomplete   Report Status PENDING  Incomplete  Culture, Respiratory w Gram Stain     Status: None (Preliminary result)   Collection Time: 11/04/2022  4:34 AM   Specimen: Endotracheal; Respiratory  Result Value Ref Range Status   Specimen Description ENDOTRACHEAL  Final   Special Requests ASPIRATE  Final   Gram Stain   Final    FEW WBC PRESENT, PREDOMINANTLY MONONUCLEAR NO ORGANISMS SEEN Performed at [facility], [address] [place], [place] [address]    Culture PENDING  Incomplete   Report Status PENDING  Incomplete    Lab Basic Metabolic Panel: Recent Labs  Lab 11/07/2022 1849 10/12/22 0541 10/20/2022 0053 10/16/2022 0455 10/09/2022 0637  NA 132* 133* 135 135 132*  K 3.4* 3.5 4.0 4.5 4.6  CL 96* 100  --  101  --   CO2 25 23  --  12*  --   GLUCOSE 169* 109*  --  94  --   BUN 23 20  --  25*  --   CREATININE 1.46* 1.32*   --  2.40*  --   CALCIUM 8.2* 8.0*  --  8.2*  --   MG  --  2.1  --  2.6*  --   PHOS  --  3.3  --  6.6*  --    Liver Function Tests: Recent Labs  Lab 10/17/2022 1849 10/12/22 0541 10/29/2022 0455  AST 39 39 798*  ALT 29 25 326*  ALKPHOS 219* 199* 287*  BILITOT 1.8* 2.2* 3.6*  PROT 6.3* 5.7* 6.2*  ALBUMIN 2.6* 2.3* 2.7*   No results for input(s): "LIPASE", "AMYLASE" in the last 168 hours. No results for input(s): "AMMONIA" in the last 168 hours. CBC: Recent Labs  Lab 11/08/2022 1849 10/12/22 0541 11/08/2022 0053 10/19/2022 0455 11/01/2022 0637  WBC 18.7* 19.2*  --  37.0*  --   NEUTROABS 16.8*  --   --   --   --  HGB 9.7* 8.8* 9.9* 9.1* 9.9*  HCT 29.8* 27.4* 29.0* 29.8* 29.0*  MCV 83.0 84.0  --  90.3  --   PLT 474* 437*  --  422*  --    Cardiac Enzymes: No results for input(s): "CKTOTAL", "CKMB", "CKMBINDEX", "TROPONINI" in the last 168 hours. Sepsis Labs: Recent Labs  Lab 11/03/2022 1849 10/12/22 0541 10/12/22 1323 10/12/22 1554 11/08/2022 0455  WBC 18.7* 19.2*  --   --  37.0*  LATICACIDVEN  --   --  2.3* 3.3* >9.0*    Procedures/Operations     Kody Brandl 10/25/2022, 3:33 PM

## 2022-11-09 NOTE — Progress Notes (Signed)
eLink Physician-Brief Progress Note Patient Name: Evan Miller DOB: [birth year] MRN: [id number]   Date of Service  11/08/2022  HPI/Events of Note  Lactic acid now greater than 9 with increasing vasopressor need.    eICU Interventions  Bolus LR now Repeat abg     Intervention Category Major Interventions: Shock - evaluation and management  Henry Russel, P 10/25/2022, 6:13 AM

## 2022-11-09 NOTE — Progress Notes (Signed)
eLink Physician-Brief Progress Note Patient Name: Evan Miller DOB: Jul 11, 1925 MRN: [id number]   Date of Service  10/25/2022  HPI/Events of Note  Hypoglycemia.  eICU Interventions  D5LR 50 cc/hr     Intervention Category Intermediate Interventions: Other:  Henry Russel, Demetrius Charity 11/04/2022, 6:23 AM

## 2022-11-09 NOTE — Procedures (Signed)
Paracentesis Procedure Note  Evan Miller  [id number]  24-May-1926  Date:10/30/2022  Time:12:06 AM   Provider Performing:Jenisa Monty D Suzie Portela    Procedure: Paracentesis with imaging guidance (40981)  Indication(s) Ascites  Consent Risks of the procedure as well as the alternatives and risks of each were explained to the patient and/or caregiver.  Consent for the procedure was obtained and is signed in the bedside chart  Anesthesia Topical only with 1% lidocaine    Time Out Verified patient identification, verified procedure, site/side was marked, verified correct patient position, special equipment/implants available, medications/allergies/relevant history reviewed, required imaging and test results available.   Sterile Technique Maximal sterile technique including full sterile barrier drape, hand hygiene, sterile gown, sterile gloves, mask, hair covering, sterile ultrasound probe cover (if used).   Procedure Description Ultrasound used to identify appropriate peritoneal anatomy for placement and overlying skin marked.  Area of drainage cleaned and draped in sterile fashion. Lidocaine was used to anesthetize the skin and subcutaneous tissue.  1000 cc's of yellowish/green cloudy appearing fluid was drained. Catheter then removed and bandaid applied to site.   Complications/Tolerance None; patient tolerated the procedure well.   EBL Minimal   Specimen(s) Peritoneal fluid  JD Anselm Lis [facility] 10/17/2022, 12:07 AM  Please see [email or web address] for pager details.  From 7A-7P if no response, please call [phone]. After hours, please call ELink [phone].

## 2022-11-09 NOTE — Progress Notes (Signed)
eLink Physician-Brief Progress Note Patient Name: Evan Miller DOB: 1926-01-10 MRN: [id number]   Date of Service  09-Nov-2022  HPI/Events of Note  Abg 6.97/31/195/8  eICU Interventions  2 amps bicarb ordered     Intervention Category Major Interventions: Acid-Base disturbance - evaluation and management  Henry Russel, P 09-Nov-2022, 6:44 AM

## 2022-11-09 NOTE — Progress Notes (Signed)
Noted compassionate extubation after goals of care discussion. CCS will remain available as needed.    Hosie Spangle, PA-C [facility] Please see Amion for pager number during day hours 7:00am-4:30pm

## 2022-11-09 NOTE — Progress Notes (Signed)
RT compassionately extubated patient per MD order and family wishes.

## 2022-11-09 NOTE — IPAL (Signed)
  Interdisciplinary Goals of Care Family Meeting   Date carried out: 10/14/2022  Location of the meeting: Nurse Practitioner, Bedside Registered Nurse, and Family Member or next of kin  Member's involved: Nurse Practitioner, Bedside Registered Nurse, and Family Member or next of kin  Durable Power of Attorney or acting medical decision maker: Evan Miller, daughter     Discussion: We discussed goals of care for Evan Miller .  Evan Miller is actively dying. We discussed the terminal nature of this critical illness process. Current lines of intervention will be continued, without escalation. This is to hopefully facilitate arrival of family. In event of arrest despite these measures, Evan Miller will not be subjected to ACLS interventions, which I believe would cause suffering without reasonable benefit for overall prognosis.  We will de-escalate labs etc not aimed at comfort, and transition monitor in room to "comfort mode."   When rest of family arrives, we will compassionately liberate from mechanical ventilation and pressors, and wholly transition to comfort care.    Code status:   Code Status: DNR   Disposition: In-patient comfort care when family arrives   Time spent for the meeting: 20 min     Evan Clam, NP 2022-10-14, 8:02 AM

## 2022-11-09 NOTE — Progress Notes (Signed)
eLink Physician-Brief Progress Note Patient Name: Evan Miller DOB: 1926/06/07 MRN: [id number]   Date of Service  10/28/2022  HPI/Events of Note  Spoke to daughter Evan Miller about severity of illness.  I explained illness did not appear to be survivable.  I recommended DNR status.  She requests continued full code status for now.  Family arriving to hospital this am for further discussion.  eICU Interventions       Intervention Category Major Interventions: End of life / care limitation discussion  Henry Russel, Demetrius Charity 10/30/2022, 6:55 AM

## 2022-11-09 DEATH — deceased
